# Patient Record
Sex: Male | Born: 1940 | Race: White | Hispanic: No | Marital: Married | Smoking: Former smoker
Health system: Southern US, Community
[De-identification: ages and names within clinical notes are randomized; demographics above are authoritative.]

## PROBLEM LIST (undated history)

## (undated) DIAGNOSIS — I10 Essential (primary) hypertension: Secondary | ICD-10-CM

## (undated) DIAGNOSIS — I509 Heart failure, unspecified: Secondary | ICD-10-CM

## (undated) DIAGNOSIS — M1990 Unspecified osteoarthritis, unspecified site: Secondary | ICD-10-CM

## (undated) DIAGNOSIS — E669 Obesity, unspecified: Secondary | ICD-10-CM

## (undated) DIAGNOSIS — M545 Low back pain, unspecified: Secondary | ICD-10-CM

## (undated) DIAGNOSIS — E119 Type 2 diabetes mellitus without complications: Secondary | ICD-10-CM

## (undated) DIAGNOSIS — G8929 Other chronic pain: Secondary | ICD-10-CM

## (undated) DIAGNOSIS — E785 Hyperlipidemia, unspecified: Secondary | ICD-10-CM

## (undated) HISTORY — PX: BACK SURGERY: SHX140

## (undated) HISTORY — PX: EYE SURGERY: SHX253

## (undated) HISTORY — PX: LUMBAR DISC SURGERY: SHX700

## (undated) HISTORY — PX: APPENDECTOMY: SHX54

## (undated) HISTORY — PX: TONSILLECTOMY: SUR1361

---

## 2008-08-14 ENCOUNTER — Emergency Department (HOSPITAL_COMMUNITY): Admission: EM | Admit: 2008-08-14 | Discharge: 2008-08-14 | Payer: Self-pay | Admitting: Emergency Medicine

## 2017-06-05 ENCOUNTER — Emergency Department (HOSPITAL_COMMUNITY): Payer: Medicare Other

## 2017-06-05 ENCOUNTER — Inpatient Hospital Stay (HOSPITAL_COMMUNITY)
Admission: EM | Admit: 2017-06-05 | Discharge: 2017-07-13 | DRG: 233 | Disposition: E | Payer: Medicare Other | Attending: Cardiothoracic Surgery | Admitting: Cardiothoracic Surgery

## 2017-06-05 ENCOUNTER — Encounter (HOSPITAL_COMMUNITY): Payer: Self-pay | Admitting: Emergency Medicine

## 2017-06-05 DIAGNOSIS — E1142 Type 2 diabetes mellitus with diabetic polyneuropathy: Secondary | ICD-10-CM | POA: Diagnosis not present

## 2017-06-05 DIAGNOSIS — Z992 Dependence on renal dialysis: Secondary | ICD-10-CM

## 2017-06-05 DIAGNOSIS — Z452 Encounter for adjustment and management of vascular access device: Secondary | ICD-10-CM

## 2017-06-05 DIAGNOSIS — I208 Other forms of angina pectoris: Secondary | ICD-10-CM

## 2017-06-05 DIAGNOSIS — Z66 Do not resuscitate: Secondary | ICD-10-CM | POA: Diagnosis not present

## 2017-06-05 DIAGNOSIS — I509 Heart failure, unspecified: Secondary | ICD-10-CM

## 2017-06-05 DIAGNOSIS — Z6841 Body Mass Index (BMI) 40.0 and over, adult: Secondary | ICD-10-CM | POA: Diagnosis not present

## 2017-06-05 DIAGNOSIS — K8020 Calculus of gallbladder without cholecystitis without obstruction: Secondary | ICD-10-CM | POA: Diagnosis present

## 2017-06-05 DIAGNOSIS — J810 Acute pulmonary edema: Secondary | ICD-10-CM | POA: Diagnosis not present

## 2017-06-05 DIAGNOSIS — Z01818 Encounter for other preprocedural examination: Secondary | ICD-10-CM

## 2017-06-05 DIAGNOSIS — G92 Toxic encephalopathy: Secondary | ICD-10-CM | POA: Diagnosis not present

## 2017-06-05 DIAGNOSIS — Z79899 Other long term (current) drug therapy: Secondary | ICD-10-CM

## 2017-06-05 DIAGNOSIS — N170 Acute kidney failure with tubular necrosis: Secondary | ICD-10-CM | POA: Diagnosis not present

## 2017-06-05 DIAGNOSIS — I5021 Acute systolic (congestive) heart failure: Secondary | ICD-10-CM | POA: Diagnosis not present

## 2017-06-05 DIAGNOSIS — R001 Bradycardia, unspecified: Secondary | ICD-10-CM | POA: Diagnosis not present

## 2017-06-05 DIAGNOSIS — J9602 Acute respiratory failure with hypercapnia: Secondary | ICD-10-CM | POA: Diagnosis not present

## 2017-06-05 DIAGNOSIS — E785 Hyperlipidemia, unspecified: Secondary | ICD-10-CM | POA: Diagnosis present

## 2017-06-05 DIAGNOSIS — G8929 Other chronic pain: Secondary | ICD-10-CM | POA: Diagnosis present

## 2017-06-05 DIAGNOSIS — I214 Non-ST elevation (NSTEMI) myocardial infarction: Principal | ICD-10-CM

## 2017-06-05 DIAGNOSIS — J9690 Respiratory failure, unspecified, unspecified whether with hypoxia or hypercapnia: Secondary | ICD-10-CM

## 2017-06-05 DIAGNOSIS — I130 Hypertensive heart and chronic kidney disease with heart failure and stage 1 through stage 4 chronic kidney disease, or unspecified chronic kidney disease: Secondary | ICD-10-CM | POA: Diagnosis not present

## 2017-06-05 DIAGNOSIS — E1122 Type 2 diabetes mellitus with diabetic chronic kidney disease: Secondary | ICD-10-CM | POA: Diagnosis present

## 2017-06-05 DIAGNOSIS — I4892 Unspecified atrial flutter: Secondary | ICD-10-CM | POA: Diagnosis not present

## 2017-06-05 DIAGNOSIS — E669 Obesity, unspecified: Secondary | ICD-10-CM | POA: Diagnosis not present

## 2017-06-05 DIAGNOSIS — Z01810 Encounter for preprocedural cardiovascular examination: Secondary | ICD-10-CM | POA: Diagnosis not present

## 2017-06-05 DIAGNOSIS — Z7984 Long term (current) use of oral hypoglycemic drugs: Secondary | ICD-10-CM

## 2017-06-05 DIAGNOSIS — Z23 Encounter for immunization: Secondary | ICD-10-CM | POA: Diagnosis not present

## 2017-06-05 DIAGNOSIS — Z22321 Carrier or suspected carrier of Methicillin susceptible Staphylococcus aureus: Secondary | ICD-10-CM

## 2017-06-05 DIAGNOSIS — I10 Essential (primary) hypertension: Secondary | ICD-10-CM | POA: Diagnosis present

## 2017-06-05 DIAGNOSIS — N183 Chronic kidney disease, stage 3 unspecified: Secondary | ICD-10-CM

## 2017-06-05 DIAGNOSIS — I255 Ischemic cardiomyopathy: Secondary | ICD-10-CM | POA: Diagnosis present

## 2017-06-05 DIAGNOSIS — G9340 Encephalopathy, unspecified: Secondary | ICD-10-CM | POA: Diagnosis not present

## 2017-06-05 DIAGNOSIS — J189 Pneumonia, unspecified organism: Secondary | ICD-10-CM | POA: Diagnosis not present

## 2017-06-05 DIAGNOSIS — J811 Chronic pulmonary edema: Secondary | ICD-10-CM

## 2017-06-05 DIAGNOSIS — K567 Ileus, unspecified: Secondary | ICD-10-CM

## 2017-06-05 DIAGNOSIS — I472 Ventricular tachycardia: Secondary | ICD-10-CM | POA: Diagnosis not present

## 2017-06-05 DIAGNOSIS — K5900 Constipation, unspecified: Secondary | ICD-10-CM | POA: Diagnosis present

## 2017-06-05 DIAGNOSIS — J9600 Acute respiratory failure, unspecified whether with hypoxia or hypercapnia: Secondary | ICD-10-CM

## 2017-06-05 DIAGNOSIS — Y95 Nosocomial condition: Secondary | ICD-10-CM | POA: Diagnosis not present

## 2017-06-05 DIAGNOSIS — I2510 Atherosclerotic heart disease of native coronary artery without angina pectoris: Secondary | ICD-10-CM | POA: Diagnosis not present

## 2017-06-05 DIAGNOSIS — D62 Acute posthemorrhagic anemia: Secondary | ICD-10-CM | POA: Diagnosis not present

## 2017-06-05 DIAGNOSIS — R680 Hypothermia, not associated with low environmental temperature: Secondary | ICD-10-CM | POA: Diagnosis not present

## 2017-06-05 DIAGNOSIS — R6521 Severe sepsis with septic shock: Secondary | ICD-10-CM | POA: Diagnosis not present

## 2017-06-05 DIAGNOSIS — K7200 Acute and subacute hepatic failure without coma: Secondary | ICD-10-CM | POA: Diagnosis not present

## 2017-06-05 DIAGNOSIS — I6340 Cerebral infarction due to embolism of unspecified cerebral artery: Secondary | ICD-10-CM | POA: Diagnosis not present

## 2017-06-05 DIAGNOSIS — I350 Nonrheumatic aortic (valve) stenosis: Secondary | ICD-10-CM | POA: Diagnosis not present

## 2017-06-05 DIAGNOSIS — Z951 Presence of aortocoronary bypass graft: Secondary | ICD-10-CM | POA: Diagnosis not present

## 2017-06-05 DIAGNOSIS — A419 Sepsis, unspecified organism: Secondary | ICD-10-CM

## 2017-06-05 DIAGNOSIS — R570 Cardiogenic shock: Secondary | ICD-10-CM | POA: Diagnosis not present

## 2017-06-05 DIAGNOSIS — J939 Pneumothorax, unspecified: Secondary | ICD-10-CM

## 2017-06-05 DIAGNOSIS — Z9289 Personal history of other medical treatment: Secondary | ICD-10-CM

## 2017-06-05 DIAGNOSIS — I25110 Atherosclerotic heart disease of native coronary artery with unstable angina pectoris: Secondary | ICD-10-CM | POA: Diagnosis present

## 2017-06-05 DIAGNOSIS — R34 Anuria and oliguria: Secondary | ICD-10-CM | POA: Diagnosis not present

## 2017-06-05 DIAGNOSIS — I9789 Other postprocedural complications and disorders of the circulatory system, not elsewhere classified: Secondary | ICD-10-CM | POA: Diagnosis not present

## 2017-06-05 DIAGNOSIS — R946 Abnormal results of thyroid function studies: Secondary | ICD-10-CM | POA: Diagnosis present

## 2017-06-05 DIAGNOSIS — Y838 Other surgical procedures as the cause of abnormal reaction of the patient, or of later complication, without mention of misadventure at the time of the procedure: Secondary | ICD-10-CM | POA: Diagnosis not present

## 2017-06-05 DIAGNOSIS — Z978 Presence of other specified devices: Secondary | ICD-10-CM

## 2017-06-05 DIAGNOSIS — J9601 Acute respiratory failure with hypoxia: Secondary | ICD-10-CM

## 2017-06-05 DIAGNOSIS — Z4659 Encounter for fitting and adjustment of other gastrointestinal appliance and device: Secondary | ICD-10-CM

## 2017-06-05 DIAGNOSIS — Z91030 Bee allergy status: Secondary | ICD-10-CM

## 2017-06-05 DIAGNOSIS — D631 Anemia in chronic kidney disease: Secondary | ICD-10-CM | POA: Diagnosis present

## 2017-06-05 DIAGNOSIS — Z7982 Long term (current) use of aspirin: Secondary | ICD-10-CM

## 2017-06-05 DIAGNOSIS — E11319 Type 2 diabetes mellitus with unspecified diabetic retinopathy without macular edema: Secondary | ICD-10-CM | POA: Diagnosis present

## 2017-06-05 DIAGNOSIS — Z833 Family history of diabetes mellitus: Secondary | ICD-10-CM

## 2017-06-05 DIAGNOSIS — Z87891 Personal history of nicotine dependence: Secondary | ICD-10-CM

## 2017-06-05 DIAGNOSIS — I4891 Unspecified atrial fibrillation: Secondary | ICD-10-CM | POA: Diagnosis not present

## 2017-06-05 DIAGNOSIS — E874 Mixed disorder of acid-base balance: Secondary | ICD-10-CM | POA: Diagnosis not present

## 2017-06-05 DIAGNOSIS — R0602 Shortness of breath: Secondary | ICD-10-CM

## 2017-06-05 DIAGNOSIS — I272 Pulmonary hypertension, unspecified: Secondary | ICD-10-CM | POA: Diagnosis present

## 2017-06-05 DIAGNOSIS — N189 Chronic kidney disease, unspecified: Secondary | ICD-10-CM | POA: Diagnosis not present

## 2017-06-05 DIAGNOSIS — J90 Pleural effusion, not elsewhere classified: Secondary | ICD-10-CM

## 2017-06-05 DIAGNOSIS — G4733 Obstructive sleep apnea (adult) (pediatric): Secondary | ICD-10-CM | POA: Diagnosis present

## 2017-06-05 DIAGNOSIS — Z419 Encounter for procedure for purposes other than remedying health state, unspecified: Secondary | ICD-10-CM

## 2017-06-05 DIAGNOSIS — Z8249 Family history of ischemic heart disease and other diseases of the circulatory system: Secondary | ICD-10-CM

## 2017-06-05 DIAGNOSIS — I2089 Other forms of angina pectoris: Secondary | ICD-10-CM

## 2017-06-05 DIAGNOSIS — Z5329 Procedure and treatment not carried out because of patient's decision for other reasons: Secondary | ICD-10-CM | POA: Diagnosis not present

## 2017-06-05 HISTORY — DX: Low back pain: M54.5

## 2017-06-05 HISTORY — DX: Heart failure, unspecified: I50.9

## 2017-06-05 HISTORY — DX: Unspecified osteoarthritis, unspecified site: M19.90

## 2017-06-05 HISTORY — DX: Hyperlipidemia, unspecified: E78.5

## 2017-06-05 HISTORY — DX: Obesity, unspecified: E66.9

## 2017-06-05 HISTORY — DX: Type 2 diabetes mellitus without complications: E11.9

## 2017-06-05 HISTORY — DX: Other chronic pain: G89.29

## 2017-06-05 HISTORY — DX: Essential (primary) hypertension: I10

## 2017-06-05 HISTORY — DX: Low back pain, unspecified: M54.50

## 2017-06-05 LAB — BASIC METABOLIC PANEL
ANION GAP: 9 (ref 5–15)
BUN: 24 mg/dL — ABNORMAL HIGH (ref 6–20)
CALCIUM: 8.7 mg/dL — AB (ref 8.9–10.3)
CHLORIDE: 98 mmol/L — AB (ref 101–111)
CO2: 26 mmol/L (ref 22–32)
CREATININE: 1.43 mg/dL — AB (ref 0.61–1.24)
GFR calc non Af Amer: 47 mL/min — ABNORMAL LOW (ref >60)
GFR, EST AFRICAN AMERICAN: 55 mL/min — AB (ref >60)
Glucose, Bld: 193 mg/dL — ABNORMAL HIGH (ref 65–99)
Potassium: 4.8 mmol/L (ref 3.5–5.1)
SODIUM: 133 mmol/L — AB (ref 135–145)

## 2017-06-05 LAB — CBC
HCT: 40.8 % (ref 39.0–52.0)
HEMOGLOBIN: 13.6 g/dL (ref 13.0–17.0)
MCH: 29.4 pg (ref 26.0–34.0)
MCHC: 33.3 g/dL (ref 30.0–36.0)
MCV: 88.3 fL (ref 78.0–100.0)
PLATELETS: 241 10*3/uL (ref 150–400)
RBC: 4.62 MIL/uL (ref 4.22–5.81)
RDW: 14.9 % (ref 11.5–15.5)
WBC: 10.2 10*3/uL (ref 4.0–10.5)

## 2017-06-05 LAB — TROPONIN I: TROPONIN I: 0.72 ng/mL — AB (ref <0.03)

## 2017-06-05 LAB — APTT: APTT: 37 s — AB (ref 24–36)

## 2017-06-05 LAB — BRAIN NATRIURETIC PEPTIDE: B Natriuretic Peptide: 501 pg/mL — ABNORMAL HIGH (ref 0.0–100.0)

## 2017-06-05 LAB — PROTIME-INR
INR: 1.1
PROTHROMBIN TIME: 14.3 s (ref 11.4–15.2)

## 2017-06-05 MED ORDER — ASPIRIN EC 81 MG PO TBEC
81.0000 mg | DELAYED_RELEASE_TABLET | Freq: Every day | ORAL | Status: DC
  Administered 2017-06-06 – 2017-06-08 (×3): 81 mg via ORAL
  Filled 2017-06-05 (×3): qty 1

## 2017-06-05 MED ORDER — LISINOPRIL 10 MG PO TABS
10.0000 mg | ORAL_TABLET | Freq: Every day | ORAL | Status: DC
  Administered 2017-06-06: 10 mg via ORAL
  Filled 2017-06-05: qty 1

## 2017-06-05 MED ORDER — ONDANSETRON HCL 4 MG/2ML IJ SOLN: 4.0000 mg | Freq: Four times a day (QID) | INTRAMUSCULAR | Status: DC | PRN

## 2017-06-05 MED ORDER — GABAPENTIN 300 MG PO CAPS
300.0000 mg | ORAL_CAPSULE | Freq: Two times a day (BID) | ORAL | Status: DC
  Administered 2017-06-06 – 2017-06-08 (×7): 300 mg via ORAL
  Filled 2017-06-05 (×7): qty 1

## 2017-06-05 MED ORDER — ASPIRIN 81 MG PO CHEW
324.0000 mg | CHEWABLE_TABLET | Freq: Once | ORAL | Status: AC
  Administered 2017-06-05: 324 mg via ORAL
  Filled 2017-06-05: qty 4

## 2017-06-05 MED ORDER — SODIUM CHLORIDE 0.9% FLUSH
3.0000 mL | Freq: Two times a day (BID) | INTRAVENOUS | Status: DC
  Administered 2017-06-06 – 2017-06-07 (×2): 3 mL via INTRAVENOUS
  Administered 2017-06-08: 22:00:00 via INTRAVENOUS

## 2017-06-05 MED ORDER — ACETAMINOPHEN 325 MG PO TABS: 650.0000 mg | ORAL_TABLET | ORAL | Status: DC | PRN

## 2017-06-05 MED ORDER — SODIUM CHLORIDE 0.9 % IV SOLN
250.0000 mL | INTRAVENOUS | Status: DC | PRN
  Administered 2017-06-09: 09:00:00 via INTRAVENOUS

## 2017-06-05 MED ORDER — HEPARIN (PORCINE) IN NACL 100-0.45 UNIT/ML-% IJ SOLN
1250.0000 [IU]/h | INTRAMUSCULAR | Status: DC
Start: 2017-06-05 — End: 2017-06-07
  Administered 2017-06-06 – 2017-06-07 (×2): 1250 [IU]/h via INTRAVENOUS
  Filled 2017-06-05 (×2): qty 250

## 2017-06-05 MED ORDER — INSULIN ASPART 100 UNIT/ML ~~LOC~~ SOLN: 0.0000 [IU] | Freq: Three times a day (TID) | SUBCUTANEOUS | Status: DC

## 2017-06-05 MED ORDER — AMLODIPINE BESYLATE 2.5 MG PO TABS
2.5000 mg | ORAL_TABLET | Freq: Every day | ORAL | Status: DC
  Administered 2017-06-06 – 2017-06-08 (×3): 2.5 mg via ORAL
  Filled 2017-06-05 (×3): qty 1

## 2017-06-05 MED ORDER — ADULT MULTIVITAMIN W/MINERALS CH
1.0000 | ORAL_TABLET | Freq: Every day | ORAL | Status: DC
  Administered 2017-06-06 – 2017-06-08 (×3): 1 via ORAL
  Filled 2017-06-05 (×3): qty 1

## 2017-06-05 MED ORDER — INSULIN ASPART 100 UNIT/ML ~~LOC~~ SOLN
0.0000 [IU] | Freq: Three times a day (TID) | SUBCUTANEOUS | Status: DC
  Administered 2017-06-06: 3 [IU] via SUBCUTANEOUS
  Administered 2017-06-06: 4 [IU] via SUBCUTANEOUS
  Administered 2017-06-08: 3 [IU] via SUBCUTANEOUS

## 2017-06-05 MED ORDER — INSULIN GLARGINE 100 UNIT/ML ~~LOC~~ SOLN
70.0000 [IU] | Freq: Every day | SUBCUTANEOUS | Status: DC
  Administered 2017-06-06: 70 [IU] via SUBCUTANEOUS
  Filled 2017-06-05 (×3): qty 0.7

## 2017-06-05 MED ORDER — METOPROLOL TARTRATE 25 MG PO TABS
25.0000 mg | ORAL_TABLET | Freq: Two times a day (BID) | ORAL | Status: DC
  Administered 2017-06-06 – 2017-06-07 (×5): 25 mg via ORAL
  Filled 2017-06-05 (×5): qty 1

## 2017-06-05 MED ORDER — PNEUMOCOCCAL VAC POLYVALENT 25 MCG/0.5ML IJ INJ
0.5000 mL | INJECTION | INTRAMUSCULAR | Status: DC
  Filled 2017-06-05 (×2): qty 0.5

## 2017-06-05 MED ORDER — HEPARIN BOLUS VIA INFUSION
4000.0000 [IU] | Freq: Once | INTRAVENOUS | Status: AC
  Administered 2017-06-05: 4000 [IU] via INTRAVENOUS

## 2017-06-05 MED ORDER — FUROSEMIDE 10 MG/ML IJ SOLN
80.0000 mg | Freq: Once | INTRAMUSCULAR | Status: AC
  Administered 2017-06-05: 80 mg via INTRAVENOUS
  Filled 2017-06-05: qty 8

## 2017-06-05 MED ORDER — FUROSEMIDE 10 MG/ML IJ SOLN
80.0000 mg | Freq: Two times a day (BID) | INTRAMUSCULAR | Status: DC
  Administered 2017-06-06 (×2): 80 mg via INTRAVENOUS
  Filled 2017-06-05 (×3): qty 8

## 2017-06-05 MED ORDER — NITROGLYCERIN IN D5W 200-5 MCG/ML-% IV SOLN
10.0000 ug/min | Freq: Once | INTRAVENOUS | Status: AC
  Administered 2017-06-05: 10 ug/min via INTRAVENOUS
  Filled 2017-06-05: qty 250

## 2017-06-05 MED ORDER — SODIUM CHLORIDE 0.9% FLUSH: 3.0000 mL | INTRAVENOUS | Status: DC | PRN

## 2017-06-05 MED ORDER — NITROGLYCERIN IN D5W 200-5 MCG/ML-% IV SOLN: 10.0000 ug/min | INTRAVENOUS | Status: DC

## 2017-06-05 MED ORDER — FENOFIBRATE 160 MG PO TABS
160.0000 mg | ORAL_TABLET | Freq: Every day | ORAL | Status: DC
  Administered 2017-06-06 – 2017-06-08 (×3): 160 mg via ORAL
  Filled 2017-06-05 (×3): qty 1

## 2017-06-05 MED ORDER — SIMVASTATIN 20 MG PO TABS
20.0000 mg | ORAL_TABLET | Freq: Every day | ORAL | Status: DC
  Administered 2017-06-06: 20 mg via ORAL
  Filled 2017-06-05: qty 1

## 2017-06-05 NOTE — Progress Notes (Signed)
ANTICOAGULATION CONSULT NOTE - Initial Consult  Pharmacy Consult for Heparin Indication: chest pain/ACS  Allergies  Allergen Reactions  . Bee Venom Swelling    Patient Measurements: Height: 5\' 8"  (172.7 cm) Weight: 238 lb 9.6 oz (108.2 kg) IBW/kg (Calculated) : 68.4 Heparin Dosing Weight: HEPARIN DW (KG): 91.1  Vital Signs: Temp Source: Oral (07/25 1725) BP: 145/68 (07/25 1940) Pulse Rate: 69 (07/25 1940)  Labs:  Recent Labs  Dec 27, 2016 1723  HGB 13.6  HCT 40.8  PLT 241  CREATININE 1.43*  TROPONINI 0.72*    Estimated Creatinine Clearance: 54.9 mL/min (A) (by C-G formula based on SCr of 1.43 mg/dL (H)).   Medical History: Past Medical History:  Diagnosis Date  . Essential hypertension   . Hyperlipidemia   . Type 2 diabetes mellitus (HCC)     Prior to Admission medications   Medication Sig Start Date End Date Taking? Authorizing Provider  amLODipine (NORVASC) 2.5 MG tablet Take 2.5 mg by mouth daily.   Yes [provider]  aspirin EC 81 MG tablet Take 81 mg by mouth daily.   Yes [provider]  Carboxymethylcellul-Glycerin (CLEAR EYES FOR DRY EYES OP) Place 1-2 drops into both eyes daily as needed.   Yes [provider]  fenofibrate 160 MG tablet Take 160 mg by mouth daily.   Yes [provider]  gabapentin (NEURONTIN) 300 MG capsule Take 300 mg by mouth 2 (two) times daily.   Yes [provider]  hydrochlorothiazide (HYDRODIURIL) 50 MG tablet Take 50 mg by mouth daily.   Yes [provider]  Boris LownKrill Oil 350 MG CAPS Take 350 mg by mouth daily.   Yes [provider]  lisinopril (PRINIVIL,ZESTRIL) 10 MG tablet Take 10 mg by mouth daily.   Yes [provider]  metoprolol tartrate (LOPRESSOR) 25 MG tablet Take 25 mg by mouth 2 (two) times daily.   Yes [provider]  Multiple Vitamin (MULTIVITAMIN WITH MINERALS) TABS tablet Take 1 tablet by mouth daily.   Yes [provider]  naproxen sodium (ANAPROX) 220 MG tablet Take 440 mg by mouth daily as needed.   Yes [provider]  simvastatin (ZOCOR) 20 MG tablet Take 20 mg by mouth daily.       Assessment: Okay for Protocol, baseline anticoagulation labs pending.  Goal of Therapy:  Heparin level 0.3-0.7 units/ml Monitor platelets by anticoagulation protocol: Yes   Plan:  Give 4000 units bolus x 1 Start heparin infusion at 1250 units/hr Check anti-Xa level in 6-8 hours and daily while on heparin Continue to monitor H&H and platelets  Mady GemmaHayes, Ezekial Arns R 06/14/17,8:13 PM

## 2017-06-05 NOTE — H&P (Signed)
History and Physical   Admit date: 05/25/2017 Name:  Philip Love Medical record number: [id number] DOB/Age:  1940/12/02  76 y.o. male  [facility]   [facility]  Primary Cardiologist:  [facility]  Primary Physician:   Dr. Pearson GrippeJames Kim  Chief complaint/reason for admission: Shortness of breath  HPI:  This 76 year old male has long-standing insulin-dependent diabetes mellitus.  He has peripheral neuropathy and has previous retinopathy.  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  He has long-standing hypertension and hyperlipidemia.  He developed a two-week history of worsening shortness of breath and progressive pedal edema.  He began to have PND and orthopnea and [facility] with increasing shortness of breath and some vague tightness this evening.  He was in heart failure when he presented there in significant respiratory distress and was given intravenous Lasix.  Chest x-ray was consistent with heart failure and a troponin was mildly elevated.  His breathing has improved somewhat but he is still significantly dyspneic.  He has been using some nonsteroidal anti-inflammatory agents at home.  He has been obese for a number of years.  No prior cardiac history   Past Medical History:  Diagnosis Date  . Arthritis    "generalized; elbows; knees" (05/14/2017)  . CHF (congestive heart failure) (HCC) 05/15/2017  . Chronic lower back pain   . Essential hypertension   . Hyperlipidemia   . Obesity (BMI 30-39.9)   . Type 2 diabetes mellitus (HCC)    Past Surgical History:  Procedure Laterality Date  . APPENDECTOMY    . BACK SURGERY    . EYE SURGERY Bilateral ~ 2016   "bleeding veins"  . LUMBAR DISC SURGERY  ~ 2010 or after  . TONSILLECTOMY     Allergies: is allergic to bee venom.   Medications: Prior to Admission medications   Medication Sig Start Date End Date Taking? Authorizing  Provider  amLODipine (NORVASC) 2.5 MG tablet Take 2.5 mg by mouth daily.   Yes [provider]  aspirin EC 81 MG tablet Take 81 mg by mouth daily.   Yes [provider]  Carboxymethylcellul-Glycerin (CLEAR EYES FOR DRY EYES OP) Place 1-2 drops into both eyes daily as needed.   Yes [provider]  fenofibrate 160 MG tablet Take 160 mg by mouth daily.   Yes [provider]  gabapentin (NEURONTIN) 300 MG capsule Take 300 mg by mouth 2 (two) times daily.   Yes [provider]  hydrochlorothiazide (HYDRODIURIL) 50 MG tablet Take 50 mg by mouth daily.   Yes [provider]  Boris LownKrill Oil 350 MG CAPS Take 350 mg by mouth daily.   Yes [provider]  lisinopril (PRINIVIL,ZESTRIL) 10 MG tablet Take 10 mg by mouth daily.   Yes [provider]  metoprolol tartrate (LOPRESSOR) 25 MG tablet Take 25 mg by mouth 2 (two) times daily.   Yes [provider]  Multiple Vitamin (MULTIVITAMIN WITH MINERALS) TABS tablet Take 1 tablet by mouth daily.   Yes [provider]  naproxen sodium (ANAPROX) 220 MG tablet Take 440 mg by mouth daily as needed.   Yes [provider]  simvastatin (ZOCOR) 20 MG tablet Take 20 mg by mouth daily.   Yes [provider]   Family History:  Family Status  Relation Status  . Mother Deceased  . Father Deceased  . Sister Deceased  . Brother Deceased  .  Sister Deceased  . Sister Deceased  . Sister Deceased    Social History:   reports that he quit smoking about 43 years ago. His smoking use included Cigarettes. He has a 3.00 pack-year smoking history. He has never used smokeless tobacco. He reports that he does not drink alcohol or use drugs.   [place].  Currently retired   Review of Systems: Is been obese for many years.  He has had previous eye surgery for diabetic retinopathy as well as bilateral cataract extractions.  He  complains of dyspepsia and also has significant constipation.  He has nocturia frequency and occasional incontinence.  Severe low back pain with sciatica which limits his activities severely.  Numbness in his feet with peripheral neuropathy and also has had a previous ulcer on his feet. Other than as noted above, the remainder of the review of systems is normal  Physical Exam: BP 140/67 (BP Location: Left Arm)   Pulse 71   Temp 98.9 F (37.2 C) (Oral)   Resp 20   Ht 5\' 8"  (1.727 m)   Wt 115.7 kg (255 lb)   SpO2 99%   BMI 38.77 kg/m  General appearance: He is a pleasant severely obese bearded male who is mildly short of breath Head: Normocephalic, without obvious abnormality, atraumatic Eyes: conjunctivae/corneas clear. PERRL, EOM's intact. Fundi not examined Neck: no adenopathy, no carotid bruit, supple, symmetrical, trachea midline and JVD is difficult to assess due to his beard and obesity Lungs: Bilateral rales in the bases Heart: regular rate and rhythm, S1, S2 normal, no murmur, click, rub or gallop Abdomen: soft, non-tender; bowel sounds normal; no masses,  no organomegaly Rectal: deferred Extremities: 2+ peripheral edema in both lower extremities, normal range of motion Pulses: Pedal pulses are present and are one plus no femoral bruits noted Skin: Skin color, texture, turgor normal. No rashes or lesions Neurologic: Grossly normal  Labs: CBC  Recent Labs  05/31/2017 1723  WBC 10.2  RBC 4.62  HGB 13.6  HCT 40.8  PLT 241  MCV 88.3  MCH 29.4  MCHC 33.3  RDW 14.9   CMP   Recent Labs  05/18/2017 1723  NA 133*  K 4.8  CL 98*  CO2 26  GLUCOSE 193*  BUN 24*  CREATININE 1.43*  CALCIUM 8.7*  GFRNONAA 47*  GFRAA 55*   BNP (last 3 results)  Recent Labs  06/06/2017 1807  BNP 501.0*   Cardiac Panel (last 3 results)  Recent Labs  06/03/2017 1723  TROPONINI 0.72*    EKG: Normal sinus rhythm with lateral ischemia, PVCs, one dropped beat noted Independently  reviewed by me  Radiology: Congestive heart failure with basilar opacities   IMPRESSIONS: 1.  Acute congestive heart failure on top of recent chronic shortness of breath 2.  Non-STEMI 3.  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  Hypertension 5.  Obesity 6.  Hyperlipidemia 7.  Chronic kidney disease at least stage III  PLAN: Patient is currently volume overloaded and is still somewhat dyspneic.  We will trend troponins and continue on intravenous heparin and nitroglycerin overnight.  Diurese and watch renal function.  2-D echocardiogram in the morning and when he is stable and renal function is stable consider catheterization.  Signed: Darden PalmerW. Spencer Tilley, Jr. MD [facility]  05/17/2017, 11:34 PM

## 2017-06-05 NOTE — ED Provider Notes (Addendum)
[facility]-EMERGENCY DEPT Provider Note   CSN: [id number] Arrival date & time: 04-01-17  1709     History   Chief Complaint Chief Complaint  Patient presents with  . Shortness of Breath    HPI Philip Love is a 76 y.o. male. Chief complaint is shortness of breath.  HPI:  76 year old male. History of hypertension and non-insulin-dependent diabetes. No history of coronary artery disease, or congestive heart failure.  He describes 2 weeks of progressive shortness of breath now with PND orthopnea. He has had increasing lower extremity edema and a daytime cough. Symptoms have worsened. He had some tightness in his chest for the first several days. This seemed to improve. Now he states he feels "just tight with breathing". No pressure. No crushing anterior pain. No classic anginal symptoms. Symmetric lower extremity edema. No fever. No recent viral infections. Distant history of smoking. No history of COPD.  Past Medical History:  Diagnosis Date  . Essential hypertension   . Hyperlipidemia   . Type 2 diabetes mellitus (HCC)     There are no active problems to display for this patient.   Past Surgical History:  Procedure Laterality Date  . BACK SURGERY         Home Medications    Prior to Admission medications   Medication Sig Start Date End Date Taking? Authorizing Provider  amLODipine (NORVASC) 2.5 MG tablet Take 2.5 mg by mouth daily.   Yes [provider]  aspirin EC 81 MG tablet Take 81 mg by mouth daily.   Yes [provider]  Carboxymethylcellul-Glycerin (CLEAR EYES FOR DRY EYES OP) Place 1-2 drops into both eyes daily as needed.   Yes [provider]  fenofibrate 160 MG tablet Take 160 mg by mouth daily.   Yes [provider]  gabapentin (NEURONTIN) 300 MG capsule Take 300 mg by mouth 2 (two) times daily.   Yes [provider]  hydrochlorothiazide (HYDRODIURIL) 50 MG tablet Take 50 mg by mouth daily.   Yes [provider]  Boris LownKrill Oil 350 MG CAPS Take 350 mg by mouth daily.   Yes [provider]  lisinopril (PRINIVIL,ZESTRIL) 10 MG tablet Take 10 mg by mouth daily.   Yes [provider]  metoprolol tartrate (LOPRESSOR) 25 MG tablet Take 25 mg by mouth 2 (two) times daily.   Yes [provider]  Multiple Vitamin (MULTIVITAMIN WITH MINERALS) TABS tablet Take 1 tablet by mouth daily.   Yes [provider]  naproxen sodium (ANAPROX) 220 MG tablet Take 440 mg by mouth daily as needed.   Yes [provider]  simvastatin (ZOCOR) 20 MG tablet Take 20 mg by mouth daily.   Yes [provider]    Family History History reviewed. No pertinent family history.  Social History Social History  Substance Use Topics  . Smoking status: Never Smoker  . Smokeless tobacco: Not on file  . Alcohol use No     Allergies   Bee venom   Review of Systems Review of Systems  Constitutional: Negative for appetite change, chills, diaphoresis, fatigue and fever.  HENT: Negative for mouth sores, sore throat and trouble swallowing.   Eyes: Negative for visual disturbance.  Respiratory: Positive for cough, chest tightness and shortness of breath. Negative for wheezing.   Cardiovascular: Positive for leg swelling. Negative for chest pain.  Gastrointestinal: Negative for abdominal distention, abdominal pain, diarrhea, nausea and vomiting.  Endocrine: Negative for polydipsia, polyphagia and polyuria.  Genitourinary: Negative for dysuria, frequency  and hematuria.  Musculoskeletal: Negative for gait problem.  Skin: Negative for color change, pallor and rash.  Neurological: Negative for dizziness, syncope, light-headedness and headaches.  Hematological: Does not bruise/bleed easily.  Psychiatric/Behavioral: Negative for behavioral problems and confusion.     Physical Exam Updated Vital Signs BP (!) 149/64   Pulse 61   Resp (!) 25   Ht 5\' 8"  (1.727 m)   Wt 108.2  kg (238 lb 9.6 oz)   SpO2 100%   BMI 36.28 kg/m   Physical Exam  Constitutional: He is oriented to person, place, and time. He appears well-developed and well-nourished. No distress.  76 year old male. Obese. Sitting straight upright. Dyspneic with conversation. 80% on room air. 96% on 2 L.  HENT:  Head: Normocephalic.  Short thick neck, unable to appreciate JVD.  Eyes: Pupils are equal, round, and reactive to light. Conjunctivae are normal. No scleral icterus.  Conjunctiva not pale  Neck: Normal range of motion. Neck supple. No thyromegaly present.  Cardiovascular: Normal rate and regular rhythm.  Exam reveals no gallop and no friction rub.   No murmur heard. Regular. No gallop. Sinus rhythm on the monitor. Symmetric 2+ bilateral lower extremity edema  Pulmonary/Chest: Effort normal and breath sounds normal. No respiratory distress. He has no wheezes. He has no rales.  Diffuse rhonchi and rales to the mid lung. Increased worker breathing with conversation. None at rest.  Abdominal: Soft. Bowel sounds are normal. He exhibits no distension. There is no tenderness. There is no rebound.  Musculoskeletal: Normal range of motion.  Neurological: He is alert and oriented to person, place, and time.  Skin: Skin is warm and dry. No rash noted.  Psychiatric: He has a normal mood and affect. His behavior is normal.     ED Treatments / Results  Labs (all labs ordered are listed, but only abnormal results are displayed) Labs Reviewed  BASIC METABOLIC PANEL - Abnormal; Notable for the following:       Result Value   Sodium 133 (*)    Chloride 98 (*)    Glucose, Bld 193 (*)    BUN 24 (*)    Creatinine, Ser 1.43 (*)    Calcium 8.7 (*)    GFR calc non Af Amer 47 (*)    GFR calc Af Amer 55 (*)    All other components within normal limits  TROPONIN I - Abnormal; Notable for the following:    Troponin I 0.72 (*)    All other components within normal limits  BRAIN NATRIURETIC PEPTIDE -  Abnormal; Notable for the following:    B Natriuretic Peptide 501.0 (*)    All other components within normal limits  CBC    EKG  EKG Interpretation None       Radiology Dg Chest 2 View  Result Date: 05/21/2017 CLINICAL DATA:  Shortness of Breath EXAM: CHEST  2 VIEW COMPARISON:  None. FINDINGS: Cardiac shadow is enlarged. Patchy changes are noted in the lung bases bilaterally consistent with multifocal infiltrate. Some vascular congestion and interstitial edema is noted as well. No sizable effusion is seen. IMPRESSION: Changes of mild CHF with bibasilar opacities. Electronically Signed   By: Alcide CleverMark  Lukens M.D.   On: 05/13/2017 18:19    Procedures Procedures (including critical care time)  Medications Ordered in ED Medications  furosemide (LASIX) injection 80 mg (80 mg Intravenous Given 05/16/2017 1835)  nitroGLYCERIN 50 mg in dextrose 5 % 250 mL (0.2 mg/mL) infusion (10 mcg/min Intravenous New Bag/Given 05/13/2017  1843)     Initial Impression / Assessment and Plan / ED Course  I have reviewed the triage vital signs and the nursing notes.  Pertinent labs & imaging results that were available during my care of the patient were reviewed by me and considered in my medical decision making (see chart for details).   symptoms and findings consistent for MI and acute congestive heart failure. His EKG shows poor R-wave progression in anterior Q-wave. He has T-wave inversions 1, and aVL. He has no acute ST elevations or depressions. He is in a sinus rhythm. Chest x-ray shows pulmonary edema. His given Lasix 80 IV. Started on nitroglycerin drip at 10 mics. His daily aspirin 81 mg. He was given 324 here. Given heparin bolus and infusion. I discussed the case with cardiology on call Dr. Simona Huh. He agrees with admission and transferred to [facility] for treatment of his acute congestive heart failure and eventual angiogram. This was explained in detail the patient.  CRITICAL CARE Performed  by: Rolland Porter JOSEPH   Total critical care time: 30 minutes  Critical care time was exclusive of separately billable procedures and treating other patients.  Critical care was necessary to treat or prevent imminent or life-threatening deterioration.  Critical care was time spent personally by me on the following activities: development of treatment plan with patient and/or surrogate as well as nursing, discussions with consultants, evaluation of patient's response to treatment, examination of patient, obtaining history from patient or surrogate, ordering and performing treatments and interventions, ordering and review of laboratory studies, ordering and review of radiographic studies, pulse oximetry and re-evaluation of patient's condition.   Final Clinical Impressions(s) / ED Diagnoses   Final diagnoses:  NSTEMI (non-ST elevated myocardial infarction) (HCC)  Acute congestive heart failure, unspecified heart failure type [facility])    New Prescriptions New Prescriptions   No medications on file     Rolland Porter, MD 05/22/2017 Serena Croissant    Rolland Porter, MD 05/29/2017 2148

## 2017-06-05 NOTE — ED Triage Notes (Signed)
Pt reports swelling in feet and legs increasing over the past few days with progressive shortness of breath, cough, and tightness in chest.

## 2017-06-05 NOTE — ED Notes (Signed)
CRITICAL VALUE ALERT  Critical Value:  Troponin 0.72  Date & Time Notied:  05/27/2017 at 1825  Provider Notified: Rolland PorterMark James  Orders Received/Actions taken: MD made aware

## 2017-06-06 ENCOUNTER — Inpatient Hospital Stay (HOSPITAL_COMMUNITY): Payer: Medicare Other

## 2017-06-06 DIAGNOSIS — I214 Non-ST elevation (NSTEMI) myocardial infarction: Principal | ICD-10-CM

## 2017-06-06 DIAGNOSIS — I509 Heart failure, unspecified: Secondary | ICD-10-CM

## 2017-06-06 LAB — GLUCOSE, CAPILLARY
GLUCOSE-CAPILLARY: 154 mg/dL — AB (ref 65–99)
GLUCOSE-CAPILLARY: 142 mg/dL — AB (ref 65–99)
GLUCOSE-CAPILLARY: 135 mg/dL — AB (ref 65–99)
Glucose-Capillary: 170 mg/dL — ABNORMAL HIGH (ref 65–99)
Glucose-Capillary: 121 mg/dL — ABNORMAL HIGH (ref 65–99)

## 2017-06-06 LAB — CBC
HEMATOCRIT: 36.3 % — AB (ref 39.0–52.0)
Hemoglobin: 12 g/dL — ABNORMAL LOW (ref 13.0–17.0)
MCH: 29.3 pg (ref 26.0–34.0)
MCHC: 33.1 g/dL (ref 30.0–36.0)
MCV: 88.5 fL (ref 78.0–100.0)
PLATELETS: 206 10*3/uL (ref 150–400)
RBC: 4.1 MIL/uL — ABNORMAL LOW (ref 4.22–5.81)
RDW: 15.2 % (ref 11.5–15.5)
WBC: 8.6 10*3/uL (ref 4.0–10.5)

## 2017-06-06 LAB — ECHOCARDIOGRAM COMPLETE
HEIGHTINCHES: 68 in
Weight: 4080 oz

## 2017-06-06 LAB — BASIC METABOLIC PANEL
Anion gap: 7 (ref 5–15)
BUN: 24 mg/dL — AB (ref 6–20)
CALCIUM: 8.5 mg/dL — AB (ref 8.9–10.3)
CO2: 29 mmol/L (ref 22–32)
CREATININE: 1.77 mg/dL — AB (ref 0.61–1.24)
Chloride: 98 mmol/L — ABNORMAL LOW (ref 101–111)
GFR calc non Af Amer: 36 mL/min — ABNORMAL LOW (ref >60)
GFR, EST AFRICAN AMERICAN: 42 mL/min — AB (ref >60)
Glucose, Bld: 152 mg/dL — ABNORMAL HIGH (ref 65–99)
Potassium: 4.6 mmol/L (ref 3.5–5.1)
Sodium: 134 mmol/L — ABNORMAL LOW (ref 135–145)

## 2017-06-06 LAB — TROPONIN I
Troponin I: 1.49 ng/mL (ref <0.03)
Troponin I: 1.46 ng/mL (ref <0.03)
Troponin I: 1.16 ng/mL (ref <0.03)

## 2017-06-06 LAB — T4, FREE: Free T4: 1.18 ng/dL — ABNORMAL HIGH (ref 0.61–1.12)

## 2017-06-06 LAB — HEPARIN LEVEL (UNFRACTIONATED): HEPARIN UNFRACTIONATED: 0.38 [IU]/mL (ref 0.30–0.70)

## 2017-06-06 LAB — TSH: TSH: 4.708 u[IU]/mL — ABNORMAL HIGH (ref 0.350–4.500)

## 2017-06-06 LAB — MRSA PCR SCREENING: MRSA BY PCR: NEGATIVE

## 2017-06-06 MED ORDER — PERFLUTREN LIPID MICROSPHERE
INTRAVENOUS | Status: AC
  Filled 2017-06-06: qty 10

## 2017-06-06 MED ORDER — PERFLUTREN LIPID MICROSPHERE
1.0000 mL | INTRAVENOUS | Status: AC | PRN
  Administered 2017-06-06: 2 mL via INTRAVENOUS
  Filled 2017-06-06: qty 10

## 2017-06-06 MED ORDER — SODIUM CHLORIDE 0.9 % IV SOLN: INTRAVENOUS | Status: DC

## 2017-06-06 MED ORDER — SODIUM CHLORIDE 0.9 % IV SOLN: 250.0000 mL | INTRAVENOUS | Status: DC | PRN

## 2017-06-06 MED ORDER — SODIUM CHLORIDE 0.9% FLUSH: 3.0000 mL | INTRAVENOUS | Status: DC | PRN

## 2017-06-06 MED ORDER — ASPIRIN 81 MG PO CHEW
81.0000 mg | CHEWABLE_TABLET | ORAL | Status: DC
  Filled 2017-06-06: qty 1

## 2017-06-06 MED ORDER — SODIUM CHLORIDE 0.9% FLUSH
3.0000 mL | Freq: Two times a day (BID) | INTRAVENOUS | Status: DC
  Administered 2017-06-06 – 2017-06-07 (×2): 3 mL via INTRAVENOUS

## 2017-06-06 NOTE — Progress Notes (Signed)
  Echocardiogram 2D Echocardiogram has been performed.  Philip Love, Philip Love 06/06/2017, 12:53 PM

## 2017-06-06 NOTE — Progress Notes (Signed)
Chaplain stopped by while rounding and introduced herself to the patient.  Chaplain shared with patient about Spiritual Care Services.  Patient says he appreciates me stopping by and he states he is feeling some better.  Chaplain provided ministry of presence and support for patient.    06/06/17 0919  Clinical Encounter Type  Visited With Patient  Visit Type Initial;Spiritual support;Pre-op (Patient says he is having a catheter placement)

## 2017-06-06 NOTE — Plan of Care (Signed)
Problem: Safety: Goal: Ability to remain free from injury will improve Outcome: Progressing Pt aware of call bell and fall policy. Demonstrates use of call light. Reports use or cane for assistance at home. Educated pt on risk for bleeding with heparin gtt. Pt reports understanding.  Problem: Pain Managment: Goal: General experience of comfort will improve Outcome: Progressing Pt reports improvement in pain down to 4-5/10. Reports a pressure, but gradually improving. Pt has increased Pressure and SOB when laying more flat. Improved w/ sitting up in bed. Nitro gtt and heparin gtt currently running. Continue to monitor.   Problem: Physical Regulation: Goal: Ability to maintain clinical measurements within normal limits will improve Outcome: Progressing VSS throughout shift. CP gradually improving. Nitro and heparin gtt infusing. Medications administered. Troponin monitored. MD made aware of values. Pt educated on worsening s/s.  Problem: Fluid Volume: Goal: Ability to maintain a balanced intake and output will improve Outcome: Progressing Pt has Bilat LE swelling. Weight performed. Lasix given as ordered. Pt reports some orthopenea while in bed, HOB raised > 30. Pt reports relief. Pt on 2L [place] for comfort. Accurate I/O recorded. Continue to monitor.

## 2017-06-06 NOTE — Progress Notes (Addendum)
Patient Name: Philip Love Date of Encounter: 06/06/2017  Primary Cardiologist: New (lives in [place])  [facility]     Principal Problem:   Acute congestive heart failure [facility](HCC) Active Problems:   NSTEMI (non-ST elevated myocardial infarction) (HCC)   Essential hypertension   Obesity (BMI 30-39.9)   Type 2 diabetes mellitus with peripheral neuropathy (HCC)   Hyperlipidemia   Kidney disease, chronic, stage III (GFR 30-59 ml/min)     Subjective   Still with some mild chest tightness and SOB. Did not sleep well last night. Still has orthopnea and PND. Sitting up in arm chair currently   Inpatient Medications    Scheduled Meds: . amLODipine  2.5 mg Oral Daily  . aspirin EC  81 mg Oral Daily  . fenofibrate  160 mg Oral Daily  . furosemide  80 mg Intravenous Q12H  . gabapentin  300 mg Oral BID  . insulin aspart  0-20 Units Subcutaneous TID WC  . insulin glargine  70 Units Subcutaneous Daily  . lisinopril  10 mg Oral Daily  . metoprolol tartrate  25 mg Oral BID  . multivitamin with minerals  1 tablet Oral Daily  . pneumococcal 23 valent vaccine  0.5 mL Intramuscular Tomorrow-1000  . simvastatin  20 mg Oral q1800  . sodium chloride flush  3 mL Intravenous Q12H   Continuous Infusions: . sodium chloride    . heparin    . nitroGLYCERIN     PRN Meds: sodium chloride, acetaminophen, ondansetron (ZOFRAN) IV, sodium chloride flush   Vital Signs    Vitals:   05/23/2017 2200 06/06/17 0000 06/06/17 0428 06/06/17 0738  BP: 140/67 120/73 (!) 99/51 126/66  Pulse: 71 72 60 66  Resp: 20  (!) 28 (!) 21  Temp: 98.9 F (37.2 C)  97.7 F (36.5 C) 97.9 F (36.6 C)  TempSrc: Oral  Oral Oral  SpO2: 99%  96% 97%  Weight: 255 lb (115.7 kg)     Height:        Intake/Output Summary (Last 24 hours) at 06/06/17 0741 Last data filed at 06/06/17 0400  Gross per 24 hour  Intake            78.65 ml  Output                0 ml  Net            78.65 ml   Filed Weights   06/04/2017 1722 05/17/2017 1817 05/31/2017 2200  Weight: 230 lb (104.3 kg) 238 lb 9.6 oz (108.2 kg) 255 lb (115.7 kg)    Physical Exam   GEN: Well nourished, well developed, in no acute distress. obese HEENT: Grossly normal.  Neck: Supple, no JVD, carotid bruits, or masses. Cardiac: RRR, no murmurs, rubs, or gallops. No clubbing, cyanosis, 2+ bilateral pitting edema.  Radials/DP/PT 2+ and equal bilaterally.  Respiratory:  Crackles up to bases. GI: Soft, nontender, nondistended, BS + x 4. MS: no deformity or atrophy. Skin: warm and dry, no rash. Neuro:  Strength and sensation are intact. Psych: AAOx3.  Normal affect.  Labs    CBC  Recent Labs  05/29/2017 1723  WBC 10.2  HGB 13.6  HCT 40.8  MCV 88.3  PLT 241   Basic Metabolic Panel  Recent Labs  05/16/2017 1723  NA 133*  K 4.8  CL 98*  CO2 26  GLUCOSE 193*  BUN 24*  CREATININE 1.43*  CALCIUM 8.7*   Liver Function Tests No results for  input(s): AST, ALT, ALKPHOS, BILITOT, PROT, ALBUMIN in the last 72 hours. No results for input(s): LIPASE, AMYLASE in the last 72 hours. Cardiac Enzymes  Recent Labs  05/30/2017 1723 05/28/2017 2351 06/06/17 0526  TROPONINI 0.72* 1.49* 1.46*   BNP Invalid input(s): POCBNP D-Dimer No results for input(s): DDIMER in the last 72 hours. Hemoglobin A1C No results for input(s): HGBA1C in the last 72 hours. Fasting Lipid Panel No results for input(s): CHOL, HDL, LDLCALC, TRIG, CHOLHDL, LDLDIRECT in the last 72 hours. Thyroid Function Tests  Recent Labs  05/26/2017 2357  TSH 4.708*    Telemetry    Sinus with frequent PACs and PVCs - Personally Reviewed  ECG    Sinus with PAC/PVCs and non specific ST/TW changes - Personally Reviewed  Radiology    Dg Chest 2 View  Result Date: 05/15/2017 CLINICAL DATA:  Shortness of Breath EXAM: CHEST  2 VIEW COMPARISON:  None. FINDINGS: Cardiac shadow is enlarged. Patchy changes are noted in the lung bases bilaterally consistent with multifocal  infiltrate. Some vascular congestion and interstitial edema is noted as well. No sizable effusion is seen. IMPRESSION: Changes of mild CHF with bibasilar opacities. Electronically Signed   By: Alcide CleverMark  Love M.D.   On: 05/21/2017 18:19    Cardiac Studies   Echo and cath pending.   Patient Profile     Philip Love is a 76 y.o. male with a history of chronic LBP, HTN, HLD, obesity, CKD stage III and prior cardiac history who [facility]/2017[facility] for CHF and NSTEMI.    Assessment & Plan    Acute CHF: 2D ECHO pending for assessment of LVEF. Still volume overloaded with LE edema, crackles on lung exam and orthopnea/PND. I/Os positive. Weights appear inaccurate. On IV lasix 80mg  BID and says that he is urinating quite a bit. Will continue this current dose and continue to monitor.   NSTEMI: troponin 1.49--> 1.46. Plan is for heart cath when volume status more optimized. Will plan for cath tomorrow at 1:30 with Philip Love if renal function permits (orders placed)  HTN: BP currently well controlled   CKD: creat unclear baseline but creat 1.77 today. Continue to monitor with diuresis   Elevated TSH: mildly elevated at 4.708. Will check a free T4.   IDDM: continue SSI  Morbid obesity: Body mass index is 38.77 kg/m.  Signed, Philip CrockKathryn Thompson, PA-C  06/06/2017, 7:41 AM   Pt seen and examined  I agree with findings as noted by Philip Love above Pt admitted last night with SOB and edema  Trop elevated at 1.49 He is diuresing but still with evid of volume increase  Cannot lie flat yet Echo pending   On  Exam:   JVP normal  Lungs raltes at bases  Cardiac exam RRR  No S3  Ext with 1+ edema  I would continue diuresis for now  Need to optimize Plan for R and L heart cath when volume status improved.  FOllow BP   Check T3, T4    Philip Love

## 2017-06-06 NOTE — Progress Notes (Signed)
ANTICOAGULATION CONSULT NOTE - Follow Up Consult  Pharmacy Consult for Heparin Indication: chest pain/ACS  Allergies  Allergen Reactions  . Bee Venom Swelling    Patient Measurements: Height: 5\' 8"  (172.7 cm) Weight: 255 lb (115.7 kg) IBW/kg (Calculated) : 68.4 Heparin Dosing Weight:  91.1 kg  Vital Signs: Temp: 97.9 F (36.6 C) (07/26 0738) Temp Source: Oral (07/26 0738) BP: 126/66 (07/26 0738) Pulse Rate: 66 (07/26 0738)  Labs:  Recent Labs  06/09/2017 1723 06/09/2017 2351 06/06/17 0526 06/06/17 0714  HGB 13.6  --   --  12.0*  HCT 40.8  --   --  36.3*  PLT 241  --   --  206  APTT 37*  --   --   --   LABPROT 14.3  --   --   --   INR 1.10  --   --   --   HEPARINUNFRC  --   --   --  0.38  CREATININE 1.43*  --   --  1.77*  TROPONINI 0.72* 1.49* 1.46*  --     Estimated Creatinine Clearance: 45.9 mL/min (A) (by C-G formula based on SCr of 1.77 mg/dL (H)).   Assessment:  Anticoag: Hep for ACS. Troponins elevated. HL 0.38. Hgb 13.6>12. Plts 241>206.  Goal of Therapy:  Heparin level 0.3-0.7 units/ml Monitor platelets by anticoagulation protocol: Yes   Plan:  Continue IV heparin 1250 units/hr Continue to monitor H&H and platelets F/u Echo  Marissia Blackham S. Merilynn Finlandobertson, PharmD, BCPS Clinical Staff Pharmacist Pager [phone]  Misty Stanleyobertson, Lenna Hagarty Stillinger 06/06/2017,11:08 AM

## 2017-06-06 NOTE — Progress Notes (Addendum)
Troponin 1.49. MD aware of initial elevated Troponin. Pt currently reports improved CP. IV heparin and Nitro currently running. Will continue to monitor s/s. MD made aware of of level. No new orders at this time.

## 2017-06-06 NOTE — Progress Notes (Signed)
Heart Failure Navigator Consult Note  Presentation: Philip DutchRobert Love is a 76 year old male has long-standing insulin-dependent diabetes mellitus.  He has peripheral neuropathy and has previous retinopathy.  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  He has long-standing hypertension and hyperlipidemia.  He developed a two-week history of worsening shortness of breath and progressive pedal edema.  He began to have PND and orthopnea and [facility] with increasing shortness of breath and some vague tightness this evening.  He was in heart failure when he presented there in significant respiratory distress and was given intravenous Lasix.  Chest x-ray was consistent with heart failure and a troponin was mildly elevated.  His breathing has improved somewhat but he is still significantly dyspneic.  He has been using some nonsteroidal anti-inflammatory agents at home.  He has been obese for a number of years.  No prior cardiac history.  Past Medical History:  Diagnosis Date  . Arthritis    "generalized; elbows; knees" (05/30/2017)  . CHF (congestive heart failure) (HCC) 05/26/2017  . Chronic lower back pain   . Essential hypertension   . Hyperlipidemia   . Obesity (BMI 30-39.9)   . Type 2 diabetes mellitus (HCC)     Social History   Social History  . Marital status: Married    Spouse name: N/A  . Number of children: N/A  . Years of education: N/A   Social History Main Topics  . Smoking status: Former Smoker    Packs/day: 0.50    Years: 6.00    Types: Cigarettes    Quit date: [id number]  . Smokeless tobacco: Never Used  . Alcohol use No  . Drug use: No  . Sexual activity: Not Asked   Other Topics Concern  . None   Social History Narrative  . None    ECHO: pending  BNP    Component Value Date/Time   BNP 501.0 (H) 06/02/2017 1807    ProBNP No results found for: PROBNP   Education Assessment and  Provision:  Detailed education and instructions provided on heart failure disease management including the following:  Signs and symptoms of Heart Failure When to call the physician Importance of daily weights Low sodium diet Fluid restriction Medication management Anticipated future follow-up appointments  Patient education given on each of the above topics.  Patient acknowledges understanding and acceptance of all instructions.  I spoke with Mr. Philip Love and wife regarding his current hospitalization and new Heart Failure diagnosis.  He is scheduled for a cardiac catheterization tomorrow.  We spoke about the differences in heart disease and causes of Heart Failure.  I reviewed the importance of daily weights and when to contact the physician.  I briefly reviewed a low sodium diet and high sodium foods to avoid.  He denies any foreseen issue with getting or taking prescribed medications.  He lives in [place] with his wife and son.  He plans to return to home with his family.  Education Materials:  "Living Better With Heart Failure" Booklet, Daily Weight Tracker Tool    High Risk Criteria for Readmission and/or Poor Patient Outcomes:    EF <30%- Pending  2 or more admissions in 6 months- No  Difficult social situation- No -denies  Demonstrates medication noncompliance-No  Barriers of Care:  Knowledge of HF and ongoing compliance  Discharge Planning:   Plans to return to [place] with family.

## 2017-06-07 ENCOUNTER — Encounter (HOSPITAL_COMMUNITY): Admission: EM | Disposition: E | Payer: Self-pay | Source: Home / Self Care | Attending: Cardiothoracic Surgery

## 2017-06-07 ENCOUNTER — Other Ambulatory Visit: Payer: Self-pay | Admitting: *Deleted

## 2017-06-07 DIAGNOSIS — I5021 Acute systolic (congestive) heart failure: Secondary | ICD-10-CM

## 2017-06-07 DIAGNOSIS — I2510 Atherosclerotic heart disease of native coronary artery without angina pectoris: Secondary | ICD-10-CM

## 2017-06-07 DIAGNOSIS — N189 Chronic kidney disease, unspecified: Secondary | ICD-10-CM

## 2017-06-07 DIAGNOSIS — I25118 Atherosclerotic heart disease of native coronary artery with other forms of angina pectoris: Secondary | ICD-10-CM

## 2017-06-07 DIAGNOSIS — I10 Essential (primary) hypertension: Secondary | ICD-10-CM

## 2017-06-07 HISTORY — PX: RIGHT/LEFT HEART CATH AND CORONARY ANGIOGRAPHY: CATH118266

## 2017-06-07 LAB — BASIC METABOLIC PANEL
ANION GAP: 7 (ref 5–15)
ANION GAP: 11 (ref 5–15)
Anion gap: 10 (ref 5–15)
BUN: 33 mg/dL — ABNORMAL HIGH (ref 6–20)
BUN: 35 mg/dL — ABNORMAL HIGH (ref 6–20)
BUN: 34 mg/dL — AB (ref 6–20)
CALCIUM: 8.6 mg/dL — AB (ref 8.9–10.3)
CALCIUM: 8.6 mg/dL — AB (ref 8.9–10.3)
CO2: 29 mmol/L (ref 22–32)
CO2: 28 mmol/L (ref 22–32)
CO2: 28 mmol/L (ref 22–32)
CREATININE: 1.96 mg/dL — AB (ref 0.61–1.24)
Calcium: 8.4 mg/dL — ABNORMAL LOW (ref 8.9–10.3)
Chloride: 96 mmol/L — ABNORMAL LOW (ref 101–111)
Chloride: 94 mmol/L — ABNORMAL LOW (ref 101–111)
Chloride: 95 mmol/L — ABNORMAL LOW (ref 101–111)
Creatinine, Ser: 2.05 mg/dL — ABNORMAL HIGH (ref 0.61–1.24)
Creatinine, Ser: 2.07 mg/dL — ABNORMAL HIGH (ref 0.61–1.24)
GFR, EST AFRICAN AMERICAN: 35 mL/min — AB (ref >60)
GFR, EST AFRICAN AMERICAN: 35 mL/min — AB (ref >60)
GFR, EST AFRICAN AMERICAN: 37 mL/min — AB (ref >60)
GFR, EST NON AFRICAN AMERICAN: 30 mL/min — AB (ref >60)
GFR, EST NON AFRICAN AMERICAN: 30 mL/min — AB (ref >60)
GFR, EST NON AFRICAN AMERICAN: 32 mL/min — AB (ref >60)
Glucose, Bld: 98 mg/dL (ref 65–99)
Glucose, Bld: 108 mg/dL — ABNORMAL HIGH (ref 65–99)
Glucose, Bld: 93 mg/dL (ref 65–99)
POTASSIUM: 4.5 mmol/L (ref 3.5–5.1)
Potassium: 5.1 mmol/L (ref 3.5–5.1)
Potassium: 5.7 mmol/L — ABNORMAL HIGH (ref 3.5–5.1)
SODIUM: 132 mmol/L — AB (ref 135–145)
SODIUM: 133 mmol/L — AB (ref 135–145)
SODIUM: 133 mmol/L — AB (ref 135–145)

## 2017-06-07 LAB — POCT I-STAT 3, ART BLOOD GAS (G3+)
Acid-Base Excess: 1 mmol/L (ref 0.0–2.0)
Acid-Base Excess: 2 mmol/L (ref 0.0–2.0)
BICARBONATE: 28.5 mmol/L — AB (ref 20.0–28.0)
Bicarbonate: 26.6 mmol/L (ref 20.0–28.0)
O2 SAT: 95 %
O2 Saturation: 69 %
PCO2 ART: 46.2 mmHg (ref 32.0–48.0)
PCO2 ART: 52.8 mmHg — AB (ref 32.0–48.0)
PH ART: 7.341 — AB (ref 7.350–7.450)
PO2 ART: 78 mmHg — AB (ref 83.0–108.0)
TCO2: 30 mmol/L (ref 0–100)
TCO2: 28 mmol/L (ref 0–100)
pH, Arterial: 7.369 (ref 7.350–7.450)
pO2, Arterial: 39 mmHg — CL (ref 83.0–108.0)

## 2017-06-07 LAB — HEMOGLOBIN A1C
HEMOGLOBIN A1C: 6.4 % — AB (ref 4.8–5.6)
MEAN PLASMA GLUCOSE: 137 mg/dL

## 2017-06-07 LAB — URINALYSIS, ROUTINE W REFLEX MICROSCOPIC
Bilirubin Urine: NEGATIVE
Glucose, UA: NEGATIVE mg/dL
Hgb urine dipstick: NEGATIVE
Ketones, ur: NEGATIVE mg/dL
Leukocytes, UA: NEGATIVE
Nitrite: NEGATIVE
Protein, ur: NEGATIVE mg/dL
Specific Gravity, Urine: 1.013 (ref 1.005–1.030)
pH: 6 (ref 5.0–8.0)

## 2017-06-07 LAB — GLUCOSE, CAPILLARY
GLUCOSE-CAPILLARY: 103 mg/dL — AB (ref 65–99)
GLUCOSE-CAPILLARY: 89 mg/dL (ref 65–99)
GLUCOSE-CAPILLARY: 70 mg/dL (ref 65–99)
Glucose-Capillary: 154 mg/dL — ABNORMAL HIGH (ref 65–99)

## 2017-06-07 LAB — CBC
HCT: 37.4 % — ABNORMAL LOW (ref 39.0–52.0)
Hemoglobin: 12.4 g/dL — ABNORMAL LOW (ref 13.0–17.0)
MCH: 28.5 pg (ref 26.0–34.0)
MCHC: 33.2 g/dL (ref 30.0–36.0)
MCV: 86 fL (ref 78.0–100.0)
PLATELETS: 220 10*3/uL (ref 150–400)
RBC: 4.35 MIL/uL (ref 4.22–5.81)
RDW: 14.7 % (ref 11.5–15.5)
WBC: 11.5 10*3/uL — AB (ref 4.0–10.5)

## 2017-06-07 LAB — HEPARIN LEVEL (UNFRACTIONATED): Heparin Unfractionated: 0.39 IU/mL (ref 0.30–0.70)

## 2017-06-07 SURGERY — RIGHT/LEFT HEART CATH AND CORONARY ANGIOGRAPHY
Anesthesia: LOCAL

## 2017-06-07 MED ORDER — ORAL CARE MOUTH RINSE
15.0000 mL | Freq: Two times a day (BID) | OROMUCOSAL | Status: DC
  Administered 2017-06-07 – 2017-06-08 (×2): 15 mL via OROMUCOSAL

## 2017-06-07 MED ORDER — HEPARIN (PORCINE) IN NACL 2-0.9 UNIT/ML-% IJ SOLN
INTRAMUSCULAR | Status: AC
  Filled 2017-06-07: qty 1000

## 2017-06-07 MED ORDER — FENTANYL CITRATE (PF) 100 MCG/2ML IJ SOLN
INTRAMUSCULAR | Status: DC | PRN
  Administered 2017-06-07: 25 ug via INTRAVENOUS

## 2017-06-07 MED ORDER — INSULIN GLARGINE 100 UNIT/ML ~~LOC~~ SOLN
70.0000 [IU] | Freq: Every day | SUBCUTANEOUS | Status: DC
  Administered 2017-06-08: 70 [IU] via SUBCUTANEOUS
  Filled 2017-06-07 (×2): qty 0.7

## 2017-06-07 MED ORDER — HEPARIN (PORCINE) IN NACL 100-0.45 UNIT/ML-% IJ SOLN
1400.0000 [IU]/h | INTRAMUSCULAR | Status: DC
  Administered 2017-06-07: 1250 [IU]/h via INTRAVENOUS
  Filled 2017-06-07 (×2): qty 250

## 2017-06-07 MED ORDER — SODIUM CHLORIDE 0.9% FLUSH: 3.0000 mL | INTRAVENOUS | Status: DC | PRN

## 2017-06-07 MED ORDER — LIDOCAINE HCL (PF) 1 % IJ SOLN
INTRAMUSCULAR | Status: DC | PRN
  Administered 2017-06-07 (×2): 2 mL

## 2017-06-07 MED ORDER — ATORVASTATIN CALCIUM 80 MG PO TABS
80.0000 mg | ORAL_TABLET | Freq: Every day | ORAL | Status: DC
  Administered 2017-06-07 – 2017-06-08 (×2): 80 mg via ORAL
  Filled 2017-06-07 (×2): qty 1

## 2017-06-07 MED ORDER — FENTANYL CITRATE (PF) 100 MCG/2ML IJ SOLN
INTRAMUSCULAR | Status: AC
  Filled 2017-06-07: qty 2

## 2017-06-07 MED ORDER — HEPARIN SODIUM (PORCINE) 1000 UNIT/ML IJ SOLN
INTRAMUSCULAR | Status: AC
  Filled 2017-06-07: qty 1

## 2017-06-07 MED ORDER — HEPARIN (PORCINE) IN NACL 2-0.9 UNIT/ML-% IJ SOLN
INTRAMUSCULAR | Status: AC | PRN
  Administered 2017-06-07: 1000 mL

## 2017-06-07 MED ORDER — SODIUM CHLORIDE 0.9% FLUSH
3.0000 mL | Freq: Two times a day (BID) | INTRAVENOUS | Status: DC
  Administered 2017-06-07: 3 mL via INTRAVENOUS
  Administered 2017-06-08: 10 mL via INTRAVENOUS
  Administered 2017-06-08: 3 mL via INTRAVENOUS

## 2017-06-07 MED ORDER — LIDOCAINE HCL (PF) 1 % IJ SOLN
INTRAMUSCULAR | Status: AC
  Filled 2017-06-07: qty 30

## 2017-06-07 MED ORDER — SODIUM CHLORIDE 0.9 % IV SOLN: 250.0000 mL | INTRAVENOUS | Status: DC | PRN

## 2017-06-07 MED ORDER — SODIUM CHLORIDE 0.9 % IV SOLN
INTRAVENOUS | Status: DC
  Administered 2017-06-07: 10:00:00 via INTRAVENOUS
  Administered 2017-06-08: 75 mL/h via INTRAVENOUS

## 2017-06-07 MED ORDER — HEPARIN SODIUM (PORCINE) 1000 UNIT/ML IJ SOLN
INTRAMUSCULAR | Status: DC | PRN
  Administered 2017-06-07: 5000 [IU] via INTRAVENOUS

## 2017-06-07 MED ORDER — HEPARIN (PORCINE) IN NACL 2-0.9 UNIT/ML-% IJ SOLN
INTRAMUSCULAR | Status: DC | PRN
  Administered 2017-06-07: 10 mL via INTRA_ARTERIAL

## 2017-06-07 MED ORDER — VERAPAMIL HCL 2.5 MG/ML IV SOLN
INTRAVENOUS | Status: AC
  Filled 2017-06-07: qty 2

## 2017-06-07 MED ORDER — MIDAZOLAM HCL 2 MG/2ML IJ SOLN
INTRAMUSCULAR | Status: AC
  Filled 2017-06-07: qty 2

## 2017-06-07 MED ORDER — IOPAMIDOL (ISOVUE-370) INJECTION 76%
INTRAVENOUS | Status: DC | PRN
  Administered 2017-06-07: 45 mL via INTRA_ARTERIAL

## 2017-06-07 MED ORDER — MIDAZOLAM HCL 2 MG/2ML IJ SOLN
INTRAMUSCULAR | Status: DC | PRN
  Administered 2017-06-07: 1 mg via INTRAVENOUS

## 2017-06-07 SURGICAL SUPPLY — 13 items
CATH BALLN WEDGE 5F 110CM (CATHETERS) ×2 IMPLANT
CATH EXPO 5FR FR4 (CATHETERS) ×2 IMPLANT
CATH INFINITI 5 FR JL3.5 (CATHETERS) ×2 IMPLANT
ELECT DEFIB PAD ADLT CADENCE (PAD) ×2 IMPLANT
GLIDESHEATH SLEND SS 6F .021 (SHEATH) ×2 IMPLANT
GUIDEWIRE .025 260CM (WIRE) ×2 IMPLANT
GUIDEWIRE INQWIRE 1.5J.035X260 (WIRE) ×1 IMPLANT
INQWIRE 1.5J .035X260CM (WIRE) ×2
KIT HEART LEFT (KITS) ×2 IMPLANT
PACK CARDIAC CATHETERIZATION (CUSTOM PROCEDURE TRAY) ×2 IMPLANT
SHEATH GLIDE SLENDER 4/5FR (SHEATH) ×2 IMPLANT
TRANSDUCER W/STOPCOCK (MISCELLANEOUS) ×2 IMPLANT
TUBING CIL FLEX 10 FLL-RA (TUBING) ×2 IMPLANT

## 2017-06-07 NOTE — Progress Notes (Addendum)
Patient Name: Philip Love of Encounter: 05/15/2017  Primary Cardiologist: New (lives in [place]).   Hospital Problem List     Principal Problem:   Acute congestive heart failure (HCC) Active Problems:   NSTEMI (non-ST elevated myocardial infarction) (HCC)   Essential hypertension   Obesity (BMI 30-39.9)   Type 2 diabetes mellitus with peripheral neuropathy (HCC)   Hyperlipidemia   Kidney disease, chronic, stage III (GFR 30-59 ml/min)     Subjective   Feeling so much better today. Chest pain completely resolved. Breathing improved and able to lay flat.   Inpatient Medications    Scheduled Meds: . amLODipine  2.5 mg Oral Daily  . aspirin  81 mg Oral Pre-Cath  . aspirin EC  81 mg Oral Daily  . fenofibrate  160 mg Oral Daily  . furosemide  80 mg Intravenous Q12H  . gabapentin  300 mg Oral BID  . insulin aspart  0-20 Units Subcutaneous TID WC  . insulin glargine  70 Units Subcutaneous Daily  . lisinopril  10 mg Oral Daily  . metoprolol tartrate  25 mg Oral BID  . multivitamin with minerals  1 tablet Oral Daily  . pneumococcal 23 valent vaccine  0.5 mL Intramuscular Tomorrow-1000  . simvastatin  20 mg Oral q1800  . sodium chloride flush  3 mL Intravenous Q12H  . sodium chloride flush  3 mL Intravenous Q12H   Continuous Infusions: . sodium chloride    . sodium chloride    . sodium chloride Stopped (06/10/2017 0553)  . heparin 1,250 Units/hr (06/06/17 1307)  . nitroGLYCERIN     PRN Meds: sodium chloride, sodium chloride, acetaminophen, ondansetron (ZOFRAN) IV, sodium chloride flush, sodium chloride flush   Vital Signs    Vitals:   06/06/17 1624 06/06/17 2034 05/27/2017 0009 05/31/2017 0440  BP: 131/62 (!) 130/47 109/72 127/60  Pulse: 67 71 71 70  Resp: (!) 26 20 19 18   Temp: 97.9 F (36.6 C) 98.2 F (36.8 C) 98 F (36.7 C) (!) 97.5 F (36.4 C)  TempSrc: Oral Oral Oral Oral  SpO2: 94% 97% 99% 95%  Weight:    252 lb 12.8 oz (114.7 kg)  Height:         Intake/Output Summary (Last 24 hours) at 05/26/2017 0700 Last data filed at 05/22/2017 0000  Gross per 24 hour  Intake           503.54 ml  Output              250 ml  Net           253.54 ml   Filed Weights   August 19, 2017 1817 August 19, 2017 2200 05/29/2017 0440  Weight: 238 lb 9.6 oz (108.2 kg) 255 lb (115.7 kg) 252 lb 12.8 oz (114.7 kg)    Physical Exam   GEN: Well nourished, well developed, in no acute distress. obese HEENT: Grossly normal.  Neck: Supple, no JVD, carotid bruits, or masses. Cardiac: RRR, no murmurs, rubs, or gallops. No clubbing, cyanosis, 1-2+ LE bilateral pitting edema.  Radials/DP/PT 2+ and equal bilaterally.  Respiratory:  Respirations regular and unlabored, clear to auscultation bilaterally. GI: Soft, nontender, nondistended, BS + x 4. MS: no deformity or atrophy. Skin: warm and dry, no rash. Neuro:  Strength and sensation are intact. Psych: AAOx3.  Normal affect.  Labs    CBC  Recent Labs  06/06/17 0714 05/28/2017 0400  WBC 8.6 11.5*  HGB 12.0* 12.4*  HCT 36.3* 37.4*  MCV 88.5 86.0  PLT 206 220   Basic Metabolic Panel  Recent Labs  06/06/17 0714 06/11/2017 0400  NA 134* 132*  K 4.6 4.5  CL 98* 96*  CO2 29 29  GLUCOSE 152* 98  BUN 24* 33*  CREATININE 1.77* 2.05*  CALCIUM 8.5* 8.4*   Liver Function Tests No results for input(s): AST, ALT, ALKPHOS, BILITOT, PROT, ALBUMIN in the last 72 hours. No results for input(s): LIPASE, AMYLASE in the last 72 hours. Cardiac Enzymes  Recent Labs  2017/09/24 2351 06/06/17 0526 06/06/17 1120  TROPONINI 1.49* 1.46* 1.16*   BNP Invalid input(s): POCBNP D-Dimer No results for input(s): DDIMER in the last 72 hours. Hemoglobin A1C  Recent Labs  2017/09/24 2357  HGBA1C 6.4*   Fasting Lipid Panel No results for input(s): CHOL, HDL, LDLCALC, TRIG, CHOLHDL, LDLDIRECT in the last 72 hours. Thyroid Function Tests  Recent Labs  2017/09/24 2357  TSH 4.708*    Telemetry    Sinus with PAC/PVCs -  Personally Reviewed  ECG    Sinus with PVCs and non specific ST/TW changes - Personally Reviewed  Radiology    Dg Chest 2 View  Result Date: 12/04/2016 CLINICAL DATA:  Shortness of Breath EXAM: CHEST  2 VIEW COMPARISON:  None. FINDINGS: Cardiac shadow is enlarged. Patchy changes are noted in the lung bases bilaterally consistent with multifocal infiltrate. Some vascular congestion and interstitial edema is noted as well. No sizable effusion is seen. IMPRESSION: Changes of mild CHF with bibasilar opacities. Electronically Signed   By: Alcide CleverMark  Lukens M.D.   On: 001/23/2018 18:19    Cardiac Studies   2D ECHO: 06/06/2017 LV EF: 40% -   45% Study Conclusions - Left ventricle: The cavity size was normal. Wall thickness was   increased in a pattern of moderate LVH. Systolic function was   mildly to moderately reduced. The estimated ejection fraction was   in the range of 40% to 45%. - Mitral valve: There was mild regurgitation. - Left atrium: The atrium was mildly dilated.  Patient Profile        Philip DutchRobert Montanaro is a 76 y.o. male with a history of chronic LBP, HTN, HLD, obesity, CKD stage III and prior cardiac history who [facility] for CHF and NSTEMI.   Assessment & Plan    Acute systolic CHF: 2D ECHO 06/06/17 with EF 40-45%. I/Os positive (likely inaccurate as patient says he has had good UOP). Weight down 3 lbs (255--> 525). He has been on IV lasix 80mg  BID. Creat 1.77--> 2.05. Will hold lasix and gently hydrate this morning. Will recheck a BMET in a few hours and push cath off until later this afternoon  NSTEMI: troponin 1.49--> 1.46--> 1.16. Plan is for [facility] sometime this afternoon if creat improves with hydration  HTN: BP currently well controlled   CKD: creat unclear baseline but creat 2.05 today, up from 1.77. As above, will hold IV lasix and hydrate. Will repeat BMET around noon.  Elevated TSH: mildly elevated at 4.708 but free T4  also slightly elevated 1.18. ? euythryoid sick syndrome   IDDM: continue SSI  Morbid obesity: Body mass index is 38.77 kg/m.  Signed, Cline CrockKathryn Thompson, PA-C  06/03/2017, 7:00 AM   Patient seen and examined  See accompanying note.  Dietrich PatesPaula Kasiyah Platter

## 2017-06-07 NOTE — Plan of Care (Signed)
Problem: Pain Managment: Goal: General experience of comfort will improve Outcome: Progressing Pt reports minimal chest discomfort 4/10. Nitro infusing. Continue to monitor.   Problem: Physical Regulation: Goal: Ability to maintain clinical measurements within normal limits will improve Outcome: Progressing VSS throughout shift. Pt placed on 2L [place] to keep Saturations >92%. Pt tolerating ambulation to bathroom.Pt reports feeling "Much better". Continue to monitor.

## 2017-06-07 NOTE — Progress Notes (Signed)
 Progress Note  Patient Name: Philip Love Date of Encounter: 05/20/2017  Primary Cardiologist: New     Subjective   Breathing is much better  NO CP    Inpatient Medications    Scheduled Meds: . amLODipine  2.5 mg Oral Daily  . aspirin  81 mg Oral Pre-Cath  . aspirin EC  81 mg Oral Daily  . fenofibrate  160 mg Oral Daily  . gabapentin  300 mg Oral BID  . insulin aspart  0-20 Units Subcutaneous TID WC  . insulin glargine  70 Units Subcutaneous Daily  . lisinopril  10 mg Oral Daily  . metoprolol tartrate  25 mg Oral BID  . multivitamin with minerals  1 tablet Oral Daily  . pneumococcal 23 valent vaccine  0.5 mL Intramuscular Tomorrow-1000  . simvastatin  20 mg Oral q1800  . sodium chloride flush  3 mL Intravenous Q12H  . sodium chloride flush  3 mL Intravenous Q12H   Continuous Infusions: . sodium chloride    . sodium chloride    . sodium chloride Stopped (06/06/2017 0553)  . heparin 1,250 Units/hr (06/06/17 1307)  . nitroGLYCERIN     PRN Meds: sodium chloride, sodium chloride, acetaminophen, ondansetron (ZOFRAN) IV, sodium chloride flush, sodium chloride flush   Vital Signs    Vitals:   06/06/17 2034 06/05/2017 0009 06/03/2017 0440 05/24/2017 0757  BP: (!) 130/47 109/72 127/60 132/63  Pulse: 71 71 70 78  Resp: 20 19 18 16  Temp: 98.2 F (36.8 C) 98 F (36.7 C) (!) 97.5 F (36.4 C) 98 F (36.7 C)  TempSrc: Oral Oral Oral Oral  SpO2: 97% 99% 95% 93%  Weight:   252 lb 12.8 oz (114.7 kg)   Height:        Intake/Output Summary (Last 24 hours) at 05/16/2017 0828 Last data filed at 05/20/2017 0000  Gross per 24 hour  Intake           503.54 ml  Output              250 ml  Net           253.54 ml   Filed Weights   05/30/2017 1817 06/11/2017 2200 06/03/2017 0440  Weight: 238 lb 9.6 oz (108.2 kg) 255 lb (115.7 kg) 252 lb 12.8 oz (114.7 kg)    Telemetry    SR   - Personally Reviewed  ECG      Physical Exam   GEN: No acute distress.   Neck: No JVD Cardiac: RRR,  no murmurs, rubs, or gallops.  Respiratory: Clear to auscultation bilaterally. GI: Soft, nontender, non-distended  MS: 1+   edema; No deformity. Neuro:  Nonfocal  Psych: Normal affect   Labs    Chemistry Recent Labs Lab 05/29/2017 1723 06/06/17 0714 05/19/2017 0400  NA 133* 134* 132*  K 4.8 4.6 4.5  CL 98* 98* 96*  CO2 26 29 29  GLUCOSE 193* 152* 98  BUN 24* 24* 33*  CREATININE 1.43* 1.77* 2.05*  CALCIUM 8.7* 8.5* 8.4*  GFRNONAA 47* 36* 30*  GFRAA 55* 42* 35*  ANIONGAP 9 7 7     Hematology Recent Labs Lab 05/14/2017 1723 06/06/17 0714 06/07/17 0400  WBC 10.2 8.6 11.5*  RBC 4.62 4.10* 4.35  HGB 13.6 12.0* 12.4*  HCT 40.8 36.3* 37.4*  MCV 88.3 88.5 86.0  MCH 29.4 29.3 28.5  MCHC 33.3 33.1 33.2  RDW 14.9 15.2 14.7  PLT 241 206 220    Cardiac Enzymes   Recent Labs Lab 05/26/2017 1723 05/14/2017 2351 06/06/17 0526 06/06/17 1120  TROPONINI 0.72* 1.49* 1.46* 1.16*   No results for input(s): TROPIPOC in the last 168 hours.   BNP Recent Labs Lab 06/04/2017 1807  BNP 501.0*     DDimer No results for input(s): DDIMER in the last 168 hours.   Radiology    Dg Chest 2 View  Result Date: 05/22/2017 CLINICAL DATA:  Shortness of Breath EXAM: CHEST  2 VIEW COMPARISON:  None. FINDINGS: Cardiac shadow is enlarged. Patchy changes are noted in the lung bases bilaterally consistent with multifocal infiltrate. Some vascular congestion and interstitial edema is noted as well. No sizable effusion is seen. IMPRESSION: Changes of mild CHF with bibasilar opacities. Electronically Signed   By: Alcide CleverMark  Lukens M.D.   On: 05/14/2017 18:19    Cardiac Studies   Echo LVEF 40 to 45% MOd LVH  Mild MR    Patient Profile    Assessment & Plan    1  Acute CHF    LVEF is depressed   Plan for R and L heart catheterization to define anatomy and pressures  Hydrating now for possible cath this PM  Check BMET at 1   No LV gram if done   2  NSTEMI   Peak torp    1.49  As above  Plan for cath    3   HTN  BP has been OK    4  CKD  Cr 1.43 on admit  Has bumped  WIll hydrate    5  Thyroid  Free T4 is 1.18  Minimally elevated  WIll need t obe followed     6  DM    A1C 6.4    7  HL  Switch to lipitor 80 from simvistatin 20  Check lipids in AM   Signed, Dietrich PatesPaula Michaiah Holsopple, MD  06/03/2017, 8:28 AM

## 2017-06-07 NOTE — Progress Notes (Signed)
ANTICOAGULATION CONSULT NOTE - Follow Up Consult  Pharmacy Consult for Heparin Indication: chest pain/ACS  Allergies  Allergen Reactions  . Bee Venom Swelling    Patient Measurements: Height: 5\' 8"  (172.7 cm) Weight: 252 lb 12.8 oz (114.7 kg) IBW/kg (Calculated) : 68.4 Heparin Dosing Weight:  91.1 kg  Vital Signs: Temp: 98 F (36.7 C) (07/27 0757) Temp Source: Oral (07/27 0757) BP: 132/63 (07/27 0757) Pulse Rate: 78 (07/27 0757)  Labs:  Recent Labs  05/24/2017 1723 05/12/2017 2351 06/06/17 0526 06/06/17 0714 06/06/17 1120 02-09-17 0400  HGB 13.6  --   --  12.0*  --  12.4*  HCT 40.8  --   --  36.3*  --  37.4*  PLT 241  --   --  206  --  220  APTT 37*  --   --   --   --   --   LABPROT 14.3  --   --   --   --   --   INR 1.10  --   --   --   --   --   HEPARINUNFRC  --   --   --  0.38  --  0.39  CREATININE 1.43*  --   --  1.77*  --  2.05*  TROPONINI 0.72* 1.49* 1.46*  --  1.16*  --     Estimated Creatinine Clearance: 39.4 mL/min (A) (by C-G formula based on SCr of 2.05 mg/dL (H)).   Assessment:   Anticoag: Hep for ACS. Troponins elevated. Hgb 13.6>12. Plts [id number]>220.  Possible cath today if renal function improves.  No bleeding reported.  Heparin level therapeutic: 0.39  Goal of Therapy:  Heparin level 0.3-0.7 units/ml Monitor platelets by anticoagulation protocol: Yes   Plan:  Continue heparin gtt at 1250 units/hr Monitor CBC, daily heparin level, s/s of bleeding  Daylene PoseyJonathan Wynton Hufstetler, PharmD Pharmacy Resident Pager #: [phone] 09-28-17 9:35 AM

## 2017-06-07 NOTE — Interval H&P Note (Signed)
History and Physical Interval Note:  05/27/2017 2:03 PM  Philip DutchRobert Arnott  has presented today for surgery, with the diagnosis of NSTEMI  The various methods of treatment have been discussed with the patient and family. After consideration of risks, benefits and other options for treatment, the patient has consented to  Procedure(s): Right/Left Heart Cath and Coronary Angiography (N/A) as a surgical intervention .  The patient's history has been reviewed, patient examined, no change in status, stable for surgery.  I have reviewed the patient's chart and labs.  Questions were answered to the patient's satisfaction.     Theron Aristaeter [facility] Hospital[facility]MD,FACC 05/30/2017 2:04 PM

## 2017-06-07 NOTE — Progress Notes (Signed)
ANTICOAGULATION CONSULT NOTE - Follow Up Consult  Pharmacy Consult for Heparin > restart after cath Indication: chest pain/ACS  Allergies  Allergen Reactions  . Bee Venom Swelling    Patient Measurements: Height: 5\' 8"  (172.7 cm) Weight: 252 lb 12.8 oz (114.7 kg) IBW/kg (Calculated) : 68.4 Heparin Dosing Weight:  91.1 kg  Vital Signs: Temp: 98.3 F (36.8 C) (07/27 1133) Temp Source: Oral (07/27 1133) BP: 138/65 (07/27 1720) Pulse Rate: 69 (07/27 1720)  Labs:  Recent Labs  2017-06-06 1723 2017-06-06 2351 06/06/17 0526 06/06/17 0714 06/06/17 1120 06/01/2017 0400 06/06/2017 1048 05/23/2017 1325  HGB 13.6  --   --  12.0*  --  12.4*  --   --   HCT 40.8  --   --  36.3*  --  37.4*  --   --   PLT 241  --   --  206  --  220  --   --   APTT 37*  --   --   --   --   --   --   --   LABPROT 14.3  --   --   --   --   --   --   --   INR 1.10  --   --   --   --   --   --   --   HEPARINUNFRC  --   --   --  0.38  --  0.39  --   --   CREATININE 1.43*  --   --  1.77*  --  2.05* 2.07* 1.96*  TROPONINI 0.72* 1.49* 1.46*  --  1.16*  --   --   --     Estimated Creatinine Clearance: 41.3 mL/min (A) (by C-G formula based on SCr of 1.96 mg/dL (H)).   Assessment:  76 yo male admitted with chest pain and SOB.  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  Pharmacy asked to resume IV heparin 8 hrs after sheath removed (pulled at 1500 pm).  No overt bleeding or complications noted.  Goal of Therapy:  Heparin level 0.3-0.7 units/ml Monitor platelets by anticoagulation protocol: Yes   Plan:  Resume heparin at 1250 units/hr at 2300 PM. Check heparin level 8 hrs after gtt resumes. Monitor CBC, daily heparin level, s/s of bleeding F/u plans for CABG.  Tad MooreJessica Shardai Star, Pharm D, BCPS  Clinical Pharmacist Pager [phone]  05/14/2017 5:45 PM

## 2017-06-07 NOTE — H&P (View-Only) (Signed)
Progress Note  Patient Name: Philip DutchRobert Clayton Date of Encounter: 05/16/2017  Primary Cardiologist: New     Subjective   Breathing is much better  NO CP    Inpatient Medications    Scheduled Meds: . amLODipine  2.5 mg Oral Daily  . aspirin  81 mg Oral Pre-Cath  . aspirin EC  81 mg Oral Daily  . fenofibrate  160 mg Oral Daily  . gabapentin  300 mg Oral BID  . insulin aspart  0-20 Units Subcutaneous TID WC  . insulin glargine  70 Units Subcutaneous Daily  . lisinopril  10 mg Oral Daily  . metoprolol tartrate  25 mg Oral BID  . multivitamin with minerals  1 tablet Oral Daily  . pneumococcal 23 valent vaccine  0.5 mL Intramuscular Tomorrow-1000  . simvastatin  20 mg Oral q1800  . sodium chloride flush  3 mL Intravenous Q12H  . sodium chloride flush  3 mL Intravenous Q12H   Continuous Infusions: . sodium chloride    . sodium chloride    . sodium chloride Stopped (05/14/2017 0553)  . heparin 1,250 Units/hr (06/06/17 1307)  . nitroGLYCERIN     PRN Meds: sodium chloride, sodium chloride, acetaminophen, ondansetron (ZOFRAN) IV, sodium chloride flush, sodium chloride flush   Vital Signs    Vitals:   06/06/17 2034 05/28/2017 0009 05/29/2017 0440 05/16/2017 0757  BP: (!) 130/47 109/72 127/60 132/63  Pulse: 71 71 70 78  Resp: 20 19 18 16   Temp: 98.2 F (36.8 C) 98 F (36.7 C) (!) 97.5 F (36.4 C) 98 F (36.7 C)  TempSrc: Oral Oral Oral Oral  SpO2: 97% 99% 95% 93%  Weight:   252 lb 12.8 oz (114.7 kg)   Height:        Intake/Output Summary (Last 24 hours) at 05/19/2017 0828 Last data filed at 05/13/2017 0000  Gross per 24 hour  Intake           503.54 ml  Output              250 ml  Net           253.54 ml   Filed Weights   05/20/2017 1817 05/25/2017 2200 05/14/2017 0440  Weight: 238 lb 9.6 oz (108.2 kg) 255 lb (115.7 kg) 252 lb 12.8 oz (114.7 kg)    Telemetry    SR   - Personally Reviewed  ECG      Physical Exam   GEN: No acute distress.   Neck: No JVD Cardiac: RRR,  no murmurs, rubs, or gallops.  Respiratory: Clear to auscultation bilaterally. GI: Soft, nontender, non-distended  MS: 1+   edema; No deformity. Neuro:  Nonfocal  Psych: Normal affect   Labs    Chemistry Recent Labs Lab 05/12/2017 1723 06/06/17 0714 06/01/2017 0400  NA 133* 134* 132*  K 4.8 4.6 4.5  CL 98* 98* 96*  CO2 26 29 29   GLUCOSE 193* 152* 98  BUN 24* 24* 33*  CREATININE 1.43* 1.77* 2.05*  CALCIUM 8.7* 8.5* 8.4*  GFRNONAA 47* 36* 30*  GFRAA 55* 42* 35*  ANIONGAP 9 7 7      Hematology Recent Labs Lab 06/03/2017 1723 06/06/17 0714 05/21/2017 0400  WBC 10.2 8.6 11.5*  RBC 4.62 4.10* 4.35  HGB 13.6 12.0* 12.4*  HCT 40.8 36.3* 37.4*  MCV 88.3 88.5 86.0  MCH 29.4 29.3 28.5  MCHC 33.3 33.1 33.2  RDW 14.9 15.2 14.7  PLT 241 206 220    Cardiac Enzymes  Recent Labs Lab 05/26/2017 1723 05/14/2017 2351 06/06/17 0526 06/06/17 1120  TROPONINI 0.72* 1.49* 1.46* 1.16*   No results for input(s): TROPIPOC in the last 168 hours.   BNP Recent Labs Lab 06/04/2017 1807  BNP 501.0*     DDimer No results for input(s): DDIMER in the last 168 hours.   Radiology    Dg Chest 2 View  Result Date: 05/22/2017 CLINICAL DATA:  Shortness of Breath EXAM: CHEST  2 VIEW COMPARISON:  None. FINDINGS: Cardiac shadow is enlarged. Patchy changes are noted in the lung bases bilaterally consistent with multifocal infiltrate. Some vascular congestion and interstitial edema is noted as well. No sizable effusion is seen. IMPRESSION: Changes of mild CHF with bibasilar opacities. Electronically Signed   By: Alcide CleverMark  Lukens M.D.   On: 05/14/2017 18:19    Cardiac Studies   Echo LVEF 40 to 45% MOd LVH  Mild MR    Patient Profile    Assessment & Plan    1  Acute CHF    LVEF is depressed   Plan for R and L heart catheterization to define anatomy and pressures  Hydrating now for possible cath this PM  Check BMET at 1   No LV gram if done   2  NSTEMI   Peak torp    1.49  As above  Plan for cath    3   HTN  BP has been OK    4  CKD  Cr 1.43 on admit  Has bumped  WIll hydrate    5  Thyroid  Free T4 is 1.18  Minimally elevated  WIll need t obe followed     6  DM    A1C 6.4    7  HL  Switch to lipitor 80 from simvistatin 20  Check lipids in AM   Signed, Dietrich PatesPaula Melady Chow, MD  06/03/2017, 8:28 AM

## 2017-06-07 NOTE — Progress Notes (Signed)
TR BAND REMOVAL  LOCATION:    Radial Right  DEFLATED PER PROTOCOL:   yes  TIME BAND OFF / DRESSING APPLIED:    1725  SITE UPON ARRIVAL:    Level 0  SITE AFTER BAND REMOVAL:    Level 0  CIRCULATION SENSATION AND MOVEMENT:    Within Normal Limits :yes  COMMENTS:

## 2017-06-08 ENCOUNTER — Inpatient Hospital Stay (HOSPITAL_COMMUNITY): Payer: Medicare Other

## 2017-06-08 DIAGNOSIS — N183 Chronic kidney disease, stage 3 (moderate): Secondary | ICD-10-CM

## 2017-06-08 DIAGNOSIS — I25110 Atherosclerotic heart disease of native coronary artery with unstable angina pectoris: Secondary | ICD-10-CM

## 2017-06-08 DIAGNOSIS — Z01810 Encounter for preprocedural cardiovascular examination: Secondary | ICD-10-CM

## 2017-06-08 LAB — PREPARE RBC (CROSSMATCH)

## 2017-06-08 LAB — COMPREHENSIVE METABOLIC PANEL
ALT: 14 U/L — ABNORMAL LOW (ref 17–63)
AST: 27 U/L (ref 15–41)
Albumin: 3.2 g/dL — ABNORMAL LOW (ref 3.5–5.0)
Alkaline Phosphatase: 46 U/L (ref 38–126)
Anion gap: 9 (ref 5–15)
BUN: 30 mg/dL — ABNORMAL HIGH (ref 6–20)
CO2: 24 mmol/L (ref 22–32)
Calcium: 8.1 mg/dL — ABNORMAL LOW (ref 8.9–10.3)
Chloride: 97 mmol/L — ABNORMAL LOW (ref 101–111)
Creatinine, Ser: 1.72 mg/dL — ABNORMAL HIGH (ref 0.61–1.24)
GFR calc Af Amer: 44 mL/min — ABNORMAL LOW (ref >60)
GFR calc non Af Amer: 38 mL/min — ABNORMAL LOW (ref >60)
Glucose, Bld: 116 mg/dL — ABNORMAL HIGH (ref 65–99)
Potassium: 4.5 mmol/L (ref 3.5–5.1)
Sodium: 130 mmol/L — ABNORMAL LOW (ref 135–145)
Total Bilirubin: 0.5 mg/dL (ref 0.3–1.2)
Total Protein: 6.3 g/dL — ABNORMAL LOW (ref 6.5–8.1)

## 2017-06-08 LAB — SURGICAL PCR SCREEN
MRSA, PCR: NEGATIVE
Staphylococcus aureus: POSITIVE — AB

## 2017-06-08 LAB — TSH: TSH: 3.808 u[IU]/mL (ref 0.350–4.500)

## 2017-06-08 LAB — CBC
HEMATOCRIT: 37.3 % — AB (ref 39.0–52.0)
Hemoglobin: 12.2 g/dL — ABNORMAL LOW (ref 13.0–17.0)
MCH: 28.6 pg (ref 26.0–34.0)
MCHC: 32.7 g/dL (ref 30.0–36.0)
MCV: 87.4 fL (ref 78.0–100.0)
Platelets: 238 10*3/uL (ref 150–400)
RBC: 4.27 MIL/uL (ref 4.22–5.81)
RDW: 14.8 % (ref 11.5–15.5)
WBC: 10.1 10*3/uL (ref 4.0–10.5)

## 2017-06-08 LAB — PROTIME-INR
INR: 1.18
INR: 1.2
Prothrombin Time: 15.1 seconds (ref 11.4–15.2)
Prothrombin Time: 15.3 seconds — ABNORMAL HIGH (ref 11.4–15.2)

## 2017-06-08 LAB — GLUCOSE, CAPILLARY
GLUCOSE-CAPILLARY: 140 mg/dL — AB (ref 65–99)
Glucose-Capillary: 120 mg/dL — ABNORMAL HIGH (ref 65–99)
Glucose-Capillary: 156 mg/dL — ABNORMAL HIGH (ref 65–99)
Glucose-Capillary: 132 mg/dL — ABNORMAL HIGH (ref 65–99)

## 2017-06-08 LAB — HEPARIN LEVEL (UNFRACTIONATED)
HEPARIN UNFRACTIONATED: 0.48 [IU]/mL (ref 0.30–0.70)
Heparin Unfractionated: 0.25 IU/mL — ABNORMAL LOW (ref 0.30–0.70)

## 2017-06-08 LAB — LIPID PANEL
CHOLESTEROL: 125 mg/dL (ref 0–200)
HDL: 34 mg/dL — ABNORMAL LOW (ref >40)
LDL CALC: 56 mg/dL (ref 0–99)
TRIGLYCERIDES: 173 mg/dL — AB (ref <150)
Total CHOL/HDL Ratio: 3.7 RATIO
VLDL: 35 mg/dL (ref 0–40)

## 2017-06-08 LAB — POCT I-STAT 3, VENOUS BLOOD GAS (G3P V)
Acid-Base Excess: 3 mmol/L — ABNORMAL HIGH (ref 0.0–2.0)
Bicarbonate: 28.6 mmol/L — ABNORMAL HIGH (ref 20.0–28.0)
O2 Saturation: 62 %
PCO2 VEN: 46 mmHg (ref 44.0–60.0)
PH VEN: 7.402 (ref 7.250–7.430)
PO2 VEN: 32 mmHg (ref 32.0–45.0)
Patient temperature: 98.6
TCO2: 30 mmol/L (ref 0–100)

## 2017-06-08 LAB — ABO/RH: ABO/RH(D): O POS

## 2017-06-08 LAB — APTT: aPTT: 185 seconds (ref 24–36)

## 2017-06-08 MED ORDER — CHLORHEXIDINE GLUCONATE 0.12 % MT SOLN
15.0000 mL | Freq: Once | OROMUCOSAL | Status: AC
  Administered 2017-06-09: 15 mL via OROMUCOSAL
  Filled 2017-06-08: qty 15

## 2017-06-08 MED ORDER — NITROGLYCERIN IN D5W 200-5 MCG/ML-% IV SOLN
2.0000 ug/min | INTRAVENOUS | Status: DC
  Filled 2017-06-08: qty 250

## 2017-06-08 MED ORDER — PHENYLEPHRINE HCL 10 MG/ML IJ SOLN
30.0000 ug/min | INTRAMUSCULAR | Status: AC
  Administered 2017-06-09: 25 ug/min via INTRAVENOUS
  Filled 2017-06-08: qty 2

## 2017-06-08 MED ORDER — SODIUM CHLORIDE 0.9 % IV SOLN
INTRAVENOUS | Status: AC
  Administered 2017-06-09: .9 [IU]/h via INTRAVENOUS
  Filled 2017-06-08: qty 1

## 2017-06-08 MED ORDER — DOPAMINE-DEXTROSE 3.2-5 MG/ML-% IV SOLN
0.0000 ug/kg/min | INTRAVENOUS | Status: AC
  Administered 2017-06-09: 3 ug/kg/min via INTRAVENOUS
  Filled 2017-06-08: qty 250

## 2017-06-08 MED ORDER — SODIUM CHLORIDE 0.9 % IV SOLN
INTRAVENOUS | Status: DC
  Filled 2017-06-08: qty 30

## 2017-06-08 MED ORDER — TEMAZEPAM 15 MG PO CAPS: 15.0000 mg | ORAL_CAPSULE | Freq: Once | ORAL | Status: DC | PRN

## 2017-06-08 MED ORDER — METOPROLOL TARTRATE 12.5 MG HALF TABLET
12.5000 mg | ORAL_TABLET | Freq: Once | ORAL | Status: AC
  Administered 2017-06-09: 12.5 mg via ORAL
  Filled 2017-06-08: qty 1

## 2017-06-08 MED ORDER — DEXMEDETOMIDINE HCL IN NACL 400 MCG/100ML IV SOLN
0.1000 ug/kg/h | INTRAVENOUS | Status: AC
  Administered 2017-06-09: 0.7 ug/kg/h via INTRAVENOUS
  Filled 2017-06-08 (×2): qty 100

## 2017-06-08 MED ORDER — EPINEPHRINE PF 1 MG/ML IJ SOLN
0.0000 ug/min | INTRAVENOUS | Status: DC
  Filled 2017-06-08: qty 4

## 2017-06-08 MED ORDER — VANCOMYCIN HCL 10 G IV SOLR
1500.0000 mg | INTRAVENOUS | Status: AC
  Administered 2017-06-09: 1500 mg via INTRAVENOUS
  Filled 2017-06-08: qty 1500

## 2017-06-08 MED ORDER — SODIUM CHLORIDE 0.9 % IV SOLN
1.5000 mg/kg/h | INTRAVENOUS | Status: AC
  Administered 2017-06-09: 1.5 mg/kg/h via INTRAVENOUS
  Filled 2017-06-08: qty 25

## 2017-06-08 MED ORDER — BISACODYL 5 MG PO TBEC: 5.0000 mg | DELAYED_RELEASE_TABLET | Freq: Once | ORAL | Status: DC

## 2017-06-08 MED ORDER — CARVEDILOL 6.25 MG PO TABS
6.2500 mg | ORAL_TABLET | Freq: Two times a day (BID) | ORAL | Status: DC
  Administered 2017-06-08 (×2): 6.25 mg via ORAL
  Filled 2017-06-08 (×2): qty 1

## 2017-06-08 MED ORDER — TRANEXAMIC ACID (OHS) BOLUS VIA INFUSION
15.0000 mg/kg | INTRAVENOUS | Status: AC
  Administered 2017-06-09: 1731 mg via INTRAVENOUS
  Filled 2017-06-08: qty 1731

## 2017-06-08 MED ORDER — POTASSIUM CHLORIDE 2 MEQ/ML IV SOLN
80.0000 meq | INTRAVENOUS | Status: DC
Start: 2017-06-09 — End: 2017-06-09
  Filled 2017-06-08 (×2): qty 40

## 2017-06-08 MED ORDER — MUPIROCIN 2 % EX OINT
1.0000 "application " | TOPICAL_OINTMENT | Freq: Two times a day (BID) | CUTANEOUS | Status: DC
  Administered 2017-06-08 (×2): 1 via NASAL
  Filled 2017-06-08 (×2): qty 22

## 2017-06-08 MED ORDER — CHLORHEXIDINE GLUCONATE 4 % EX LIQD
60.0000 mL | Freq: Once | CUTANEOUS | Status: AC
  Administered 2017-06-09: 4 via TOPICAL
  Filled 2017-06-08: qty 15

## 2017-06-08 MED ORDER — TRANEXAMIC ACID (OHS) PUMP PRIME SOLUTION
2.0000 mg/kg | INTRAVENOUS | Status: DC
  Filled 2017-06-08: qty 2.31

## 2017-06-08 MED ORDER — DIAZEPAM 5 MG PO TABS
5.0000 mg | ORAL_TABLET | Freq: Once | ORAL | Status: AC
  Administered 2017-06-09: 5 mg via ORAL
  Filled 2017-06-08: qty 1

## 2017-06-08 MED ORDER — CHLORHEXIDINE GLUCONATE 4 % EX LIQD
60.0000 mL | Freq: Once | CUTANEOUS | Status: AC
  Administered 2017-06-08: 4 via TOPICAL
  Filled 2017-06-08: qty 15

## 2017-06-08 MED ORDER — MAGNESIUM SULFATE 50 % IJ SOLN
40.0000 meq | INTRAMUSCULAR | Status: DC
  Filled 2017-06-08 (×2): qty 10

## 2017-06-08 MED ORDER — DEXTROSE 5 % IV SOLN
1.5000 g | INTRAVENOUS | Status: AC
  Administered 2017-06-09: 1.5 g via INTRAVENOUS
  Administered 2017-06-09: .75 g via INTRAVENOUS
  Filled 2017-06-08: qty 1.5

## 2017-06-08 MED ORDER — DEXTROSE 5 % IV SOLN
750.0000 mg | INTRAVENOUS | Status: DC
  Filled 2017-06-08: qty 750

## 2017-06-08 MED ORDER — PLASMA-LYTE 148 IV SOLN
INTRAVENOUS | Status: AC
  Administered 2017-06-09: 500 mL
  Filled 2017-06-08: qty 2.5

## 2017-06-08 NOTE — Progress Notes (Signed)
ANTICOAGULATION CONSULT NOTE - Follow Up Consult  Pharmacy Consult for Heparin > restart after cath Indication: chest pain/ACS  Allergies  Allergen Reactions  . Bee Venom Swelling    Patient Measurements: Height: 5\' 8"  (172.7 cm) Weight: 254 lb 6.4 oz (115.4 kg) IBW/kg (Calculated) : 68.4 Heparin Dosing Weight:  91.1 kg  Vital Signs: Temp: 98.8 F (37.1 C) (07/27 2300) Temp Source: Oral (07/27 2300) BP: 138/53 (07/28 0600) Pulse Rate: 62 (07/28 0600)  Labs:  Recent Labs  05/31/2017 1723 05/27/2017 2351 06/06/17 0526 06/06/17 0714 06/06/17 1120 2017/08/15 0400 2017/08/15 1048 2017/08/15 1325 06/08/17 0725  HGB 13.6  --   --  12.0*  --  12.4*  --   --  12.2*  HCT 40.8  --   --  36.3*  --  37.4*  --   --  37.3*  PLT 241  --   --  206  --  220  --   --  238  APTT 37*  --   --   --   --   --   --   --   --   LABPROT 14.3  --   --   --   --   --   --   --  15.1  INR 1.10  --   --   --   --   --   --   --  1.18  HEPARINUNFRC  --   --   --  0.38  --  0.39  --   --  0.25*  CREATININE 1.43*  --   --  1.77*  --  2.05* 2.07* 1.96*  --   TROPONINI 0.72* 1.49* 1.46*  --  1.16*  --   --   --   --     Estimated Creatinine Clearance: 41.4 mL/min (A) (by C-G formula based on SCr of 1.96 mg/dL (H)).   Assessment:  [age] yoM admitted with chest pain and SOB now s/p LHC showing multivessel CAD. Pt resumed on heparin 8-hr after sheath removal for TCTS consult for possible CABG. Heparin level slightly subtherapeutic this morning at 0.25, CBC stable, no S/Sx bleeding noted.  Goal of Therapy:  Heparin level 0.3-0.7 units/ml Monitor platelets by anticoagulation protocol: Yes   Plan:  -Increase heparin to 1400 units/hr -Check 8-hr confirmatory heparin level -Monitor CBC, heparin level, S/Sx bleeding daily -Follow-up plans for CABG  Fredonia HighlandMichael Moya Duan, PharmD PGY-2 Cardiology Pharmacy Resident Pager: [phone] 06/08/2017

## 2017-06-08 NOTE — Progress Notes (Signed)
ANTICOAGULATION CONSULT NOTE - Follow Up Consult  Pharmacy Consult for Heparin > restart after cath Indication: chest pain/ACS  Allergies  Allergen Reactions  . Bee Venom Swelling    Patient Measurements: Height: 5\' 8"  (172.7 cm) Weight: 254 lb 6.4 oz (115.4 kg) IBW/kg (Calculated) : 68.4 Heparin Dosing Weight:  91.1 kg  Vital Signs: Temp: 97.7 F (36.5 C) (07/28 1258) Temp Source: Oral (07/28 1258) BP: 142/50 (07/28 1300) Pulse Rate: 65 (07/28 1300)  Labs:  Recent Labs  05/17/2017 1723 06/04/2017 2351 06/06/17 0526  06/06/17 0714 06/06/17 1120 Aug 25, 2017 0400 Aug 25, 2017 1048 Aug 25, 2017 1325 06/08/17 0725 06/08/17 1304 06/08/17 1545  HGB 13.6  --   --   --  12.0*  --  12.4*  --   --  12.2*  --   --   HCT 40.8  --   --   --  36.3*  --  37.4*  --   --  37.3*  --   --   PLT 241  --   --   --  206  --  220  --   --  238  --   --   APTT 37*  --   --   --   --   --   --   --   --   --  185*  --   LABPROT 14.3  --   --   --   --   --   --   --   --  15.1 15.3*  --   INR 1.10  --   --   --   --   --   --   --   --  1.18 1.20  --   HEPARINUNFRC  --   --   --   < > 0.38  --  0.39  --   --  0.25*  --  0.48  CREATININE 1.43*  --   --   --  1.77*  --  2.05* 2.07* 1.96* 1.72*  --   --   TROPONINI 0.72* 1.49* 1.46*  --   --  1.16*  --   --   --   --   --   --   < > = values in this interval not displayed.  Estimated Creatinine Clearance: 47.2 mL/min (A) (by C-G formula based on SCr of 1.72 mg/dL (H)).   Assessment:  [age] yoM admitted with chest pain and SOB now s/p LHC showing multivessel CAD. Pt resumed on heparin 8-hr after sheath removal for TCTS consult and for CABG on 7/29. -heparin level at goal after increase to 1400 units/hr  Goal of Therapy:  Heparin level 0.3-0.7 units/ml Monitor platelets by anticoagulation protocol: Yes   Plan:  -No heparin changes needed -Monitor CBC, heparin level -CABG in am  Harland GermanAndrew Tray Klayman, Pharm D 06/08/2017 4:37 PM

## 2017-06-08 NOTE — Progress Notes (Signed)
[id number] Cardiac Rehab Completed pre-op education with pt. I gave him pre-op education booklet and pt care guide. He has IS and has been using it. We discussed ambulation,use of IS and need for 24/7 care for the first week at discharge. He states that his wife will be able to provide care for him. I encouraged pt to watch pre-op surgery video. He plans to watch it when his family arrives.

## 2017-06-08 NOTE — Consult Note (Signed)
[address]       [place] [address]             [phone]        Estle Huguley [facility] Medical Record #[id number] Date of Birth: 30-Nov-1940  Referring: Dr. Viann Fish  Primary Care: Pearson Grippe, MD  Chief Complaint:    Chief Complaint  Patient presents with  . Shortness of Breath  Patient examined, coronary arteriogram and echocardiogram images personally reviewed and counseled with patient  History of Present Illness:     76 year old obese insulin-dependent diabetic recently moved from [place] to [facility]. He presented to the emergency department there with 2 weeks of progressive shortness of breath ankle swelling and chest discomfort. He had nonspecific EKG changes but was in sinus rhythm. Cardiac enzymes were mildly positive and chest x-ray showed mild CHF. He was transferred to this hospital. His creatinine was 2. Echocardiogram showed EF of 45%. No significant MR. Creatinine improved to 1.7 and he underwent coronary angiography and right heart cath. Coronary showed a 90% left main stenosis, proximal 80-90% LAD stenosis, 90% stenosis of the mid RCA and 80% stenosis of the proximal circumflex. LVEDP is 12. Right heart cath showed cardiac output of 5.5 L/m with normal right-sided pressures. The patient was admitted to the ICU placed on IV heparin and nitroglycerin. His creatinine post cath remains at 1.7. He is having no further chest pain. Blood sugars are fairly well controlled.  No family history of CABG. Patient is a diabetic with neuropathy and retinopathy. Patient has hypertension and remote history of smoking. No history of peripheral vascular disease. His primary care physician in [place] told him he should have carotid ultrasound performed 2-3 years ago which he never followed up on.   Current Activity/ Functional Status: Patient has chronic back pain status post lumbar laminectomy walks with a cane and has sciatica. His  activity level is limited. He is retired.   Zubrod Score: At the time of surgery this patient's most appropriate activity status/level should be described as: []     0    Normal activity, no symptoms []     1    Restricted in physical strenuous activity but ambulatory, able to do out light work []     2    Ambulatory and capable of self care, unable to do work activities, up and about                 more than 50%  Of the time                            [x]     3    Only limited self care, in bed greater than 50% of waking hours []     4    Completely disabled, no self care, confined to bed or chair []     5    Moribund  Past Medical History:  Diagnosis Date  . Arthritis    "generalized; elbows; knees" (2017/06/26)  . CHF (congestive heart failure) (HCC) June 26, 2017  . Chronic lower back pain   . Essential hypertension   . Hyperlipidemia   . Obesity (BMI 30-39.9)   . Type 2 diabetes mellitus (HCC)     Past Surgical History:  Procedure Laterality Date  . APPENDECTOMY    . BACK SURGERY    . EYE SURGERY Bilateral ~ 2016   "bleeding veins"  .  LUMBAR DISC SURGERY  ~ 2010 or after  . TONSILLECTOMY      History  Smoking Status  . Former Smoker  . Packs/day: 0.50  . Years: 6.00  . Types: Cigarettes  . Quit date: 1975  Smokeless Tobacco  . Never Used    History  Alcohol Use No    Social History   Social History  . Marital status: Married    Spouse name: N/A  . Number of children: N/A  . Years of education: N/A   Occupational History  . Not on file.   Social History Main Topics  . Smoking status: Former Smoker    Packs/day: 0.50    Years: 6.00    Types: Cigarettes    Quit date: [id number]  . Smokeless tobacco: Never Used  . Alcohol use No  . Drug use: No  . Sexual activity: Not on file   Other Topics Concern  . Not on file   Social History Narrative  . No narrative on file    Allergies  Allergen Reactions  . Bee Venom Swelling    Current Facility-Administered  Medications  Medication Dose Route Frequency Provider Last Rate Last Dose  . 0.9 %  sodium chloride infusion  250 mL Intravenous PRN Othella Boyerilley, William S, MD      . 0.9 %  sodium chloride infusion   Intravenous Continuous Janetta Horahompson, Kathryn R, PA-C 75 mL/hr at 06/08/17 [id number]    . 0.9 %  sodium chloride infusion  250 mL Intravenous PRN Sw[place]Jordan, Blessings Inglett M, MD      . acetaminophen (TYLENOL) tablet 650 mg  650 mg Oral Q4H PRN Othella Boyerilley, William S, MD      . amLODipine (NORVASC) tablet 2.5 mg  2.5 mg Oral Daily Othella Boyerilley, William S, MD   2.5 mg at 06/08/17 0912  . aspirin EC tablet 81 mg  81 mg Oral Daily Othella Boyerilley, William S, MD   81 mg at 06/08/17 [id number]  . atorvastatin (LIPITOR) tablet 80 mg  80 mg Oral q1800 Pricilla Riffleoss, Paula V, MD   80 mg at 06/06/2017 2245  . bisacodyl (DULCOLAX) EC tablet 5 mg  5 mg Oral Once Donata ClayVan Trigt, Theron AristaPeter, MD      . carvedilol (COREG) tablet 6.25 mg  6.25 mg Oral BID WC Jonelle SidleMcDowell, Samuel G, MD   6.25 mg at 06/08/17 0930  . [START ON 05/14/2017] cefUROXime (ZINACEF) 750 mg in dextrose 5 % 50 mL IVPB  750 mg Intravenous To OR Mosetta AnisBitonti, Michael T, RPH      . chlorhexidine (HIBICLENS) 4 % liquid 4 application  60 mL Topical Once Donata ClayVan Trigt, Theron AristaPeter, MD       And  . Melene Muller[START ON 05/25/2017] chlorhexidine (HIBICLENS) 4 % liquid 4 application  60 mL Topical Once Donata ClayVan Trigt, Theron AristaPeter, MD      . Melene Muller[START ON 06/02/2017] chlorhexidine (PERIDEX) 0.12 % solution 15 mL  15 mL Mouth/Throat Once Donata ClayVan Trigt, Theron AristaPeter, MD      . Melene Muller[START ON 05/27/2017] dexmedetomidine (PRECEDEX) 400 MCG/100ML (4 mcg/mL) infusion  0.1-0.7 mcg/kg/hr Intravenous To OR Mosetta AnisBitonti, Michael T, RPH      . [START ON 06/11/2017] diazepam (VALIUM) tablet 5 mg  5 mg Oral Once Donata ClayVan Trigt, Theron AristaPeter, MD      . Melene Muller[START ON 06/11/2017] DOPamine (INTROPIN) 800 mg in dextrose 5 % 250 mL (3.2 mg/mL) infusion  0-10 mcg/kg/min Intravenous To OR Mosetta AnisBitonti, Michael T, RPH      . [START ON 06/06/2017] EPINEPHrine (ADRENALIN) 4 mg in dextrose  5 % 250 mL (0.016 mg/mL) infusion  0-10 mcg/min  Intravenous To OR Mosetta AnisBitonti, Michael T, RPH      . fenofibrate tablet 160 mg  160 mg Oral Daily Othella Boyerilley, William S, MD   160 mg at 06/08/17 0912  . gabapentin (NEURONTIN) capsule 300 mg  300 mg Oral BID Othella Boyerilley, William S, MD   300 mg at 06/08/17 0913  . [START ON 05/13/2017] heparin 2,500 Units, papaverine 30 mg in electrolyte-148 (PLASMALYTE-148) 500 mL irrigation   Irrigation To OR Mosetta AnisBitonti, Michael T, RPH      . [START ON 05/16/2017] heparin 30,000 units/NS 1000 mL solution for CELLSAVER   Other To OR Mosetta AnisBitonti, Michael T, RPH      . heparin ADULT infusion 100 units/mL (25000 units/28950mL sodium chloride 0.45%)  1,400 Units/hr Intravenous Continuous Mosetta AnisBitonti, Michael T, RPH 14 mL/hr at 06/08/17 0917 1,400 Units/hr at 06/08/17 0917  . insulin aspart (novoLOG) injection 0-20 Units  0-20 Units Subcutaneous TID WC Othella Boyerilley, William S, MD   4 Units at 06/06/17 1307  . insulin glargine (LANTUS) injection 70 Units  70 Units Subcutaneous QHS Pricilla Riffleoss, Paula V, MD      . Melene Muller[START ON 05/28/2017] insulin regular (NOVOLIN R,HUMULIN R) 100 Units in sodium chloride 0.9 % 100 mL (1 Units/mL) infusion   Intravenous To OR Mosetta AnisBitonti, Michael T, RPH      . [START ON 05/29/2017] magnesium sulfate (IV Push/IM) injection 40 mEq  40 mEq Other To OR Mosetta AnisBitonti, Michael T, RPH      . MEDLINE mouth rinse  15 mL Mouth Rinse BID Pricilla Riffleoss, Paula V, MD   15 mL at 05/22/2017 2300  . [START ON 05/17/2017] metoprolol tartrate (LOPRESSOR) tablet 12.5 mg  12.5 mg Oral Once Donata ClayVan Trigt, Theron AristaPeter, MD      . multivitamin with minerals tablet 1 tablet  1 tablet Oral Daily Othella Boyerilley, William S, MD   1 tablet at 06/08/17 0913  . mupirocin ointment (BACTROBAN) 2 % 1 application  1 application Nasal BID Donata ClayVan Trigt, Theron AristaPeter, MD   1 application at 06/08/17 0911  . nitroGLYCERIN 50 mg in dextrose 5 % 250 mL (0.2 mg/mL) infusion  10 mcg/min Intravenous Titrated Othella Boyerilley, William S, MD   Stopped at 06/06/2017 2300  . [START ON 05/15/2017] nitroGLYCERIN 50 mg in dextrose 5 % 250 mL (0.2 mg/mL)  infusion  2-200 mcg/min Intravenous To OR Mosetta AnisBitonti, Michael T, RPH      . ondansetron [facility](ZOFRAN) injection 4 mg  4 mg Intravenous Q6H PRN Othella Boyerilley, William S, MD      . Melene Muller[START ON 05/25/2017] phenylephrine (NEO-SYNEPHRINE) 20 mg in sodium chloride 0.9 % 250 mL (0.08 mg/mL) infusion  30-200 mcg/min Intravenous To OR Mosetta AnisBitonti, Michael T, RPH      . pneumococcal 23 valent vaccine (PNU-IMMUNE) injection 0.5 mL  0.5 mL Intramuscular Tomorrow-1000 Jonelle SidleMcDowell, Samuel G, MD      . Melene Muller[START ON 05/25/2017] potassium chloride injection 80 mEq  80 mEq Other To OR Mosetta AnisBitonti, Michael T, RPH      . sodium chloride flush (NS) 0.9 % injection 3 mL  3 mL Intravenous Q12H Othella Boyerilley, William S, MD   3 mL at 05/15/2017 2300  . sodium chloride flush (NS) 0.9 % injection 3 mL  3 mL Intravenous PRN Othella Boyerilley, William S, MD      . sodium chloride flush (NS) 0.9 % injection 3 mL  3 mL Intravenous Q12H [place]az[place]Jordan, Zyon Grout M, MD   10 mL at 06/08/17 0916  . sodium chloride  flush (NS) 0.9 % injection 3 mL  3 mL Intravenous PRN [place], Tattianna Schnarr M, MD      . temazepam (RESTORIL) capsule 15 mg  15 mg Oral Once PRN Donata Clay, Theron Arista, MD      . Melene Muller ON 06-12-17] tranexamic acid (CYKLOKAPRON) 2,500 mg in sodium chloride 0.9 % 250 mL (10 mg/mL) infusion  1.5 mg/kg/hr Intravenous To OR Mosetta Anis, RPH      . [START ON 2017/06/12] tranexamic acid (CYKLOKAPRON) bolus via infusion - over 30 minutes 1,731 mg  15 mg/kg Intravenous To OR Mosetta Anis, RPH      . [START ON 12-Jun-2017] tranexamic acid (CYKLOKAPRON) pump prime solution 231 mg  2 mg/kg Intracatheter To OR Mosetta Anis, [facility]        Prescriptions Prior to Admission  Medication Sig Dispense Refill Last Dose  . amLODipine (NORVASC) 2.5 MG tablet Take 2.5 mg by mouth daily.   06/03/2017 at 1300  . aspirin EC 81 MG tablet Take 81 mg by mouth daily.   06/01/2017 at 1300  . Carboxymethylcellul-Glycerin (CLEAR EYES FOR DRY EYES OP) Place 1-2 drops into both eyes daily as needed.   Past Month at  Unknown time  . fenofibrate 160 MG tablet Take 160 mg by mouth daily.   05/23/2017 at 1300  . gabapentin (NEURONTIN) 300 MG capsule Take 300 mg by mouth 2 (two) times daily.   06/10/2017 at 1300  . hydrochlorothiazide (HYDRODIURIL) 50 MG tablet Take 50 mg by mouth daily.   06/01/2017 at 1300  . Krill Oil 350 MG CAPS Take 350 mg by mouth daily.   06/03/2017 at 1300  . lisinopril (PRINIVIL,ZESTRIL) 10 MG tablet Take 10 mg by mouth daily.   05/26/2017 at 1300  . metoprolol tartrate (LOPRESSOR) 25 MG tablet Take 25 mg by mouth 2 (two) times daily.   05/23/2017 at 1300  . Multiple Vitamin (MULTIVITAMIN WITH MINERALS) TABS tablet Take 1 tablet by mouth daily.   05/14/2017 at 1300  . naproxen sodium (ANAPROX) 220 MG tablet Take 440 mg by mouth daily as needed.   Past Week at Unknown time  . simvastatin (ZOCOR) 20 MG tablet Take 20 mg by mouth daily.   05/27/2017 at 1300    Family History  Problem Relation Age of Onset  . Heart disease Mother 52  . Heart attack Father 34  . Diabetes Father   . Heart attack Sister 78  . Congestive Heart Failure Sister      Review of Systems:       Cardiac Review of Systems: Y or N  Chest Pain [   Yes ]  Resting SOB [ yes  ] Exertional SOB  [  yes]  Orthopnea [ yes ]   Pedal Edema [ yes  ]    Palpitations [no  ] Syncope  [ no ]   Presyncope [  no ]  General Review of Systems: [Y] = yes [  ]=no Constitional: recent weight change [  ]; anorexia [  ]; fatigue [ yes ]; nausea [  ]; night sweats [  ]; fever [  ]; or chills [  ]  Dental: poor dentition[  ]; Last Dentist visit: One year  Eye : blurred vision [  ]; diplopia [   ]; vision changes [yes from retinopathy  ];  Amaurosis fugax[  ]; Resp: cough [  ];  wheezing[  ];  hemoptysis[  ]; shortness of breath[ yes ]; paroxysmal nocturnal dyspnea[  ]; dyspnea on exertion[ yes ]; or orthopnea[  ];  GI:  gallstones[  ], vomiting[  ];  dysphagia[  ]; melena[  ];   hematochezia [  ]; heartburn[ yes ];   Hx of  Colonoscopy[  ]; GU: kidney stones [  ]; hematuria[  ];   dysuria [  ];  nocturia[  ];  history of     obstruction [  ]; urinary frequency [  ]             Skin: rash, swelling[  ];, hair loss[  ];  peripheral edema[  ];  or itching[  ]; Musculosketetal: myalgias[  ];  joint swelling[  ];  joint erythema[  ];  joint pain[  ];  back pain[ yes  ];  Heme/Lymph: bruising[  ];  bleeding[  ];  anemia[  ];  Neuro: TIA[  ];  headaches[  ];  stroke[  ];  vertigo[  ];  seizures[  ];   paresthesias[  ];  difficulty walking[ yes ];  Psych:depression[  ]; anxiety[  ];  Endocrine: diabetes[ yes  ];  thyroid dysfunction[  ];  Immunizations: Flu [  ]; Pneumococcal[  ];  Other:  Physical Exam: BP (!) 133/100 (BP Location: Left Wrist)   Pulse 76   Temp 98.6 F (37 C) (Oral)   Resp (!) 21   Ht 5\' 8"  (1.727 m)   Wt 254 lb 6.4 oz (115.4 kg)   SpO2 95%   BMI 38.68 kg/m        Physical Exam  General:  HEENT: Normocephalic pupils equal , dentition adequate Neck: Supple without JVD, adenopathy, or bruit Chest: Clear to auscultation, symmetrical breath sounds, no rhonchi, no tenderness             or deformity Cardiovascular: Regular rate and rhythm, no murmur, no gallop, peripheral pulses             palpable in all extremities Abdomen:  Soft, nontender, no palpable mass or organomegaly Extremities: Warm, well-perfused, no clubbing cyanosis. Moderate 2+ bilateral ankle edema without tenderness,, right pretibial skin has erythema but without ulceration              no venous varicosity changes of the legs Rectal/GU: Deferred Neuro: Grossly non--focal and symmetrical throughout Skin: Clean and dry without rash or ulceration Middle-aged obese Caucasian male no acute distress accompanied by family  Diagnostic Studies & Laboratory data:     Recent Radiology Findings:   Dg Chest Port 1 View  Result Date: 06/08/2017 CLINICAL DATA:  Angina at rest EXAM:  PORTABLE CHEST 1 VIEW COMPARISON:  06/01/2017 FINDINGS: Cardiomegaly with vascular congestion and bilateral interstitial/ alveolar opacities compatible with edema/ CHF. No visible effusions. No acute bony abnormality. IMPRESSION: Stable edema/CHF Electronically Signed   By: Charlett Nose M.D.   On: 06/08/2017 07:50     I have independently reviewed the above radiologic studies.  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 Creatinine today is improved. We'll schedule for multivessel bypass surgery in a.m. Procedure indications benefits and risks of discussed with patient and family and he understands and agrees to proceed.       @ME1 @ 06/08/2017 12:30 PM

## 2017-06-08 NOTE — Progress Notes (Signed)
Pre-op Cardiac Surgery  Carotid Findings:  1-39% right ICA stenosis.  40-59% left ICA stenosis, highest end of scale.  Upper Extremity Right Left  Brachial Pressures T 142T  Radial Waveforms T T  Ulnar Waveforms T T  Palmar Arch (Allen's Test) WNL Doppler signal remains normal with radial compression and diminishes 50% with ulnar compression   Findings:      Lower  Extremity Right Left  Dorsalis Pedis    Anterior Tibial 92 DM 150 DM  Posterior Tibial >300 DM 142 DM  Ankle/Brachial Indices 2.11 1.06    Findings:  Right ABI not ascertained secondary to calcified vessels.  Left ABI most likely elevated secondary to calcified vessels as waveforms are dampened.

## 2017-06-08 NOTE — Progress Notes (Signed)
Progress Note  Patient Name: Philip Love Date of Encounter: 06/08/2017  Admitting Cardiologist: Dr. Dietrich PatesPaula Ross  Subjective   Sitting in bed side chair. States that he is breathing more easily. No chest pain.  Inpatient Medications    Scheduled Meds: . amLODipine  2.5 mg Oral Daily  . aspirin EC  81 mg Oral Daily  . atorvastatin  80 mg Oral q1800  . fenofibrate  160 mg Oral Daily  . gabapentin  300 mg Oral BID  . insulin aspart  0-20 Units Subcutaneous TID WC  . insulin glargine  70 Units Subcutaneous QHS  . mouth rinse  15 mL Mouth Rinse BID  . metoprolol tartrate  25 mg Oral BID  . multivitamin with minerals  1 tablet Oral Daily  . mupirocin ointment  1 application Nasal BID  . pneumococcal 23 valent vaccine  0.5 mL Intramuscular Tomorrow-1000  . sodium chloride flush  3 mL Intravenous Q12H  . sodium chloride flush  3 mL Intravenous Q12H   Continuous Infusions: . sodium chloride    . sodium chloride 75 mL/hr (06/08/17 0102)  . sodium chloride    . heparin 1,250 Units/hr (06/08/17 0600)  . nitroGLYCERIN Stopped (06/10/2017 2300)   PRN Meds: sodium chloride, sodium chloride, acetaminophen, ondansetron (ZOFRAN) IV, sodium chloride flush, sodium chloride flush   Vital Signs    Vitals:   06/08/17 0300 06/08/17 0400 06/08/17 0500 06/08/17 0600  BP: (!) 119/45 (!) 137/44 (!) 113/45 (!) 138/53  Pulse: 61 72 61 62  Resp: 16 18 16  (!) 21  Temp:      TempSrc:      SpO2: 99% 100% 98% 91%  Weight:   254 lb 6.4 oz (115.4 kg)   Height:        Intake/Output Summary (Last 24 hours) at 06/08/17 0755 Last data filed at 06/08/17 0600  Gross per 24 hour  Intake           993.33 ml  Output              300 ml  Net           693.33 ml   Filed Weights   05/24/2017 0440 05/23/2017 1845 06/08/17 0500  Weight: 252 lb 12.8 oz (114.7 kg) 252 lb 13.9 oz (114.7 kg) 254 lb 6.4 oz (115.4 kg)    Telemetry    Sinus rhythm, PACs and burst of PAT. Personally reviewed.  ECG      Tracing from 06/06/2017 shows sinus rhythm with PVCs, inferolateral ST-T wave abnormalities overall nonspecific but consistent with ischemia. Personally reviewed.  Physical Exam   GEN: Obese male, no acute distress. Neck: No JVD. Cardiac: RRR, no murmur or gallop.  Respiratory: Nonlabored. Decreased breath sounds. GI:  Obese, nontender, bowel sounds present. MS:  2-3+ leg edema. Neuro:  Nonfocal. Psych: Alert and oriented x 3. Normal affect.  Labs    Chemistry Recent Labs Lab 05/12/2017 0400 06/08/2017 1048 05/31/2017 1325  NA 132* 133* 133*  K 4.5 5.1 5.7*  CL 96* 94* 95*  CO2 29 28 28   GLUCOSE 98 108* 93  BUN 33* 35* 34*  CREATININE 2.05* 2.07* 1.96*  CALCIUM 8.4* 8.6* 8.6*  GFRNONAA 30* 30* 32*  GFRAA 35* 35* 37*  ANIONGAP 7 11 10      Hematology Recent Labs Lab 06/06/17 0714 06/03/2017 0400 06/08/17 0725  WBC 8.6 11.5* 10.1  RBC 4.10* 4.35 4.27  HGB 12.0* 12.4* 12.2*  HCT 36.3* 37.4* 37.3*  MCV  88.5 86.0 87.4  MCH 29.3 28.5 28.6  MCHC 33.1 33.2 32.7  RDW 15.2 14.7 14.8  PLT 206 220 238    Cardiac Enzymes Recent Labs Lab 2017-03-06 1723 2017-03-06 2351 06/06/17 0526 06/06/17 1120  TROPONINI 0.72* 1.49* 1.46* 1.16*   No results for input(s): TROPIPOC in the last 168 hours.   BNP Recent Labs Lab 2017-03-06 1807  BNP 501.0*     Radiology    Dg Chest Port 1 View  Result Date: 06/08/2017 CLINICAL DATA:  Angina at rest EXAM: PORTABLE CHEST 1 VIEW COMPARISON:  04-Feb-2017 FINDINGS: Cardiomegaly with vascular congestion and bilateral interstitial/ alveolar opacities compatible with edema/ CHF. No visible effusions. No acute bony abnormality. IMPRESSION: Stable edema/CHF Electronically Signed   By: Charlett NoseKevin  Dover M.D.   On: 06/08/2017 07:50    Cardiac Studies   Echocardiogram 06/06/2017: Study Conclusions  - Left ventricle: The cavity size was normal. Wall thickness was   increased in a pattern of moderate LVH. Systolic function was   mildly to moderately  reduced. The estimated ejection fraction was   in the range of 40% to 45%. - Mitral valve: There was mild regurgitation. - Left atrium: The atrium was mildly dilated.  Cardiac catheterization 05/27/2017:  Ost LM lesion, 90 %stenosed.  Ost LAD to Prox LAD lesion, 75 %stenosed.  Prox LAD to Mid LAD lesion, 50 %stenosed.  Ost Cx to Prox Cx lesion, 80 %stenosed.  Prox RCA to Mid RCA lesion, 85 %stenosed.  Mid RCA to Dist RCA lesion, 50 %stenosed.  LV end diastolic pressure is normal.  Hemodynamic findings consistent with mild pulmonary hypertension.  LV end diastolic pressure is normal.   1. Severe left main and 3 vessel obstructive CAD    - 90% ostial left main. Significant dampening of pressures with catheter engagement    - 75% ostial LAD    - 80% proximal LCx    - 85% RCA 2. Normal LV filling pressures 3. Mild pulmonary HTN   Patient Profile     76 y.o. male with history of hypertension, hyperlipidemia, type 2 diabetes mellitus, and probable CKD stage III, presenting with NSTEMI and acute systolic heart failure. Cardiac catheterization reveals severe left main/multivessel disease and TCTS consultation is pending regarding CABG. His LVEF is 40-45% by echocardiography.  Assessment & Plan    1. Left main/multivessel CAD by cardiac catheterization on 7/27. TCTS consultation pending regarding CABG.  2. NSTEMI, peak troponin I 1.49. No active chest pain at this time. He continues on heparin.  3. CKD stage 3, creatinine 1.9. Currently not on ACE inhibitor or ARB.  4. Essential hypertension. Currently on Norvasc and Lopressor.  5. Ischemic cardiomyopathy, LVEF 40-45%. Still has evidence of acute systolic heart failure with fluid overload, although symptomatically he does feel better.  6. Type 2 diabetes mellitus, on insulin. Hemoglobin A1c 6.4.  Continue aspirin, Norvasc, Lipitor, and heparin. Change Lopressor to Coreg. Was on lisinopril HCTZ as an outpatient, would not  add back at this point. Follow-up BMET. Likely need to institute Lasix at least temporarily to further manage volume status if renal function tolerates. Follow-up TCTS recommendations regarding CABG.  Signed, Nona DellSamuel Jesstin Studstill, MD  06/08/2017, 7:55 AM

## 2017-06-08 NOTE — Progress Notes (Signed)
ABG was ordered for patient, however venous sample was obtained.  Per MD, no further sticks required.   Ref. Range 06/08/2017 15:05  Sample type Unknown VENOUS  pH, Ven Latest Ref Range: 7.250 - 7.430  7.402  pCO2, Ven Latest Ref Range: 44.0 - 60.0 mmHg 46.0  pO2, Ven Latest Ref Range: 32.0 - 45.0 mmHg 32.0  TCO2 Latest Ref Range: 0 - 100 mmol/L 30  Acid-Base Excess Latest Ref Range: 0.0 - 2.0 mmol/L 3.0 (H)  Bicarbonate Latest Ref Range: 20.0 - 28.0 mmol/L 28.6 (H)  O2 Saturation Latest Units: % 62.0  Patient temperature Unknown 98.6 F  Collection site Unknown RADIAL, ALLEN'S T.Marland Kitchen..Marland Kitchen

## 2017-06-09 ENCOUNTER — Encounter (HOSPITAL_COMMUNITY): Payer: Self-pay | Admitting: Certified Registered Nurse Anesthetist

## 2017-06-09 ENCOUNTER — Inpatient Hospital Stay (HOSPITAL_COMMUNITY): Admission: EM | Disposition: E | Payer: Self-pay | Source: Home / Self Care | Attending: Cardiothoracic Surgery

## 2017-06-09 ENCOUNTER — Inpatient Hospital Stay (HOSPITAL_COMMUNITY): Payer: Medicare Other

## 2017-06-09 ENCOUNTER — Inpatient Hospital Stay (HOSPITAL_COMMUNITY): Payer: Medicare Other | Admitting: Anesthesiology

## 2017-06-09 DIAGNOSIS — Z951 Presence of aortocoronary bypass graft: Secondary | ICD-10-CM

## 2017-06-09 HISTORY — PX: CORONARY ARTERY BYPASS GRAFT: SHX141

## 2017-06-09 LAB — POCT I-STAT 3, ART BLOOD GAS (G3+)
ACID-BASE EXCESS: 5 mmol/L — AB (ref 0.0–2.0)
ACID-BASE EXCESS: 6 mmol/L — AB (ref 0.0–2.0)
ACID-BASE EXCESS: 3 mmol/L — AB (ref 0.0–2.0)
ACID-BASE EXCESS: 1 mmol/L (ref 0.0–2.0)
Acid-Base Excess: 5 mmol/L — ABNORMAL HIGH (ref 0.0–2.0)
Acid-Base Excess: 4 mmol/L — ABNORMAL HIGH (ref 0.0–2.0)
Acid-base deficit: 9 mmol/L — ABNORMAL HIGH (ref 0.0–2.0)
Acid-base deficit: 3 mmol/L — ABNORMAL HIGH (ref 0.0–2.0)
BICARBONATE: 28.1 mmol/L — AB (ref 20.0–28.0)
BICARBONATE: 27.4 mmol/L (ref 20.0–28.0)
BICARBONATE: 28.1 mmol/L — AB (ref 20.0–28.0)
BICARBONATE: 25.7 mmol/L (ref 20.0–28.0)
BICARBONATE: 16.8 mmol/L — AB (ref 20.0–28.0)
Bicarbonate: 28.4 mmol/L — ABNORMAL HIGH (ref 20.0–28.0)
Bicarbonate: 30 mmol/L — ABNORMAL HIGH (ref 20.0–28.0)
Bicarbonate: 22 mmol/L (ref 20.0–28.0)
O2 SAT: 100 %
O2 SAT: 100 %
O2 SAT: 100 %
O2 SAT: 97 %
O2 SAT: 97 %
O2 Saturation: 100 %
O2 Saturation: 100 %
O2 Saturation: 100 %
PCO2 ART: 36.4 mmHg (ref 32.0–48.0)
PCO2 ART: 38.7 mmHg (ref 32.0–48.0)
PCO2 ART: 33 mmHg (ref 32.0–48.0)
PCO2 ART: 39.7 mmHg (ref 32.0–48.0)
PH ART: 7.499 — AB (ref 7.350–7.450)
PH ART: 7.469 — AB (ref 7.350–7.450)
PO2 ART: 367 mmHg — AB (ref 83.0–108.0)
PO2 ART: 328 mmHg — AB (ref 83.0–108.0)
PO2 ART: 373 mmHg — AB (ref 83.0–108.0)
PO2 ART: 275 mmHg — AB (ref 83.0–108.0)
PO2 ART: 89 mmHg (ref 83.0–108.0)
PO2 ART: 95 mmHg (ref 83.0–108.0)
Patient temperature: 35.9
Patient temperature: 36.7
TCO2: 29 mmol/L (ref 0–100)
TCO2: 29 mmol/L (ref 0–100)
TCO2: 31 mmol/L (ref 0–100)
TCO2: 29 mmol/L (ref 0–100)
TCO2: 29 mmol/L (ref 0–100)
TCO2: 27 mmol/L (ref 0–100)
TCO2: 18 mmol/L (ref 0–100)
TCO2: 23 mmol/L (ref 0–100)
pCO2 arterial: 36.6 mmHg (ref 32.0–48.0)
pCO2 arterial: 39.4 mmHg (ref 32.0–48.0)
pCO2 arterial: 40.3 mmHg (ref 32.0–48.0)
pCO2 arterial: 40.5 mmHg (ref 32.0–48.0)
pH, Arterial: 7.494 — ABNORMAL HIGH (ref 7.350–7.450)
pH, Arterial: 7.49 — ABNORMAL HIGH (ref 7.350–7.450)
pH, Arterial: 7.441 (ref 7.350–7.450)
pH, Arterial: 7.411 (ref 7.350–7.450)
pH, Arterial: 7.309 — ABNORMAL LOW (ref 7.350–7.450)
pH, Arterial: 7.351 (ref 7.350–7.450)
pO2, Arterial: 423 mmHg — ABNORMAL HIGH (ref 83.0–108.0)
pO2, Arterial: 413 mmHg — ABNORMAL HIGH (ref 83.0–108.0)

## 2017-06-09 LAB — POCT I-STAT, CHEM 8
BUN: 27 mg/dL — AB (ref 6–20)
BUN: 24 mg/dL — AB (ref 6–20)
BUN: 22 mg/dL — AB (ref 6–20)
BUN: 22 mg/dL — AB (ref 6–20)
BUN: 23 mg/dL — ABNORMAL HIGH (ref 6–20)
BUN: 23 mg/dL — ABNORMAL HIGH (ref 6–20)
BUN: 24 mg/dL — AB (ref 6–20)
BUN: 23 mg/dL — ABNORMAL HIGH (ref 6–20)
CHLORIDE: 99 mmol/L — AB (ref 101–111)
CHLORIDE: 98 mmol/L — AB (ref 101–111)
CHLORIDE: 100 mmol/L — AB (ref 101–111)
CHLORIDE: 100 mmol/L — AB (ref 101–111)
CREATININE: 1.3 mg/dL — AB (ref 0.61–1.24)
CREATININE: 1 mg/dL (ref 0.61–1.24)
CREATININE: 1.1 mg/dL (ref 0.61–1.24)
CREATININE: 1.1 mg/dL (ref 0.61–1.24)
CREATININE: 1.2 mg/dL (ref 0.61–1.24)
Calcium, Ion: 1.17 mmol/L (ref 1.15–1.40)
Calcium, Ion: 1.09 mmol/L — ABNORMAL LOW (ref 1.15–1.40)
Calcium, Ion: 0.97 mmol/L — ABNORMAL LOW (ref 1.15–1.40)
Calcium, Ion: 1.05 mmol/L — ABNORMAL LOW (ref 1.15–1.40)
Calcium, Ion: 1.04 mmol/L — ABNORMAL LOW (ref 1.15–1.40)
Calcium, Ion: 1.02 mmol/L — ABNORMAL LOW (ref 1.15–1.40)
Calcium, Ion: 1 mmol/L — ABNORMAL LOW (ref 1.15–1.40)
Calcium, Ion: 1.05 mmol/L — ABNORMAL LOW (ref 1.15–1.40)
Chloride: 101 mmol/L (ref 101–111)
Chloride: 103 mmol/L (ref 101–111)
Chloride: 101 mmol/L (ref 101–111)
Chloride: 101 mmol/L (ref 101–111)
Creatinine, Ser: 1.3 mg/dL — ABNORMAL HIGH (ref 0.61–1.24)
Creatinine, Ser: 1.1 mg/dL (ref 0.61–1.24)
Creatinine, Ser: 1.4 mg/dL — ABNORMAL HIGH (ref 0.61–1.24)
GLUCOSE: 165 mg/dL — AB (ref 65–99)
Glucose, Bld: 106 mg/dL — ABNORMAL HIGH (ref 65–99)
Glucose, Bld: 99 mg/dL (ref 65–99)
Glucose, Bld: 88 mg/dL (ref 65–99)
Glucose, Bld: 88 mg/dL (ref 65–99)
Glucose, Bld: 107 mg/dL — ABNORMAL HIGH (ref 65–99)
Glucose, Bld: 140 mg/dL — ABNORMAL HIGH (ref 65–99)
Glucose, Bld: 137 mg/dL — ABNORMAL HIGH (ref 65–99)
HCT: 25 % — ABNORMAL LOW (ref 39.0–52.0)
HEMATOCRIT: 33 % — AB (ref 39.0–52.0)
HEMATOCRIT: 29 % — AB (ref 39.0–52.0)
HEMATOCRIT: 26 % — AB (ref 39.0–52.0)
HEMATOCRIT: 25 % — AB (ref 39.0–52.0)
HEMATOCRIT: 26 % — AB (ref 39.0–52.0)
HEMATOCRIT: 29 % — AB (ref 39.0–52.0)
HEMATOCRIT: 29 % — AB (ref 39.0–52.0)
HEMOGLOBIN: 9.9 g/dL — AB (ref 13.0–17.0)
HEMOGLOBIN: 8.8 g/dL — AB (ref 13.0–17.0)
HEMOGLOBIN: 9.9 g/dL — AB (ref 13.0–17.0)
Hemoglobin: 11.2 g/dL — ABNORMAL LOW (ref 13.0–17.0)
Hemoglobin: 8.5 g/dL — ABNORMAL LOW (ref 13.0–17.0)
Hemoglobin: 8.8 g/dL — ABNORMAL LOW (ref 13.0–17.0)
Hemoglobin: 8.5 g/dL — ABNORMAL LOW (ref 13.0–17.0)
Hemoglobin: 9.9 g/dL — ABNORMAL LOW (ref 13.0–17.0)
POTASSIUM: 4.6 mmol/L (ref 3.5–5.1)
POTASSIUM: 3.8 mmol/L (ref 3.5–5.1)
POTASSIUM: 3.3 mmol/L — AB (ref 3.5–5.1)
POTASSIUM: 3.5 mmol/L (ref 3.5–5.1)
POTASSIUM: 3.9 mmol/L (ref 3.5–5.1)
POTASSIUM: 4.4 mmol/L (ref 3.5–5.1)
POTASSIUM: 4.1 mmol/L (ref 3.5–5.1)
POTASSIUM: 3.7 mmol/L (ref 3.5–5.1)
SODIUM: 136 mmol/L (ref 135–145)
SODIUM: 136 mmol/L (ref 135–145)
SODIUM: 136 mmol/L (ref 135–145)
Sodium: 138 mmol/L (ref 135–145)
Sodium: 138 mmol/L (ref 135–145)
Sodium: 137 mmol/L (ref 135–145)
Sodium: 137 mmol/L (ref 135–145)
Sodium: 138 mmol/L (ref 135–145)
TCO2: 27 mmol/L (ref 0–100)
TCO2: 25 mmol/L (ref 0–100)
TCO2: 22 mmol/L (ref 0–100)
TCO2: 28 mmol/L (ref 0–100)
TCO2: 28 mmol/L (ref 0–100)
TCO2: 26 mmol/L (ref 0–100)
TCO2: 25 mmol/L (ref 0–100)
TCO2: 23 mmol/L (ref 0–100)

## 2017-06-09 LAB — GLUCOSE, CAPILLARY
GLUCOSE-CAPILLARY: 138 mg/dL — AB (ref 65–99)
GLUCOSE-CAPILLARY: 140 mg/dL — AB (ref 65–99)
GLUCOSE-CAPILLARY: 146 mg/dL — AB (ref 65–99)
GLUCOSE-CAPILLARY: 146 mg/dL — AB (ref 65–99)
Glucose-Capillary: 141 mg/dL — ABNORMAL HIGH (ref 65–99)
Glucose-Capillary: 153 mg/dL — ABNORMAL HIGH (ref 65–99)
Glucose-Capillary: 153 mg/dL — ABNORMAL HIGH (ref 65–99)

## 2017-06-09 LAB — ECHO TEE
E decel time: 218 msec
FS: 31 % (ref 28–44)
IVS/LV PW RATIO, ED: 1.13
LV PW d: 15 mm — AB (ref 0.6–1.1)
LVOT area: 3.46 cm2
LVOT diameter: 21 mm
MV Dec: 218
MV Peak grad: 5 mmHg
MV pk A vel: 58.1 m/s
MV pk E vel: 109 m/s

## 2017-06-09 LAB — CBC
HCT: 31.3 % — ABNORMAL LOW (ref 39.0–52.0)
HCT: 27.8 % — ABNORMAL LOW (ref 39.0–52.0)
HEMATOCRIT: 35.8 % — AB (ref 39.0–52.0)
Hemoglobin: 12 g/dL — ABNORMAL LOW (ref 13.0–17.0)
Hemoglobin: 10.4 g/dL — ABNORMAL LOW (ref 13.0–17.0)
Hemoglobin: 9.3 g/dL — ABNORMAL LOW (ref 13.0–17.0)
MCH: 29.1 pg (ref 26.0–34.0)
MCH: 28.6 pg (ref 26.0–34.0)
MCH: 28.9 pg (ref 26.0–34.0)
MCHC: 33.5 g/dL (ref 30.0–36.0)
MCHC: 33.2 g/dL (ref 30.0–36.0)
MCHC: 33.5 g/dL (ref 30.0–36.0)
MCV: 86.9 fL (ref 78.0–100.0)
MCV: 86 fL (ref 78.0–100.0)
MCV: 86.3 fL (ref 78.0–100.0)
PLATELETS: 211 10*3/uL (ref 150–400)
Platelets: 204 10*3/uL (ref 150–400)
Platelets: 204 10*3/uL (ref 150–400)
RBC: 4.12 MIL/uL — AB (ref 4.22–5.81)
RBC: 3.64 MIL/uL — ABNORMAL LOW (ref 4.22–5.81)
RBC: 3.22 MIL/uL — ABNORMAL LOW (ref 4.22–5.81)
RDW: 14.7 % (ref 11.5–15.5)
RDW: 14.6 % (ref 11.5–15.5)
RDW: 14.8 % (ref 11.5–15.5)
WBC: 8.9 10*3/uL (ref 4.0–10.5)
WBC: 25.6 10*3/uL — ABNORMAL HIGH (ref 4.0–10.5)
WBC: 19.2 10*3/uL — ABNORMAL HIGH (ref 4.0–10.5)

## 2017-06-09 LAB — BASIC METABOLIC PANEL
Anion gap: 6 (ref 5–15)
Anion gap: 7 (ref 5–15)
BUN: 26 mg/dL — ABNORMAL HIGH (ref 6–20)
BUN: 27 mg/dL — AB (ref 6–20)
CALCIUM: 8 mg/dL — AB (ref 8.9–10.3)
CHLORIDE: 100 mmol/L — AB (ref 101–111)
CO2: 26 mmol/L (ref 22–32)
CO2: 26 mmol/L (ref 22–32)
CREATININE: 1.51 mg/dL — AB (ref 0.61–1.24)
Calcium: 8.2 mg/dL — ABNORMAL LOW (ref 8.9–10.3)
Chloride: 100 mmol/L — ABNORMAL LOW (ref 101–111)
Creatinine, Ser: 1.49 mg/dL — ABNORMAL HIGH (ref 0.61–1.24)
GFR calc Af Amer: 52 mL/min — ABNORMAL LOW (ref >60)
GFR calc non Af Amer: 44 mL/min — ABNORMAL LOW (ref >60)
GFR calc non Af Amer: 45 mL/min — ABNORMAL LOW (ref >60)
GFR, EST AFRICAN AMERICAN: 51 mL/min — AB (ref >60)
Glucose, Bld: 119 mg/dL — ABNORMAL HIGH (ref 65–99)
Glucose, Bld: 98 mg/dL (ref 65–99)
Potassium: 3.9 mmol/L (ref 3.5–5.1)
Potassium: 4.2 mmol/L (ref 3.5–5.1)
SODIUM: 132 mmol/L — AB (ref 135–145)
Sodium: 133 mmol/L — ABNORMAL LOW (ref 135–145)

## 2017-06-09 LAB — CREATININE, SERUM
Creatinine, Ser: 1.55 mg/dL — ABNORMAL HIGH (ref 0.61–1.24)
GFR calc Af Amer: 50 mL/min — ABNORMAL LOW (ref >60)
GFR calc non Af Amer: 43 mL/min — ABNORMAL LOW (ref >60)

## 2017-06-09 LAB — VAS US DOPPLER PRE CABG
LEFT ECA DIAS: -4 cm/s
LEFT VERTEBRAL DIAS: -16 cm/s
Left CCA dist dias: -10 cm/s
Left CCA dist sys: -95 cm/s
Left CCA prox dias: 13 cm/s
Left CCA prox sys: 101 cm/s
Left ICA dist dias: -33 cm/s
Left ICA dist sys: -267 cm/s
Left ICA prox dias: -52 cm/s
Left ICA prox sys: -291 cm/s
RIGHT ECA DIAS: -1 cm/s
RIGHT VERTEBRAL DIAS: -10 cm/s
Right CCA prox dias: 16 cm/s
Right CCA prox sys: 119 cm/s
Right cca dist sys: -114 cm/s

## 2017-06-09 LAB — PREPARE RBC (CROSSMATCH)

## 2017-06-09 LAB — POCT I-STAT 4, (NA,K, GLUC, HGB,HCT)
GLUCOSE: 175 mg/dL — AB (ref 65–99)
HEMATOCRIT: 33 % — AB (ref 39.0–52.0)
HEMOGLOBIN: 11.2 g/dL — AB (ref 13.0–17.0)
POTASSIUM: 4.1 mmol/L (ref 3.5–5.1)
SODIUM: 137 mmol/L (ref 135–145)

## 2017-06-09 LAB — PROTIME-INR
INR: 1.47
Prothrombin Time: 17.9 seconds — ABNORMAL HIGH (ref 11.4–15.2)

## 2017-06-09 LAB — HEPARIN LEVEL (UNFRACTIONATED): Heparin Unfractionated: 0.44 IU/mL (ref 0.30–0.70)

## 2017-06-09 LAB — HEMOGLOBIN A1C
Hgb A1c MFr Bld: 6.3 % — ABNORMAL HIGH (ref 4.8–5.6)
Mean Plasma Glucose: 134 mg/dL

## 2017-06-09 LAB — HEMOGLOBIN AND HEMATOCRIT, BLOOD
HCT: 23.8 % — ABNORMAL LOW (ref 39.0–52.0)
Hemoglobin: 7.9 g/dL — ABNORMAL LOW (ref 13.0–17.0)

## 2017-06-09 LAB — APTT: APTT: 42 s — AB (ref 24–36)

## 2017-06-09 LAB — PLATELET COUNT: Platelets: 156 10*3/uL (ref 150–400)

## 2017-06-09 LAB — MAGNESIUM: Magnesium: 3.2 mg/dL — ABNORMAL HIGH (ref 1.7–2.4)

## 2017-06-09 SURGERY — CORONARY ARTERY BYPASS GRAFTING (CABG)
Anesthesia: General | Site: Chest

## 2017-06-09 MED ORDER — MORPHINE SULFATE (PF) 4 MG/ML IV SOLN
1.0000 mg | INTRAVENOUS | Status: AC | PRN
  Administered 2017-06-09: 2 mg via INTRAVENOUS

## 2017-06-09 MED ORDER — BISACODYL 5 MG PO TBEC
10.0000 mg | DELAYED_RELEASE_TABLET | Freq: Every day | ORAL | Status: DC
  Administered 2017-06-14 – 2017-06-16 (×3): 10 mg via ORAL
  Filled 2017-06-09 (×3): qty 2

## 2017-06-09 MED ORDER — SODIUM CHLORIDE 0.9 % IV SOLN
0.0000 ug/kg/h | INTRAVENOUS | Status: DC
  Administered 2017-06-09: 0.1 ug/kg/h via INTRAVENOUS
  Administered 2017-06-09: 0.5 ug/kg/h via INTRAVENOUS
  Administered 2017-06-10 (×3): 0.1 ug/kg/h via INTRAVENOUS
  Filled 2017-06-09 (×5): qty 2

## 2017-06-09 MED ORDER — MORPHINE SULFATE (PF) 4 MG/ML IV SOLN
2.0000 mg | INTRAVENOUS | Status: DC | PRN
  Filled 2017-06-09: qty 1

## 2017-06-09 MED ORDER — ORAL CARE MOUTH RINSE
15.0000 mL | Freq: Four times a day (QID) | OROMUCOSAL | Status: DC
  Administered 2017-06-09 – 2017-06-14 (×18): 15 mL via OROMUCOSAL

## 2017-06-09 MED ORDER — ACETAMINOPHEN 160 MG/5ML PO SOLN
1000.0000 mg | Freq: Four times a day (QID) | ORAL | Status: AC
  Administered 2017-06-09 – 2017-06-14 (×6): 1000 mg
  Filled 2017-06-09 (×6): qty 40.6

## 2017-06-09 MED ORDER — HEMOSTATIC AGENTS (NO CHARGE) OPTIME
TOPICAL | Status: DC | PRN
  Administered 2017-06-09: 1 via TOPICAL

## 2017-06-09 MED ORDER — CHLORHEXIDINE GLUCONATE 0.12% ORAL RINSE (MEDLINE KIT)
15.0000 mL | Freq: Two times a day (BID) | OROMUCOSAL | Status: DC
  Administered 2017-06-09 – 2017-06-14 (×10): 15 mL via OROMUCOSAL

## 2017-06-09 MED ORDER — MILRINONE LACTATE IN DEXTROSE 20-5 MG/100ML-% IV SOLN
0.3750 ug/kg/min | INTRAVENOUS | Status: DC
  Administered 2017-06-09: 0.25 ug/kg/min via INTRAVENOUS
  Filled 2017-06-09: qty 100

## 2017-06-09 MED ORDER — ALBUMIN HUMAN 5 % IV SOLN
250.0000 mL | INTRAVENOUS | Status: AC | PRN
  Administered 2017-06-09 (×2): 250 mL via INTRAVENOUS

## 2017-06-09 MED ORDER — MIDAZOLAM HCL 10 MG/2ML IJ SOLN
INTRAMUSCULAR | Status: AC
  Filled 2017-06-09: qty 2

## 2017-06-09 MED ORDER — VECURONIUM BROMIDE 10 MG IV SOLR
INTRAVENOUS | Status: DC | PRN
  Administered 2017-06-09: 5 mg via INTRAVENOUS

## 2017-06-09 MED ORDER — GABAPENTIN 300 MG PO CAPS: 300.0000 mg | ORAL_CAPSULE | Freq: Two times a day (BID) | ORAL | Status: DC

## 2017-06-09 MED ORDER — ASPIRIN EC 325 MG PO TBEC: 325.0000 mg | DELAYED_RELEASE_TABLET | Freq: Every day | ORAL | Status: DC

## 2017-06-09 MED ORDER — POTASSIUM CHLORIDE 10 MEQ/50ML IV SOLN
10.0000 meq | INTRAVENOUS | Status: AC | PRN
  Administered 2017-06-09 (×3): 10 meq via INTRAVENOUS

## 2017-06-09 MED ORDER — SODIUM CHLORIDE 0.9 % IV SOLN: Freq: Once | INTRAVENOUS | Status: DC

## 2017-06-09 MED ORDER — ONDANSETRON HCL 4 MG/2ML IJ SOLN: 4.0000 mg | Freq: Four times a day (QID) | INTRAMUSCULAR | Status: DC | PRN

## 2017-06-09 MED ORDER — TRAMADOL HCL 50 MG PO TABS: 50.0000 mg | ORAL_TABLET | ORAL | Status: DC | PRN

## 2017-06-09 MED ORDER — LACTATED RINGERS IV SOLN
500.0000 mL | Freq: Once | INTRAVENOUS | Status: AC | PRN
  Administered 2017-06-09: 500 mL via INTRAVENOUS

## 2017-06-09 MED ORDER — SODIUM CHLORIDE 0.9 % IJ SOLN
OROMUCOSAL | Status: DC | PRN
  Administered 2017-06-09 (×3): via TOPICAL

## 2017-06-09 MED ORDER — SODIUM CHLORIDE 0.9 % IV SOLN
0.0000 ug/min | INTRAVENOUS | Status: DC
  Administered 2017-06-10: 25 ug/min via INTRAVENOUS
  Filled 2017-06-09 (×2): qty 2

## 2017-06-09 MED ORDER — FAMOTIDINE IN NACL 20-0.9 MG/50ML-% IV SOLN
20.0000 mg | Freq: Two times a day (BID) | INTRAVENOUS | Status: AC
  Administered 2017-06-09 (×2): 20 mg via INTRAVENOUS
  Filled 2017-06-09: qty 50

## 2017-06-09 MED ORDER — PROPOFOL 10 MG/ML IV BOLUS
INTRAVENOUS | Status: AC
  Filled 2017-06-09: qty 20

## 2017-06-09 MED ORDER — ACETAMINOPHEN 650 MG RE SUPP
650.0000 mg | Freq: Once | RECTAL | Status: AC
  Administered 2017-06-09: 650 mg via RECTAL

## 2017-06-09 MED ORDER — POTASSIUM CHLORIDE 10 MEQ/50ML IV SOLN: 10.0000 meq | INTRAVENOUS | Status: AC

## 2017-06-09 MED ORDER — SODIUM CHLORIDE 0.9% FLUSH: 3.0000 mL | INTRAVENOUS | Status: DC | PRN

## 2017-06-09 MED ORDER — DOCUSATE SODIUM 100 MG PO CAPS: 200.0000 mg | ORAL_CAPSULE | Freq: Every day | ORAL | Status: DC

## 2017-06-09 MED ORDER — TRANEXAMIC ACID 1000 MG/10ML IV SOLN
1.5000 mg/kg/h | INTRAVENOUS | Status: DC
  Filled 2017-06-09: qty 25

## 2017-06-09 MED ORDER — ACETAMINOPHEN 500 MG PO TABS: 1000.0000 mg | ORAL_TABLET | Freq: Four times a day (QID) | ORAL | Status: AC

## 2017-06-09 MED ORDER — LACTATED RINGERS IV SOLN
INTRAVENOUS | Status: DC | PRN
  Administered 2017-06-09: 08:00:00 via INTRAVENOUS

## 2017-06-09 MED ORDER — HEPARIN SODIUM (PORCINE) 1000 UNIT/ML IJ SOLN
INTRAMUSCULAR | Status: DC | PRN
  Administered 2017-06-09: 42000 [IU] via INTRAVENOUS
  Administered 2017-06-09: 3000 [IU] via INTRAVENOUS

## 2017-06-09 MED ORDER — HEMOSTATIC AGENTS (NO CHARGE) OPTIME
TOPICAL | Status: DC | PRN
  Administered 2017-06-09: 2 via TOPICAL

## 2017-06-09 MED ORDER — LACTATED RINGERS IV SOLN: INTRAVENOUS | Status: DC

## 2017-06-09 MED ORDER — LACTATED RINGERS IV SOLN
INTRAVENOUS | Status: DC | PRN
  Administered 2017-06-09: 07:00:00 via INTRAVENOUS

## 2017-06-09 MED ORDER — LACTATED RINGERS IV SOLN
INTRAVENOUS | Status: DC | PRN
  Administered 2017-06-09 (×3): via INTRAVENOUS

## 2017-06-09 MED ORDER — ASPIRIN 81 MG PO CHEW
324.0000 mg | CHEWABLE_TABLET | Freq: Every day | ORAL | Status: DC
  Administered 2017-06-11 – 2017-06-24 (×12): 324 mg
  Filled 2017-06-09 (×12): qty 4

## 2017-06-09 MED ORDER — SODIUM CHLORIDE 0.9 % IV SOLN
250.0000 mL | INTRAVENOUS | Status: DC
  Administered 2017-06-10: 250 mL via INTRAVENOUS

## 2017-06-09 MED ORDER — SODIUM CHLORIDE 0.9% FLUSH
3.0000 mL | Freq: Two times a day (BID) | INTRAVENOUS | Status: DC
  Administered 2017-06-10 – 2017-06-16 (×9): 3 mL via INTRAVENOUS

## 2017-06-09 MED ORDER — INSULIN REGULAR BOLUS VIA INFUSION
0.0000 [IU] | Freq: Three times a day (TID) | INTRAVENOUS | Status: DC
  Filled 2017-06-09: qty 10

## 2017-06-09 MED ORDER — ALBUMIN HUMAN 5 % IV SOLN
INTRAVENOUS | Status: DC | PRN
  Administered 2017-06-09: 14:00:00 via INTRAVENOUS

## 2017-06-09 MED ORDER — ACETAMINOPHEN 160 MG/5ML PO SOLN: 650.0000 mg | Freq: Once | ORAL | Status: AC

## 2017-06-09 MED ORDER — SODIUM CHLORIDE 0.9 % IV SOLN
0.0000 ug/min | INTRAVENOUS | Status: DC
  Administered 2017-06-09: 90 ug/min via INTRAVENOUS
  Filled 2017-06-09 (×2): qty 2

## 2017-06-09 MED ORDER — SODIUM CHLORIDE 0.9 % IV SOLN
INTRAVENOUS | Status: DC
  Administered 2017-06-09: 16:00:00 via INTRAVENOUS

## 2017-06-09 MED ORDER — VANCOMYCIN HCL IN DEXTROSE 1-5 GM/200ML-% IV SOLN
1000.0000 mg | Freq: Once | INTRAVENOUS | Status: AC
  Administered 2017-06-09: 1000 mg via INTRAVENOUS
  Filled 2017-06-09: qty 200

## 2017-06-09 MED ORDER — MIDAZOLAM HCL 5 MG/5ML IJ SOLN
INTRAMUSCULAR | Status: DC | PRN
  Administered 2017-06-09 (×3): 2 mg via INTRAVENOUS
  Administered 2017-06-09 (×2): 3 mg via INTRAVENOUS

## 2017-06-09 MED ORDER — MILRINONE LACTATE IN DEXTROSE 20-5 MG/100ML-% IV SOLN
0.3750 ug/kg/min | INTRAVENOUS | Status: DC
  Administered 2017-06-09 – 2017-06-24 (×32): 0.25 ug/kg/min via INTRAVENOUS
  Administered 2017-06-25: 0.375 ug/kg/min via INTRAVENOUS
  Filled 2017-06-09 (×34): qty 100

## 2017-06-09 MED ORDER — PROPOFOL 10 MG/ML IV BOLUS
INTRAVENOUS | Status: DC | PRN
  Administered 2017-06-09: 50 mg via INTRAVENOUS

## 2017-06-09 MED ORDER — ROCURONIUM BROMIDE 10 MG/ML (PF) SYRINGE
PREFILLED_SYRINGE | INTRAVENOUS | Status: DC | PRN
  Administered 2017-06-09: 70 mg via INTRAVENOUS

## 2017-06-09 MED ORDER — SUCCINYLCHOLINE CHLORIDE 20 MG/ML IJ SOLN
INTRAMUSCULAR | Status: DC | PRN
  Administered 2017-06-09: 100 mg via INTRAVENOUS

## 2017-06-09 MED ORDER — PROTAMINE SULFATE 10 MG/ML IV SOLN
INTRAVENOUS | Status: DC | PRN
  Administered 2017-06-09: 50 mg via INTRAVENOUS
  Administered 2017-06-09: 350 mg via INTRAVENOUS

## 2017-06-09 MED ORDER — METOPROLOL TARTRATE 5 MG/5ML IV SOLN: 2.5000 mg | INTRAVENOUS | Status: DC | PRN

## 2017-06-09 MED ORDER — FENTANYL CITRATE (PF) 250 MCG/5ML IJ SOLN
INTRAMUSCULAR | Status: DC | PRN
  Administered 2017-06-09: 50 ug via INTRAVENOUS
  Administered 2017-06-09 (×3): 100 ug via INTRAVENOUS
  Administered 2017-06-09: 50 ug via INTRAVENOUS
  Administered 2017-06-09: 150 ug via INTRAVENOUS
  Administered 2017-06-09: 450 ug via INTRAVENOUS

## 2017-06-09 MED ORDER — METOPROLOL TARTRATE 12.5 MG HALF TABLET: 12.5000 mg | ORAL_TABLET | Freq: Two times a day (BID) | ORAL | Status: DC

## 2017-06-09 MED ORDER — MIDAZOLAM HCL 2 MG/2ML IJ SOLN: 2.0000 mg | INTRAMUSCULAR | Status: DC | PRN

## 2017-06-09 MED ORDER — INSULIN REGULAR HUMAN 100 UNIT/ML IJ SOLN
INTRAMUSCULAR | Status: DC
  Filled 2017-06-09: qty 1

## 2017-06-09 MED ORDER — NITROGLYCERIN IN D5W 200-5 MCG/ML-% IV SOLN: 0.0000 ug/min | INTRAVENOUS | Status: DC

## 2017-06-09 MED ORDER — OXYCODONE HCL 5 MG PO TABS: 5.0000 mg | ORAL_TABLET | ORAL | Status: DC | PRN

## 2017-06-09 MED ORDER — ARTIFICIAL TEARS OPHTHALMIC OINT
TOPICAL_OINTMENT | OPHTHALMIC | Status: DC | PRN
  Administered 2017-06-09: 1 via OPHTHALMIC

## 2017-06-09 MED ORDER — SODIUM CHLORIDE 0.45 % IV SOLN
INTRAVENOUS | Status: DC | PRN
  Administered 2017-06-09: 15:00:00 via INTRAVENOUS

## 2017-06-09 MED ORDER — METOCLOPRAMIDE HCL 5 MG/ML IJ SOLN
10.0000 mg | Freq: Four times a day (QID) | INTRAMUSCULAR | Status: AC
  Administered 2017-06-09 – 2017-06-14 (×14): 10 mg via INTRAVENOUS
  Filled 2017-06-09 (×16): qty 2

## 2017-06-09 MED ORDER — FENTANYL CITRATE (PF) 250 MCG/5ML IJ SOLN
INTRAMUSCULAR | Status: AC
  Filled 2017-06-09: qty 20

## 2017-06-09 MED ORDER — DEXTROSE 5 % IV SOLN
0.0000 ug/min | INTRAVENOUS | Status: DC
  Administered 2017-06-09: 12 ug/min via INTRAVENOUS
  Administered 2017-06-09: 10 ug/min via INTRAVENOUS
  Administered 2017-06-10: 8 ug/min via INTRAVENOUS
  Filled 2017-06-09 (×3): qty 4

## 2017-06-09 MED ORDER — LIDOCAINE HCL (CARDIAC) 20 MG/ML IV SOLN
100.0000 mg | Freq: Once | INTRAVENOUS | Status: AC
  Administered 2017-06-09: 100 mg via INTRAVENOUS

## 2017-06-09 MED ORDER — LIDOCAINE IN D5W 4-5 MG/ML-% IV SOLN
2.0000 mg/min | INTRAVENOUS | Status: DC
  Administered 2017-06-09 – 2017-06-11 (×3): 2 mg/min via INTRAVENOUS
  Filled 2017-06-09 (×4): qty 500

## 2017-06-09 MED ORDER — 0.9 % SODIUM CHLORIDE (POUR BTL) OPTIME
TOPICAL | Status: DC | PRN
  Administered 2017-06-09: 6000 mL

## 2017-06-09 MED ORDER — BISACODYL 10 MG RE SUPP
10.0000 mg | Freq: Every day | RECTAL | Status: DC
  Administered 2017-06-11 – 2017-06-18 (×3): 10 mg via RECTAL
  Filled 2017-06-09 (×3): qty 1

## 2017-06-09 MED ORDER — DEXTROSE 5 % IV SOLN
1.5000 g | Freq: Two times a day (BID) | INTRAVENOUS | Status: AC
  Administered 2017-06-09 – 2017-06-11 (×4): 1.5 g via INTRAVENOUS
  Filled 2017-06-09 (×4): qty 1.5

## 2017-06-09 MED ORDER — ATORVASTATIN CALCIUM 80 MG PO TABS
80.0000 mg | ORAL_TABLET | Freq: Every day | ORAL | Status: DC
  Administered 2017-06-11 – 2017-06-18 (×6): 80 mg via ORAL
  Filled 2017-06-09 (×6): qty 1

## 2017-06-09 MED ORDER — METOPROLOL TARTRATE 25 MG/10 ML ORAL SUSPENSION
12.5000 mg | Freq: Two times a day (BID) | ORAL | Status: DC
  Administered 2017-06-09 – 2017-06-18 (×7): 12.5 mg
  Filled 2017-06-09 (×7): qty 5

## 2017-06-09 MED ORDER — SODIUM BICARBONATE 8.4 % IV SOLN
75.0000 meq | Freq: Once | INTRAVENOUS | Status: AC
Start: 2017-06-09 — End: 2017-06-09
  Administered 2017-06-09: 75 meq via INTRAVENOUS

## 2017-06-09 MED ORDER — LACTATED RINGERS IV SOLN
INTRAVENOUS | Status: DC
  Administered 2017-06-13: 17:00:00 via INTRAVENOUS

## 2017-06-09 MED ORDER — CHLORHEXIDINE GLUCONATE 0.12 % MT SOLN
15.0000 mL | OROMUCOSAL | Status: AC
  Administered 2017-06-09: 15 mL via OROMUCOSAL

## 2017-06-09 MED ORDER — DOPAMINE-DEXTROSE 3.2-5 MG/ML-% IV SOLN
3.0000 ug/kg/min | INTRAVENOUS | Status: DC
  Administered 2017-06-10 – 2017-06-12 (×2): 3 ug/kg/min via INTRAVENOUS
  Administered 2017-06-13: 4 ug/kg/min via INTRAVENOUS
  Administered 2017-06-14 (×2): 3 ug/kg/min via INTRAVENOUS
  Filled 2017-06-09 (×4): qty 250

## 2017-06-09 MED ORDER — MAGNESIUM SULFATE 4 GM/100ML IV SOLN
4.0000 g | Freq: Once | INTRAVENOUS | Status: AC
  Administered 2017-06-09: 4 g via INTRAVENOUS
  Filled 2017-06-09: qty 100

## 2017-06-09 MED ORDER — PANTOPRAZOLE SODIUM 40 MG PO TBEC: 40.0000 mg | DELAYED_RELEASE_TABLET | Freq: Every day | ORAL | Status: DC

## 2017-06-09 SURGICAL SUPPLY — 113 items
ADAPTER CARDIO PERF ANTE/RETRO (ADAPTER) ×3 IMPLANT
BAG DECANTER FOR FLEXI CONT (MISCELLANEOUS) ×3 IMPLANT
BANDAGE ACE 4X5 VEL STRL LF (GAUZE/BANDAGES/DRESSINGS) ×3 IMPLANT
BANDAGE ACE 6X5 VEL STRL LF (GAUZE/BANDAGES/DRESSINGS) ×3 IMPLANT
BANDAGE ELASTIC 6 VELCRO ST LF (GAUZE/BANDAGES/DRESSINGS) ×3 IMPLANT
BASKET HEART  (ORDER IN 25'S) (MISCELLANEOUS) ×1
BASKET HEART (ORDER IN 25'S) (MISCELLANEOUS) ×1
BASKET HEART (ORDER IN 25S) (MISCELLANEOUS) ×1 IMPLANT
BLADE CLIPPER SURG (BLADE) ×3 IMPLANT
BLADE STERNUM SYSTEM 6 (BLADE) ×3 IMPLANT
BLADE SURG 12 STRL SS (BLADE) ×3 IMPLANT
BNDG GAUZE ELAST 4 BULKY (GAUZE/BANDAGES/DRESSINGS) ×3 IMPLANT
CANISTER SUCT 3000ML PPV (MISCELLANEOUS) ×3 IMPLANT
CANNULA GUNDRY RCSP 15FR (MISCELLANEOUS) ×3 IMPLANT
CATH CPB KIT VANTRIGT (MISCELLANEOUS) ×3 IMPLANT
CATH ROBINSON RED A/P 18FR (CATHETERS) ×9 IMPLANT
CATH THORACIC 36FR RT ANG (CATHETERS) ×3 IMPLANT
CLIP RETRACTION 3.0MM CORONARY (MISCELLANEOUS) ×3 IMPLANT
CLIP TI WIDE RED SMALL 24 (CLIP) ×3 IMPLANT
COVER PROBE W GEL 5X96 (DRAPES) ×6 IMPLANT
CRADLE DONUT ADULT HEAD (MISCELLANEOUS) ×3 IMPLANT
DRAIN CHANNEL 32F RND 10.7 FF (WOUND CARE) ×3 IMPLANT
DRAPE CARDIOVASCULAR INCISE (DRAPES) ×2
DRAPE SLUSH/WARMER DISC (DRAPES) ×3 IMPLANT
DRAPE SRG 135X102X78XABS (DRAPES) ×1 IMPLANT
DRSG AQUACEL AG ADV 3.5X14 (GAUZE/BANDAGES/DRESSINGS) ×3 IMPLANT
ELECT BLADE 4.0 EZ CLEAN MEGAD (MISCELLANEOUS) ×3
ELECT BLADE 6.5 EXT (BLADE) ×3 IMPLANT
ELECT CAUTERY BLADE 6.4 (BLADE) ×3 IMPLANT
ELECT REM PT RETURN 9FT ADLT (ELECTROSURGICAL) ×6
ELECTRODE BLDE 4.0 EZ CLN MEGD (MISCELLANEOUS) ×1 IMPLANT
ELECTRODE REM PT RTRN 9FT ADLT (ELECTROSURGICAL) ×2 IMPLANT
FELT TEFLON 1X6 (MISCELLANEOUS) ×3 IMPLANT
GAUZE SPONGE 4X4 12PLY STRL (GAUZE/BANDAGES/DRESSINGS) ×6 IMPLANT
GAUZE SPONGE 4X4 12PLY STRL LF (GAUZE/BANDAGES/DRESSINGS) ×9 IMPLANT
GLOVE BIO SURGEON STRL SZ 6 (GLOVE) ×12 IMPLANT
GLOVE BIO SURGEON STRL SZ 6.5 (GLOVE) ×2 IMPLANT
GLOVE BIO SURGEON STRL SZ7.5 (GLOVE) ×9 IMPLANT
GLOVE BIO SURGEONS STRL SZ 6.5 (GLOVE) ×1
GLOVE BIOGEL PI IND STRL 6 (GLOVE) ×2 IMPLANT
GLOVE BIOGEL PI IND STRL 6.5 (GLOVE) ×1 IMPLANT
GLOVE BIOGEL PI INDICATOR 6 (GLOVE) ×4
GLOVE BIOGEL PI INDICATOR 6.5 (GLOVE) ×2
GLOVE SKINSENSE STRL SZ6.0 (GLOVE) ×9 IMPLANT
GOWN STRL REUS W/ TWL LRG LVL3 (GOWN DISPOSABLE) ×9 IMPLANT
GOWN STRL REUS W/TWL LRG LVL3 (GOWN DISPOSABLE) ×18
HEMOSTAT POWDER SURGIFOAM 1G (HEMOSTASIS) ×9 IMPLANT
HEMOSTAT SURGICEL 2X14 (HEMOSTASIS) ×3 IMPLANT
INSERT FOGARTY XLG (MISCELLANEOUS) IMPLANT
KIT BASIN OR (CUSTOM PROCEDURE TRAY) ×3 IMPLANT
KIT ROOM TURNOVER OR (KITS) ×3 IMPLANT
KIT SUCTION CATH 14FR (SUCTIONS) ×3 IMPLANT
KIT VASOVIEW HEMOPRO VH 3000 (KITS) ×3 IMPLANT
LEAD PACING MYOCARDI (MISCELLANEOUS) ×3 IMPLANT
MARKER GRAFT CORONARY BYPASS (MISCELLANEOUS) ×9 IMPLANT
NS IRRIG 1000ML POUR BTL (IV SOLUTION) ×15 IMPLANT
PACK OPEN HEART (CUSTOM PROCEDURE TRAY) ×3 IMPLANT
PAD ARMBOARD 7.5X6 YLW CONV (MISCELLANEOUS) ×6 IMPLANT
PAD ELECT DEFIB RADIOL ZOLL (MISCELLANEOUS) ×3 IMPLANT
PENCIL BUTTON HOLSTER BLD 10FT (ELECTRODE) ×3 IMPLANT
PUNCH AORTIC ROTATE  4.5MM 8IN (MISCELLANEOUS) ×3 IMPLANT
PUNCH AORTIC ROTATE 4.0MM (MISCELLANEOUS) IMPLANT
PUNCH AORTIC ROTATE 4.5MM 8IN (MISCELLANEOUS) IMPLANT
PUNCH AORTIC ROTATE 5MM 8IN (MISCELLANEOUS) IMPLANT
SET CARDIOPLEGIA MPS 5001102 (MISCELLANEOUS) ×3 IMPLANT
SPOGE SURGIFLO 8M (HEMOSTASIS) ×4
SPONGE LAP 18X18 X RAY DECT (DISPOSABLE) ×6 IMPLANT
SPONGE LAP 4X18 X RAY DECT (DISPOSABLE) ×6 IMPLANT
SPONGE SURGIFLO 8M (HEMOSTASIS) ×2 IMPLANT
SURGIFLO W/THROMBIN 8M KIT (HEMOSTASIS) ×3 IMPLANT
SUT BONE WAX W31G (SUTURE) ×3 IMPLANT
SUT MNCRL AB 4-0 PS2 18 (SUTURE) ×6 IMPLANT
SUT PROLENE 3 0 SH DA (SUTURE) ×21 IMPLANT
SUT PROLENE 3 0 SH1 36 (SUTURE) IMPLANT
SUT PROLENE 4 0 RB 1 (SUTURE) ×6
SUT PROLENE 4 0 SH DA (SUTURE) ×3 IMPLANT
SUT PROLENE 4-0 RB1 .5 CRCL 36 (SUTURE) ×3 IMPLANT
SUT PROLENE 5 0 C 1 36 (SUTURE) ×3 IMPLANT
SUT PROLENE 6 0 C 1 30 (SUTURE) ×12 IMPLANT
SUT PROLENE 6 0 CC (SUTURE) ×15 IMPLANT
SUT PROLENE 8 0 BV175 6 (SUTURE) ×39 IMPLANT
SUT PROLENE BLUE 7 0 (SUTURE) ×6 IMPLANT
SUT SILK  1 MH (SUTURE) ×6
SUT SILK 1 MH (SUTURE) ×3 IMPLANT
SUT SILK 1 TIES 10X30 (SUTURE) ×3 IMPLANT
SUT SILK 2 0 SH CR/8 (SUTURE) ×9 IMPLANT
SUT SILK 2 0 TIES 10X30 (SUTURE) ×3 IMPLANT
SUT SILK 2 0 TIES 17X18 (SUTURE) ×2
SUT SILK 2-0 18XBRD TIE BLK (SUTURE) ×1 IMPLANT
SUT SILK 3 0 SH CR/8 (SUTURE) ×9 IMPLANT
SUT SILK 4 0 TIE 10X30 (SUTURE) ×6 IMPLANT
SUT STEEL 6MS V (SUTURE) ×3 IMPLANT
SUT STEEL SZ 6 DBL 3X14 BALL (SUTURE) ×12 IMPLANT
SUT TEM PAC WIRE 2 0 SH (SUTURE) ×6 IMPLANT
SUT VIC AB 1 CTX 36 (SUTURE) ×8
SUT VIC AB 1 CTX36XBRD ANBCTR (SUTURE) ×4 IMPLANT
SUT VIC AB 2-0 CT1 27 (SUTURE) ×4
SUT VIC AB 2-0 CT1 TAPERPNT 27 (SUTURE) ×2 IMPLANT
SUT VIC AB 2-0 CTX 27 (SUTURE) IMPLANT
SUT VIC AB 2-0 CTX 36 (SUTURE) ×6 IMPLANT
SUT VIC AB 3-0 X1 27 (SUTURE) ×6 IMPLANT
SUTURE E-PAK OPEN HEART (SUTURE) IMPLANT
SYSTEM SAHARA CHEST DRAIN ATS (WOUND CARE) ×3 IMPLANT
TAPE CLOTH SURG 4X10 WHT LF (GAUZE/BANDAGES/DRESSINGS) ×9 IMPLANT
TAPE PAPER 2X10 WHT MICROPORE (GAUZE/BANDAGES/DRESSINGS) ×3 IMPLANT
TOWEL GREEN STERILE (TOWEL DISPOSABLE) ×12 IMPLANT
TOWEL GREEN STERILE FF (TOWEL DISPOSABLE) ×6 IMPLANT
TOWEL OR 17X24 6PK STRL BLUE (TOWEL DISPOSABLE) IMPLANT
TOWEL OR 17X26 10 PK STRL BLUE (TOWEL DISPOSABLE) ×3 IMPLANT
TRAY FOLEY SILVER 16FR TEMP (SET/KITS/TRAYS/PACK) ×3 IMPLANT
TUBING INSUFFLATION (TUBING) ×3 IMPLANT
UNDERPAD 30X30 (UNDERPADS AND DIAPERS) ×3 IMPLANT
WATER STERILE IRR 1000ML POUR (IV SOLUTION) ×6 IMPLANT

## 2017-06-09 NOTE — Transfer of Care (Signed)
Immediate Anesthesia Transfer of Care Note  Patient: Elam DutchRobert Bascom  Procedure(s) Performed: Procedure(s): CORONARY ARTERY BYPASS GRAFTING (CABG) x3 WITH TEE USING LEFT INTERNAL MAMMARY ARTERY AND RIGHT  SAPHENOUS LEG VEIN HARVESTED ENDOSCOPICALLY (N/A)  Patient Location: SICU  Anesthesia Type:General  Level of Consciousness: sedated and Patient remains intubated per anesthesia plan  Airway & Oxygen Therapy: Patient remains intubated per anesthesia plan and Patient placed on Ventilator (see vital sign flow sheet for setting)  Post-op Assessment: Report given to RN and Post -op Vital signs reviewed and stable  Post vital signs: Reviewed and stable  Last Vitals:  Vitals:   05/25/2017 0500 06/02/2017 0600  BP: (!) 135/55 (!) 145/59  Pulse: 67 74  Resp: (!) 23 (!) 25  Temp:      Last Pain:  Vitals:   05/26/2017 0300  TempSrc: Oral  PainSc:       Patients Stated Pain Goal: 0 (06/08/17 2000)  Complications: No apparent anesthesia complications

## 2017-06-09 NOTE — Anesthesia Postprocedure Evaluation (Signed)
Anesthesia Post Note  Patient: Philip DutchRobert Love  Procedure(s) Performed: Procedure(s) (LRB): CORONARY ARTERY BYPASS GRAFTING (CABG) x3 WITH TEE USING LEFT INTERNAL MAMMARY ARTERY AND RIGHT  SAPHENOUS LEG VEIN HARVESTED ENDOSCOPICALLY (N/A)     Patient location during evaluation: SICU Anesthesia Type: General Level of consciousness: sedated Pain management: pain level controlled Vital Signs Assessment: post-procedure vital signs reviewed and stable Respiratory status: patient remains intubated per anesthesia plan Cardiovascular status: stable Anesthetic complications: no    Last Vitals:  Vitals:   05/23/2017 1915 06/08/2017 1930  BP:    Pulse: 91 89  Resp: 12 12  Temp: 36.9 C 36.9 C    Last Pain:  Vitals:   05/18/2017 1907  TempSrc:   PainSc: Asleep                 Landrie Beale,W. EDMOND

## 2017-06-09 NOTE — Anesthesia Procedure Notes (Signed)
Central Venous Catheter Insertion Performed by: Roderic Palau, anesthesiologist Start/End07/10/2017 7:15 AM, 05/31/2017 7:25 AM Patient location: Pre-op. Preanesthetic checklist: patient identified, IV checked, site marked, risks and benefits discussed, surgical consent, monitors and equipment checked, pre-op evaluation, timeout performed and anesthesia consent Position: Trendelenburg Lidocaine 1% used for infiltration and patient sedated Hand hygiene performed , maximum sterile barriers used  and Seldinger technique used Catheter size: 9 Fr Total catheter length 10. Central line and PA cath was placed.MAC introducer Swan type:thermodilution PA Cath depth:50 Procedure performed using ultrasound guided technique. Ultrasound Notes:anatomy identified, needle tip was noted to be adjacent to the nerve/plexus identified, no ultrasound evidence of intravascular and/or intraneural injection and image(s) printed for medical record Attempts: 1 Following insertion, line sutured and dressing applied. Post procedure assessment: blood return through all ports, free fluid flow and no air  Patient tolerated the procedure well with no immediate complications.

## 2017-06-09 NOTE — Anesthesia Preprocedure Evaluation (Addendum)
Anesthesia Evaluation  Patient identified by MRN, date of birth, ID band Patient awake    Reviewed: Allergy & Precautions, H&P , NPO status , Patient's Chart, lab work & pertinent test results, reviewed documented beta blocker date and time   Airway Mallampati: III  TM Distance: >3 FB Neck ROM: Full    Dental no notable dental hx. (+) Teeth Intact, Dental Advisory Given   Pulmonary neg pulmonary ROS, former smoker,    Pulmonary exam normal breath sounds clear to auscultation       Cardiovascular hypertension, Pt. on medications and Pt. on home beta blockers + CAD and + Past MI   Rhythm:Regular Rate:Normal     Neuro/Psych negative neurological ROS  negative psych ROS   GI/Hepatic negative GI ROS, Neg liver ROS,   Endo/Other  negative endocrine ROSdiabetesMorbid obesity  Renal/GU Renal InsufficiencyRenal disease  negative genitourinary   Musculoskeletal  (+) Arthritis , Osteoarthritis,    Abdominal   Peds  Hematology negative hematology ROS (+)   Anesthesia Other Findings   Reproductive/Obstetrics negative OB ROS                            Anesthesia Physical Anesthesia Plan  ASA: IV  Anesthesia Plan: General   Post-op Pain Management:    Induction: Intravenous  PONV Risk Score and Plan: 2 and Treatment may vary due to age or medical condition and Midazolam  Airway Management Planned: Oral ETT  Additional Equipment: Arterial line, CVP, PA Cath, TEE and Ultrasound Guidance Line Placement  Intra-op Plan:   Post-operative Plan: Post-operative intubation/ventilation  Informed Consent: I have reviewed the patients History and Physical, chart, labs and discussed the procedure including the risks, benefits and alternatives for the proposed anesthesia with the patient or authorized representative who has indicated his/her understanding and acceptance.   Dental advisory given  Plan  Discussed with: CRNA  Anesthesia Plan Comments:        Anesthesia Quick Evaluation

## 2017-06-09 NOTE — Progress Notes (Signed)
   Progress Note  Patient Name: Philip Love Date of Encounter: 2017/03/18  Admitting Cardiologist: Dr. Dietrich PatesPaula Ross  Patient evaluated by Dr. Donata ClayVan Trigt yesterday, consult note reviewed and appreciated. He is currently in the OR for multivessel CABG this morning.  Signed, Nona DellSamuel McDowell, MD  2017/03/18, 7:22 AM

## 2017-06-09 NOTE — Anesthesia Procedure Notes (Signed)
Procedure Name: Intubation Date/Time: 05/26/2017 8:00 AM Performed by: Oletta Lamas Pre-anesthesia Checklist: Patient identified, Emergency Drugs available, Suction available and Patient being monitored Patient Re-evaluated:Patient Re-evaluated prior to induction Oxygen Delivery Method: Circle System Utilized Preoxygenation: Pre-oxygenation with 100% oxygen Induction Type: IV induction Ventilation: Two handed mask ventilation required and Oral airway inserted - appropriate to patient size Laryngoscope Size: Mac and 4 Grade View: Grade I Tube type: Oral Tube size: 8.0 mm Number of attempts: 1 Airway Equipment and Method: Stylet and Oral airway Placement Confirmation: ETT inserted through vocal cords under direct vision,  positive ETCO2 and breath sounds checked- equal and bilateral Secured at: 23 cm Tube secured with: Tape Dental Injury: Teeth and Oropharynx as per pre-operative assessment

## 2017-06-09 NOTE — Anesthesia Procedure Notes (Signed)
Arterial Line Insertion Start/End2018/09/20 7:17 AM, 2017/08/01 7:17 AM Performed by: CRNA  Preanesthetic checklist: patient identified, IV checked, site marked, risks and benefits discussed, surgical consent, monitors and equipment checked, pre-op evaluation and timeout performed Lidocaine 1% used for infiltration and patient sedated radial was placed Catheter size: 20 G Hand hygiene performed , maximum sterile barriers used  and Seldinger technique used Allen's test indicative of satisfactory collateral circulation Attempts: 1 Procedure performed without using ultrasound guided technique. Following insertion, Biopatch and dressing applied. Post procedure assessment: normal  Patient tolerated the procedure well with no immediate complications.

## 2017-06-09 NOTE — Progress Notes (Signed)
Day of Surgery Procedure(s) (LRB): CORONARY ARTERY BYPASS GRAFTING (CABG) x3 WITH TEE USING LEFT INTERNAL MAMMARY ARTERY AND RIGHT  SAPHENOUS LEG VEIN HARVESTED ENDOSCOPICALLY (N/A) Subjective: recovering after CABG x3 Follow urine output for hx CKD Objective: Vital signs in last 24 hours: Temp:  [96.1 F (35.6 C)-98.5 F (36.9 C)] 98.4 F (36.9 C) (07/29 1815) Pulse Rate:  [55-99] 90 (07/29 1815) Cardiac Rhythm: Normal sinus rhythm (07/29 1500) Resp:  [7-25] 12 (07/29 1815) BP: (115-157)/(40-88) 145/59 (07/29 0600) SpO2:  [89 %-100 %] 100 % (07/29 1815) Arterial Line BP: (98-148)/(44-55) 144/50 (07/29 1815) FiO2 (%):  [50 %] 50 % (07/29 1500) Weight:  [254 lb 10.1 oz (115.5 kg)] 254 lb 10.1 oz (115.5 kg) (07/29 0600)  Hemodynamic parameters for last 24 hours: PAP: (28-37)/(13-19) 30/13 CO:  [5.6 L/min-7.7 L/min] 6.6 L/min CI:  [2.5 L/min/m2-3.4 L/min/m2] 2.9 L/min/m2  Intake/Output from previous day: 07/28 0701 - 07/29 0700 In: 2872.1 [I.V.:2872.1] Out: 501 [Urine:500; Stool:1] Intake/Output this shift: Total I/O In: 5653.7 [I.V.:3913.7; Blood:940; IV Piggyback:800] Out: 2555 [Urine:1175; Blood:1200; Chest Tube:180]  Intubated sedated nsr  Lab Results:  Recent Labs  06/02/2017 0600  06/02/2017 1225  06/02/2017 1505 06/02/2017 1516  WBC 8.9  --   --   --   --  25.6*  HGB 12.0*  < > 7.9*  < > 11.2* 10.4*  HCT 35.8*  < > 23.8*  < > 33.0* 31.3*  PLT 204  --  156  --   --  211  < > = values in this interval not displayed. BMET:  Recent Labs  06/02/2017 0032 06/02/2017 0600  06/02/2017 1317 06/02/2017 1339 06/02/2017 1505  NA 132* 133*  < > 137 137 137  K 3.9 4.2  < > 4.4 4.1 4.1  CL 100* 100*  < > 100* 101  --   CO2 26 26  --   --   --   --   GLUCOSE 119* 98  < > 140* 165* 175*  BUN 27* 26*  < > 23* 24*  --   CREATININE 1.51* 1.49*  < > 1.10 1.20  --   CALCIUM 8.0* 8.2*  --   --   --   --   < > = values in this interval not displayed.  PT/INR:  Recent Labs   06/02/2017 1516  LABPROT 17.9*  INR 1.47   ABG    Component Value Date/Time   PHART 7.351 05-Aug-2017 1712   HCO3 22.0 05-Aug-2017 1712   TCO2 23 05-Aug-2017 1712   ACIDBASEDEF 3.0 (H) 05-Aug-2017 1712   O2SAT 97.0 05-Aug-2017 1712   CBG (last 3)   Recent Labs  06/02/2017 1609 06/02/2017 1709 06/02/2017 1807  GLUCAP 141* 153* 153*    Assessment/Plan: S/P Procedure(s) (LRB): CORONARY ARTERY BYPASS GRAFTING (CABG) x3 WITH TEE USING LEFT INTERNAL MAMMARY ARTERY AND RIGHT  SAPHENOUS LEG VEIN HARVESTED ENDOSCOPICALLY (N/A) milrinone for pulmonary HTN, OSA   LOS: 4 days    Philip Love 2017/03/01

## 2017-06-09 NOTE — Progress Notes (Signed)
The patient was examined and preop studies reviewed. There has been no change from the prior exam and the patient is ready for surgery.   Plan CABG on Philip Love

## 2017-06-09 NOTE — Progress Notes (Signed)
  Echocardiogram 2D Echocardiogram has been performed.  Arvil ChacoFoster, Manasvini Whatley 2017/04/26, 8:43 AM

## 2017-06-09 NOTE — Brief Op Note (Signed)
2017-01-19 - 05/13/2017  1:17 PM  PATIENT:  Philip Love  76 y.o. male  PRE-OPERATIVE DIAGNOSIS:  left main non stemi, CAD  POST-OPERATIVE DIAGNOSIS:  left main non stemi, CAD  PROCEDURE:  Procedure(s): CORONARY ARTERY BYPASS GRAFTING (CABG) x3 WITH TEE USING LEFT INTERNAL MAMMARY ARTERY AND RIGHT  SAPHENOUS LEG VEIN HARVESTED ENDOSCOPICALLY (N/A)  LIMA to LAD SVG to PDA SVG to OM1  SURGEON:  Surgeon(s) and Role:    Kerin Perna* Van Trigt, Peter, MD - Primary  PHYSICIAN ASSISTANT:  Jari Favreessa Quavon Keisling, PA-C    ANESTHESIA:   general  EBL:  Total I/O In: 1800 [I.V.:1800] Out: 975 [Urine:975]  BLOOD ADMINISTERED:none  DRAINS: ROUTINE   LOCAL MEDICATIONS USED:  NONE  SPECIMEN:  No Specimen  DISPOSITION OF SPECIMEN:  N/A  COUNTS:  YES  TOURNIQUET:  * No tourniquets in log *  DICTATION: .Dragon Dictation  PLAN OF CARE: Admit to inpatient   PATIENT DISPOSITION:  ICU - intubated and hemodynamically stable.   Delay start of Pharmacological VTE agent (>24hrs) due to surgical blood loss or risk of bleeding: yes

## 2017-06-10 ENCOUNTER — Encounter (HOSPITAL_COMMUNITY): Payer: Self-pay | Admitting: Cardiology

## 2017-06-10 ENCOUNTER — Inpatient Hospital Stay (HOSPITAL_COMMUNITY): Payer: Medicare Other

## 2017-06-10 LAB — BPAM FFP
BLOOD PRODUCT EXPIRATION DATE: 201808012359
BLOOD PRODUCT EXPIRATION DATE: 201808022359
ISSUE DATE / TIME: 201807291305
ISSUE DATE / TIME: 201807291305
UNIT TYPE AND RH: 6200
Unit Type and Rh: 6200

## 2017-06-10 LAB — GLUCOSE, CAPILLARY
GLUCOSE-CAPILLARY: 108 mg/dL — AB (ref 65–99)
GLUCOSE-CAPILLARY: 109 mg/dL — AB (ref 65–99)
GLUCOSE-CAPILLARY: 128 mg/dL — AB (ref 65–99)
GLUCOSE-CAPILLARY: 128 mg/dL — AB (ref 65–99)
GLUCOSE-CAPILLARY: 132 mg/dL — AB (ref 65–99)
GLUCOSE-CAPILLARY: 136 mg/dL — AB (ref 65–99)
GLUCOSE-CAPILLARY: 137 mg/dL — AB (ref 65–99)
GLUCOSE-CAPILLARY: 251 mg/dL — AB (ref 65–99)
GLUCOSE-CAPILLARY: 298 mg/dL — AB (ref 65–99)
Glucose-Capillary: 128 mg/dL — ABNORMAL HIGH (ref 65–99)
Glucose-Capillary: 114 mg/dL — ABNORMAL HIGH (ref 65–99)
Glucose-Capillary: 134 mg/dL — ABNORMAL HIGH (ref 65–99)
Glucose-Capillary: 130 mg/dL — ABNORMAL HIGH (ref 65–99)
Glucose-Capillary: 184 mg/dL — ABNORMAL HIGH (ref 65–99)

## 2017-06-10 LAB — CBC
HCT: 26.6 % — ABNORMAL LOW (ref 39.0–52.0)
HCT: 27.7 % — ABNORMAL LOW (ref 39.0–52.0)
HEMOGLOBIN: 9 g/dL — AB (ref 13.0–17.0)
HEMOGLOBIN: 9 g/dL — AB (ref 13.0–17.0)
MCH: 28.8 pg (ref 26.0–34.0)
MCH: 28.5 pg (ref 26.0–34.0)
MCHC: 33.8 g/dL (ref 30.0–36.0)
MCHC: 32.5 g/dL (ref 30.0–36.0)
MCV: 85 fL (ref 78.0–100.0)
MCV: 87.7 fL (ref 78.0–100.0)
PLATELETS: 193 10*3/uL (ref 150–400)
Platelets: 180 10*3/uL (ref 150–400)
RBC: 3.13 MIL/uL — ABNORMAL LOW (ref 4.22–5.81)
RBC: 3.16 MIL/uL — AB (ref 4.22–5.81)
RDW: 14.8 % (ref 11.5–15.5)
RDW: 15.8 % — ABNORMAL HIGH (ref 11.5–15.5)
WBC: 18.2 10*3/uL — ABNORMAL HIGH (ref 4.0–10.5)
WBC: 27.2 10*3/uL — AB (ref 4.0–10.5)

## 2017-06-10 LAB — BASIC METABOLIC PANEL
Anion gap: 7 (ref 5–15)
BUN: 22 mg/dL — AB (ref 6–20)
CHLORIDE: 104 mmol/L (ref 101–111)
CO2: 22 mmol/L (ref 22–32)
CREATININE: 1.78 mg/dL — AB (ref 0.61–1.24)
Calcium: 7.3 mg/dL — ABNORMAL LOW (ref 8.9–10.3)
GFR calc Af Amer: 42 mL/min — ABNORMAL LOW (ref >60)
GFR calc non Af Amer: 36 mL/min — ABNORMAL LOW (ref >60)
GLUCOSE: 108 mg/dL — AB (ref 65–99)
Potassium: 4.1 mmol/L (ref 3.5–5.1)
SODIUM: 133 mmol/L — AB (ref 135–145)

## 2017-06-10 LAB — POCT I-STAT 3, ART BLOOD GAS (G3+)
ACID-BASE DEFICIT: 1 mmol/L (ref 0.0–2.0)
ACID-BASE EXCESS: 2 mmol/L (ref 0.0–2.0)
BICARBONATE: 26 mmol/L (ref 20.0–28.0)
BICARBONATE: 25.3 mmol/L (ref 20.0–28.0)
Bicarbonate: 22.9 mmol/L (ref 20.0–28.0)
O2 SAT: 96 %
O2 Saturation: 98 %
O2 Saturation: 92 %
PH ART: 7.45 (ref 7.350–7.450)
PH ART: 7.401 (ref 7.350–7.450)
PH ART: 7.369 (ref 7.350–7.450)
PO2 ART: 83 mmHg (ref 83.0–108.0)
Patient temperature: 37.9
TCO2: 27 mmol/L (ref 0–100)
TCO2: 24 mmol/L (ref 0–100)
TCO2: 27 mmol/L (ref 0–100)
pCO2 arterial: 37.4 mmHg (ref 32.0–48.0)
pCO2 arterial: 37.2 mmHg (ref 32.0–48.0)
pCO2 arterial: 44.2 mmHg (ref 32.0–48.0)
pO2, Arterial: 99 mmHg (ref 83.0–108.0)
pO2, Arterial: 69 mmHg — ABNORMAL LOW (ref 83.0–108.0)

## 2017-06-10 LAB — POCT I-STAT, CHEM 8
BUN: 24 mg/dL — ABNORMAL HIGH (ref 6–20)
CALCIUM ION: 1.06 mmol/L — AB (ref 1.15–1.40)
CHLORIDE: 100 mmol/L — AB (ref 101–111)
Creatinine, Ser: 2.3 mg/dL — ABNORMAL HIGH (ref 0.61–1.24)
Glucose, Bld: 203 mg/dL — ABNORMAL HIGH (ref 65–99)
HEMATOCRIT: 28 % — AB (ref 39.0–52.0)
Hemoglobin: 9.5 g/dL — ABNORMAL LOW (ref 13.0–17.0)
Potassium: 4.8 mmol/L (ref 3.5–5.1)
Sodium: 133 mmol/L — ABNORMAL LOW (ref 135–145)
TCO2: 19 mmol/L (ref 0–100)

## 2017-06-10 LAB — MAGNESIUM
MAGNESIUM: 2.5 mg/dL — AB (ref 1.7–2.4)
Magnesium: 2.7 mg/dL — ABNORMAL HIGH (ref 1.7–2.4)

## 2017-06-10 LAB — CREATININE, SERUM
Creatinine, Ser: 2.3 mg/dL — ABNORMAL HIGH (ref 0.61–1.24)
GFR calc non Af Amer: 27 mL/min — ABNORMAL LOW (ref >60)
GFR, EST AFRICAN AMERICAN: 31 mL/min — AB (ref >60)

## 2017-06-10 LAB — PREPARE FRESH FROZEN PLASMA
UNIT DIVISION: 0
Unit division: 0

## 2017-06-10 MED ORDER — INSULIN DETEMIR 100 UNIT/ML ~~LOC~~ SOLN: 18.0000 [IU] | Freq: Two times a day (BID) | SUBCUTANEOUS | Status: DC

## 2017-06-10 MED ORDER — ALBUMIN HUMAN 5 % IV SOLN
INTRAVENOUS | Status: AC
  Administered 2017-06-10: 12.5 g
  Filled 2017-06-10: qty 250

## 2017-06-10 MED ORDER — AMIODARONE HCL IN DEXTROSE 360-4.14 MG/200ML-% IV SOLN
60.0000 mg/h | INTRAVENOUS | Status: AC
  Administered 2017-06-10: 60 mg/h via INTRAVENOUS
  Filled 2017-06-10: qty 400

## 2017-06-10 MED ORDER — MORPHINE SULFATE (PF) 4 MG/ML IV SOLN: 1.0000 mg | INTRAVENOUS | Status: AC | PRN

## 2017-06-10 MED ORDER — AMIODARONE HCL IN DEXTROSE 360-4.14 MG/200ML-% IV SOLN
30.0000 mg/h | INTRAVENOUS | Status: DC
Start: 2017-06-11 — End: 2017-06-11
  Administered 2017-06-11: 30 mg/h via INTRAVENOUS
  Filled 2017-06-10: qty 200

## 2017-06-10 MED ORDER — INSULIN DETEMIR 100 UNIT/ML ~~LOC~~ SOLN
18.0000 [IU] | Freq: Two times a day (BID) | SUBCUTANEOUS | Status: DC
  Administered 2017-06-10 (×2): 18 [IU] via SUBCUTANEOUS
  Filled 2017-06-10 (×3): qty 0.18

## 2017-06-10 MED ORDER — AMIODARONE LOAD VIA INFUSION
150.0000 mg | Freq: Once | INTRAVENOUS | Status: AC
  Administered 2017-06-10: 150 mg via INTRAVENOUS
  Filled 2017-06-10: qty 83.34

## 2017-06-10 MED ORDER — INSULIN ASPART 100 UNIT/ML ~~LOC~~ SOLN
0.0000 [IU] | SUBCUTANEOUS | Status: DC
  Administered 2017-06-10: 4 [IU] via SUBCUTANEOUS
  Administered 2017-06-10 (×2): 12 [IU] via SUBCUTANEOUS
  Administered 2017-06-10: 2 [IU] via SUBCUTANEOUS
  Administered 2017-06-11: 16 [IU] via SUBCUTANEOUS

## 2017-06-10 MED ORDER — DEXTROSE 5 % IV SOLN
0.0000 ug/min | INTRAVENOUS | Status: DC
  Administered 2017-06-10: 10 ug/min via INTRAVENOUS
  Administered 2017-06-10 – 2017-06-11 (×3): 25 ug/min via INTRAVENOUS
  Administered 2017-06-12: 28 ug/min via INTRAVENOUS
  Administered 2017-06-12: 25 ug/min via INTRAVENOUS
  Administered 2017-06-13: 30 ug/min via INTRAVENOUS
  Administered 2017-06-13: 28 ug/min via INTRAVENOUS
  Administered 2017-06-14: 30 ug/min via INTRAVENOUS
  Administered 2017-06-14: 28 ug/min via INTRAVENOUS
  Administered 2017-06-15 (×2): 30 ug/min via INTRAVENOUS
  Administered 2017-06-16: 26 ug/min via INTRAVENOUS
  Administered 2017-06-16: 27 ug/min via INTRAVENOUS
  Administered 2017-06-17: 18 ug/min via INTRAVENOUS
  Administered 2017-06-17: 22 ug/min via INTRAVENOUS
  Administered 2017-06-18 – 2017-06-19 (×2): 16 ug/min via INTRAVENOUS
  Administered 2017-06-20: 15 ug/min via INTRAVENOUS
  Administered 2017-06-20: 17 ug/min via INTRAVENOUS
  Administered 2017-06-21 – 2017-06-22 (×3): 20 ug/min via INTRAVENOUS
  Administered 2017-06-22: 18 ug/min via INTRAVENOUS
  Administered 2017-06-23: 26 ug/min via INTRAVENOUS
  Administered 2017-06-23: 24 ug/min via INTRAVENOUS
  Administered 2017-06-24 (×2): 30 ug/min via INTRAVENOUS
  Administered 2017-06-25 (×2): 50 ug/min via INTRAVENOUS
  Filled 2017-06-10 (×30): qty 16

## 2017-06-10 MED ORDER — FUROSEMIDE 10 MG/ML IJ SOLN
40.0000 mg | Freq: Two times a day (BID) | INTRAMUSCULAR | Status: DC
  Administered 2017-06-10 (×2): 40 mg via INTRAVENOUS
  Filled 2017-06-10 (×2): qty 4

## 2017-06-10 MED FILL — Sodium Bicarbonate IV Soln 8.4%: INTRAVENOUS | Qty: 50 | Status: AC

## 2017-06-10 MED FILL — Mannitol IV Soln 20%: INTRAVENOUS | Qty: 500 | Status: AC

## 2017-06-10 MED FILL — Electrolyte-R (PH 7.4) Solution: INTRAVENOUS | Qty: 4000 | Status: AC

## 2017-06-10 MED FILL — Sodium Chloride IV Soln 0.9%: INTRAVENOUS | Qty: 2000 | Status: AC

## 2017-06-10 MED FILL — Potassium Chloride Inj 2 mEq/ML: INTRAVENOUS | Qty: 20 | Status: AC

## 2017-06-10 MED FILL — Heparin Sodium (Porcine) Inj 1000 Unit/ML: INTRAMUSCULAR | Qty: 10 | Status: AC

## 2017-06-10 MED FILL — Heparin Sodium (Porcine) Inj 1000 Unit/ML: INTRAMUSCULAR | Qty: 2500 | Status: AC

## 2017-06-10 MED FILL — Heparin Sodium (Porcine) Inj 1000 Unit/ML: INTRAMUSCULAR | Qty: 30 | Status: AC

## 2017-06-10 MED FILL — Magnesium Sulfate Inj 50%: INTRAMUSCULAR | Qty: 20 | Status: AC

## 2017-06-10 MED FILL — Lidocaine HCl IV Inj 20 MG/ML: INTRAVENOUS | Qty: 10 | Status: AC

## 2017-06-10 NOTE — Progress Notes (Signed)
1 Day Post-Op Procedure(s) (LRB): CORONARY ARTERY BYPASS GRAFTING (CABG) x3 WITH TEE USING LEFT INTERNAL MAMMARY ARTERY AND RIGHT  SAPHENOUS LEG VEIN HARVESTED ENDOSCOPICALLY (N/A) Subjective: Status post urgent CABG 3 for left main stenosis, RCA stenosis Preoperative sleep apnea, pulmonary hypertension, and chronic renal disease Postop delayed extubation but successful, postoperative rising creatinine to 2.3, Postop atrial fibrillation now on IV amiodarone Objective: Vital signs in last 24 hours: Temp:  [98.4 F (36.9 C)-100.2 F (37.9 C)] 98.8 F (37.1 C) (07/30 1815) Pulse Rate:  [73-126] 117 (07/30 1815) Cardiac Rhythm: Atrial paced (07/30 1200) Resp:  [0-30] 23 (07/30 1815) BP: (112-153)/(37-112) 112/50 (07/30 1800) SpO2:  [86 %-100 %] 96 % (07/30 1815) Arterial Line BP: (76-147)/(38-59) 111/41 (07/30 1815) FiO2 (%):  [35 %-50 %] 35 % (07/30 1200) Weight:  [265 lb 6.9 oz (120.4 kg)] 265 lb 6.9 oz (120.4 kg) (07/30 0500)  Hemodynamic parameters for last 24 hours: PAP: (32-50)/(12-27) 40/23 CO:  [5.3 L/min-7.7 L/min] 7.7 L/min CI:  [2.4 L/min/m2-3.4 L/min/m2] 3.4 L/min/m2  Intake/Output from previous day: 07/29 0701 - 07/30 0700 In: 7879.5 [I.V.:6139.5; Blood:940; IV Piggyback:800] Out: 3175 [Urine:1525; Blood:1200; Chest Tube:450] Intake/Output this shift: Total I/O In: 1182.6 [I.V.:932.6; IV Piggyback:250] Out: 608 [Urine:318; Chest Tube:290]       Exam    General- alert and comfortable   Lungs- clear without rales, wheezes   Cor- regular rate and rhythm, no murmur , gallop   Abdomen- soft, non-tender   Extremities - warm, non-tender, minimal edema   Neuro- oriented, appropriate, no focal weakness   Lab Results:  Recent Labs  06/10/17 0602 06/10/17 1615 06/10/17 1626  WBC 18.2* 27.2*  --   HGB 9.0* 9.0* 9.5*  HCT 26.6* 27.7* 28.0*  PLT 180 193  --    BMET:  Recent Labs  01/31/2017 0600  06/10/17 0602 06/10/17 1615 06/10/17 1626  NA 133*  < > 133*   --  133*  K 4.2  < > 4.1  --  4.8  CL 100*  < > 104  --  100*  CO2 26  --  22  --   --   GLUCOSE 98  < > 108*  --  203*  BUN 26*  < > 22*  --  24*  CREATININE 1.49*  < > 1.78* 2.30* 2.30*  CALCIUM 8.2*  --  7.3*  --   --   < > = values in this interval not displayed.  PT/INR:  Recent Labs  01/31/2017 1516  LABPROT 17.9*  INR 1.47   ABG    Component Value Date/Time   PHART 7.369 06/10/2017 1020   HCO3 25.3 06/10/2017 1020   TCO2 19 06/10/2017 1626   ACIDBASEDEF 1.0 06/10/2017 0854   O2SAT 92.0 06/10/2017 1020   CBG (last 3)   Recent Labs  06/10/17 1102 06/10/17 1211 06/10/17 1555  GLUCAP 134* 130* 184*    Assessment/Plan: S/P Procedure(s) (LRB): CORONARY ARTERY BYPASS GRAFTING (CABG) x3 WITH TEE USING LEFT INTERNAL MAMMARY ARTERY AND RIGHT  SAPHENOUS LEG VEIN HARVESTED ENDOSCOPICALLY (N/A) Mobilize Diuresis Diabetes control See progression orders   LOS: 5 days    Philip Love 06/10/2017

## 2017-06-10 NOTE — Op Note (Signed)
NAMMancel Love:  Philip Love, Philip Love              ACCOUNT NO.:  [id number]  MEDICAL RECORD NO.:  [id number]  LOCATION:  [id number]                        FACILITY:  [facility]  PHYSICIAN:  Kerin PernaPeter Van Trigt, M.D.  DATE OF BIRTH:  02/08/1941  DATE OF PROCEDURE:  06/11/2017 DATE OF DISCHARGE:                              OPERATIVE REPORT   OPERATIONS: 1. Coronary artery bypass grafting x3 (left internal mammary artery to     left anterior descending artery, saphenous vein graft to circumflex     marginal, saphenous vein graft to posterior descending). 2. Endoscopic harvest of right leg greater saphenous vein, exposure,     but not harvest of left leg greater saphenous vein due to     inadequate size and quality.  SURGEON:  Kerin PernaPeter Van Trigt, M.D.  ASSISTANT:  Jari Favreessa Conte, PA-C  ANESTHESIA:  General.  INDICATIONS:  The patient is a morbidly obese 76 year old diabetic, who presented with chest pain, shortness of breath, and was transferred to this hospital for cardiac catheterization.  This demonstrated severe left main and severe proximal RCA stenosis.  Ejection fraction was 40- 45%.  There was trace mitral regurgitation by echo.  The patient was placed on IV heparin and nitroglycerin.  His cardiologist felt that surgical coronary revascularization would be his best long-term therapy of his coronary artery disease.  I saw the patient in consultation after reviewing his cardiac cath and echocardiogram and agreed with that recommendation.  I discussed the procedure of multivessel CABG with the patient and his family including the indications, benefits, alternatives and risks.  I discussed the details of the surgery including the use of general anesthesia and cardiopulmonary bypass, the location of the surgical incisions, and the expected postoperative hospital recovery.  I discussed the risks of the surgery to the patient including the risks of stroke, bleeding, blood transfusion requirement, postoperative  long problems including pleural effusion, postoperative infection, and death. After we reviewed these issues, he demonstrated his understanding and agreed to proceed with surgery under what I felt was an informed consent.  OPERATIVE FINDINGS: 1. Difficult exposure due to the patient's short, obese body habitus. 2. The coronaries were diffusely diseased and intramyocardial and redo     surgery would be advised. 3. No packed cell transfusion required for this surgery. 4. Adequate urine output during the operation with preoperative     elevated creatinine of 1.7-1.8.  OPERATIVE PROCEDURE:  The patient was brought to the operating room and placed supine on the operating table where general anesthesia was induced under invasive hemodynamic monitoring.  A transesophageal echo probe was placed by the Anesthesia team.  The chest, abdomen and legs were prepped and draped as a sterile field.  A proper time-out was performed.  A sternal incision was made as the saphenous vein was harvested endoscopically from the right leg.  The left leg vein was inadequate for use.  The left internal mammary artery was a 1.5-mm vessel with good flow.  It was difficult to expose and harvest due to the short, obese body habitus.  The sternal retractor was placed using all of the blades because of his obesity.  The pericardium was opened and suspended.  Pursestrings were placed  in the ascending aorta and right atrium.  Heparin had been administered and when the ACT was documented as being therapeutic, the patient was cannulated and placed on the bypass.  The coronaries were identified for grafting.  The right coronary was heavily calcified up to the posterior descending.  The LAD was heavily calcified proximally. The circumflex was intramyocardial and had proximal calcification.  All the main vessels had at least 90% stenosis.  The mammary artery and vein grafts were prepared for the distal anastomoses, and  cardioplegia cannulas were placed for both antegrade and retrograde cold blood cardioplegia.  The patient was cooled to 32 degrees, and the aortic crossclamp was applied.  One liter of cold blood cardioplegia was delivered in split doses between the antegrade aortic and retrograde coronary sinus catheters.  There was good cardioplegic arrest and septal temperature dropped less than 14 degrees.  Cardioplegia was delivered every 20 minutes.  The distal coronary anastomoses were performed.  The first distal anastomosis was to the posterior descending.  This was 1.5-mm vessel with proximal 90% stenosis.  A reverse saphenous vein was sewn end-to- side with running 7-0 Prolene with good flow through the graft. Cardioplegia was redosed.  The second distal anastomosis was to the OM branch of the left coronary. This was a 1.5-mm vessel, deeply intramyocardial.  He had a proximal left main 95% stenosis.  A reverse saphenous vein was sewn end-to-side with running 7-0 Prolene with good flow through the graft.  Cardioplegia was redosed.  The third distal anastomosis was to the LAD.  This had a proximal 95% stenosis of the left main.  There was also proximal LAD stenosis.  The LAD was 1.4-mm vessel.  The left IMA pedicle was brought through an opening and the left lateral pericardium was brought down onto the LAD and sewn end-to-side with running 8-0 Prolene.  The mammary artery was very thin and fragile, and extra interrupted sutures were placed to make the anastomosis hemostatic.  After releasing the bulldog on the mammary pedicle, there was evidence of good flow through the graft and bulldog was reapplied.  The pedicle was secured to epicardium with 6-0 prolene. Cardioplegia was redosed.  While the crossclamp was still in place, two proximal vein anastomoses were performed around the ascending aorta using a 4.5-mm punch and running 6-0 Prolene.  Prior to tying down the final  proximal anastomosis, air was vented from the coronaries with a dose of retrograde warm blood cardioplegia.  The crossclamp was then removed.  The vein grafts were de-aired and opened.  The heart resumed a spontaneous rhythm.  The proximal and distal anastomoses were checked and were hemostatic.  The patient was rewarmed.  Temporary pacing wires were applied.  The lungs were expanded.  The ventilator was resumed. The patient was weaned from cardiopulmonary bypass on low-dose dopamine as well as milrinone, which was started for his preoperative high PA pressures of 55/30.  The patient came up bypass without difficulty.  Cardiac output was 4-5 liters/minute and blood pressure was stable.  Protamine was administered without adverse reaction.  The cannulas were removed.  The mediastinum was irrigated with warm saline.  The superior pericardial fat was closed over the aorta.  Anterior mediastinal and left pleural tubes were placed and brought out through separate incisions.  The patient remained stable.  The sternum was closed with interrupted wire.  There was a crack in the left side of the sternum below the manubrium and this was repaired with some horizontal  sternal wires and then the regular complement of transverse sternal wires, which closed the sternum nicely.  The pectoralis fascia was then closed using running #1 Vicryl. The subcutaneous layer was closed using running 2-0 Vicryl.  The skin was closed with a subcuticular suture.  Sterile dressings were placed and the patient was prepared for transfer back to ICU.  Total cardiopulmonary bypass time was 120 minutes.     Kerin Perna, M.D.     PV/MEDQ  D:  05/12/2017  T:  06/10/2017  Job:  [id number]

## 2017-06-10 NOTE — Progress Notes (Signed)
      [address]       [place] [id number]             [phone]      S/p CABG x 3  Extubated on venti mask  BP (!) 153/46 Comment: recheck  Pulse 95   Temp 99.1 F (37.3 C)   Resp (!) 23   Ht 5\' 8"  (1.727 m)   Wt 265 lb 6.9 oz (120.4 kg)   SpO2 93%   BMI 40.36 kg/m   37/20 CO= 7.7  Intake/Output Summary (Last 24 hours) at 06/10/17 1802 Last data filed at 06/10/17 1645  Gross per 24 hour  Intake          3009.22 ml  Output             1188 ml  Net          1821.22 ml   Creatinine 2.30, K= 4.8 Hct= 28  Went into atrial fib with rapid RVR- amiodarone load in progress  Viviann SpareSteven C. Dorris FetchHendrickson, MD [facility] [phone]

## 2017-06-10 NOTE — Care Management Note (Addendum)
Case Management Note  Patient Details  Name: Philip DutchRobert Bochicchio MRN: [id number] Date of Birth: May 21, 1941  Subjective/Objective:   PTA indep, from home with wife, POD 1 CABG, per wife, he has insurance, and a PCP.  Wife states she will be with patient 24/7.   7/31 1716 Letha Capeeborah Gareth Fitzner RN, BSN- POD 1 CABG , post op afib and venturi mask, conts on precedex, dopamine, amio.  Was re intubated. Discussed in LOS 7/31, appropriate for continued stay.  8/1 1432 Letha Capeeborah Izaiyah Kleinman RN, BSN - self extubated today, conts on  vmask 40%, dopamine, levophed,amio, milrinone, and insulin drip.   8/2 1222 Letha Capeeborah  Monroe Toure RN, BSN - patient became agitated, pulling off BiPAP mask desatted to with increase WOB, emergent re intubated today  per PCCM, remains on milrinone, levo, and amio.     8/7 Letha Capeeborah Vallery Mcdade RN, BSN - POD 8 CABG, acute /chronic renal failure,conts on vent and crrt, milrinone , amio, and levophed, for extubation this week.  Discussed in LOS , appropriate for continued stay.  8/10 1245 Letha Capeeborah Zaida Reiland RN, BSN- Conts on vent,unresponsive, for repeat head ct, afib, low CVP, low Co-ox, conts on CRRT, wbc rising, having diarrhea.     8/13 1002 Letha Capeeborah Terecia Plaut RN, BSN - conts on vent, conts on CRRT, CVP 8-10, POD 14 CABG, daily SBT, for MRI when stable, conts on amio, norepi, milrinone, hep  And vasopressin.               Action/Plan: NCM will follow for dc needs.   Expected Discharge Date:                  Expected Discharge Plan:     In-House Referral:     Discharge planning Services  CM Consult  Post Acute Care Choice:    Choice offered to:     DME Arranged:    DME Agency:     HH Arranged:    HH Agency:     Status of Service:  In process, will continue to follow  If discussed at Long Length of Stay Meetings, dates discussed:    Additional Comments:  Leone Havenaylor, Dejia Ebron Clinton, RN 06/10/2017, 3:27 PM

## 2017-06-10 NOTE — Procedures (Signed)
Extubation Procedure Note  Patient Details:   Name: Philip DutchRobert Arndt DOB: 22-Apr-1941 MRN: [id number]   Airway Documentation:  Airway 8 mm (Active)  Secured at (cm) 23 cm 06/10/2017  8:08 AM  Measured From Lips 06/10/2017  8:08 AM  Secured Location Center 06/10/2017  8:08 AM  Secured By Caron PresumePink Tape 06/10/2017  8:08 AM  Site Condition Dry 06/10/2017  3:12 AM    Evaluation  O2 sats: stable throughout and currently acceptable Complications: No apparent complications Patient did tolerate procedure well. Bilateral Breath Sounds: Clear, Diminished   Yes   NIF -31, VC 700  Antoine Pocherogdon, Lynze Reddy Caroline 06/10/2017, 9:22 AM

## 2017-06-11 ENCOUNTER — Encounter (HOSPITAL_COMMUNITY): Payer: Medicare Other

## 2017-06-11 ENCOUNTER — Inpatient Hospital Stay (HOSPITAL_COMMUNITY): Payer: Medicare Other

## 2017-06-11 DIAGNOSIS — J9601 Acute respiratory failure with hypoxia: Secondary | ICD-10-CM

## 2017-06-11 DIAGNOSIS — J9600 Acute respiratory failure, unspecified whether with hypoxia or hypercapnia: Secondary | ICD-10-CM

## 2017-06-11 DIAGNOSIS — I208 Other forms of angina pectoris: Secondary | ICD-10-CM

## 2017-06-11 DIAGNOSIS — J9602 Acute respiratory failure with hypercapnia: Secondary | ICD-10-CM

## 2017-06-11 LAB — POCT I-STAT 3, ART BLOOD GAS (G3+)
ACID-BASE DEFICIT: 6 mmol/L — AB (ref 0.0–2.0)
ACID-BASE DEFICIT: 5 mmol/L — AB (ref 0.0–2.0)
Acid-base deficit: 7 mmol/L — ABNORMAL HIGH (ref 0.0–2.0)
Acid-base deficit: 5 mmol/L — ABNORMAL HIGH (ref 0.0–2.0)
BICARBONATE: 21.1 mmol/L (ref 20.0–28.0)
BICARBONATE: 20.3 mmol/L (ref 20.0–28.0)
Bicarbonate: 19.6 mmol/L — ABNORMAL LOW (ref 20.0–28.0)
Bicarbonate: 19.7 mmol/L — ABNORMAL LOW (ref 20.0–28.0)
O2 Saturation: 91 %
O2 Saturation: 100 %
O2 Saturation: 98 %
O2 Saturation: 95 %
PCO2 ART: 44.8 mmHg (ref 32.0–48.0)
PCO2 ART: 41.5 mmHg (ref 32.0–48.0)
PO2 ART: 390 mmHg — AB (ref 83.0–108.0)
Patient temperature: 98.6
Patient temperature: 98.6
TCO2: 21 mmol/L (ref 0–100)
TCO2: 22 mmol/L (ref 0–100)
TCO2: 21 mmol/L (ref 0–100)
TCO2: 21 mmol/L (ref 0–100)
pCO2 arterial: 37.2 mmHg (ref 32.0–48.0)
pCO2 arterial: 37.1 mmHg (ref 32.0–48.0)
pH, Arterial: 7.249 — ABNORMAL LOW (ref 7.350–7.450)
pH, Arterial: 7.315 — ABNORMAL LOW (ref 7.350–7.450)
pH, Arterial: 7.332 — ABNORMAL LOW (ref 7.350–7.450)
pH, Arterial: 7.347 — ABNORMAL LOW (ref 7.350–7.450)
pO2, Arterial: 72 mmHg — ABNORMAL LOW (ref 83.0–108.0)
pO2, Arterial: 122 mmHg — ABNORMAL HIGH (ref 83.0–108.0)
pO2, Arterial: 81 mmHg — ABNORMAL LOW (ref 83.0–108.0)

## 2017-06-11 LAB — CBC
HCT: 26 % — ABNORMAL LOW (ref 39.0–52.0)
Hemoglobin: 8.5 g/dL — ABNORMAL LOW (ref 13.0–17.0)
MCH: 28.6 pg (ref 26.0–34.0)
MCHC: 32.7 g/dL (ref 30.0–36.0)
MCV: 87.5 fL (ref 78.0–100.0)
Platelets: 191 10*3/uL (ref 150–400)
RBC: 2.97 MIL/uL — ABNORMAL LOW (ref 4.22–5.81)
RDW: 15.5 % (ref 11.5–15.5)
WBC: 30.3 10*3/uL — ABNORMAL HIGH (ref 4.0–10.5)

## 2017-06-11 LAB — GLUCOSE, CAPILLARY
GLUCOSE-CAPILLARY: 335 mg/dL — AB (ref 65–99)
GLUCOSE-CAPILLARY: 317 mg/dL — AB (ref 65–99)
GLUCOSE-CAPILLARY: 361 mg/dL — AB (ref 65–99)
GLUCOSE-CAPILLARY: 332 mg/dL — AB (ref 65–99)
GLUCOSE-CAPILLARY: 289 mg/dL — AB (ref 65–99)
GLUCOSE-CAPILLARY: 261 mg/dL — AB (ref 65–99)
GLUCOSE-CAPILLARY: 227 mg/dL — AB (ref 65–99)
Glucose-Capillary: 340 mg/dL — ABNORMAL HIGH (ref 65–99)
Glucose-Capillary: 351 mg/dL — ABNORMAL HIGH (ref 65–99)
Glucose-Capillary: 348 mg/dL — ABNORMAL HIGH (ref 65–99)
Glucose-Capillary: 284 mg/dL — ABNORMAL HIGH (ref 65–99)
Glucose-Capillary: 218 mg/dL — ABNORMAL HIGH (ref 65–99)
Glucose-Capillary: 201 mg/dL — ABNORMAL HIGH (ref 65–99)
Glucose-Capillary: 189 mg/dL — ABNORMAL HIGH (ref 65–99)

## 2017-06-11 LAB — BASIC METABOLIC PANEL
Anion gap: 10 (ref 5–15)
BUN: 29 mg/dL — ABNORMAL HIGH (ref 6–20)
CO2: 19 mmol/L — ABNORMAL LOW (ref 22–32)
Calcium: 7.4 mg/dL — ABNORMAL LOW (ref 8.9–10.3)
Chloride: 99 mmol/L — ABNORMAL LOW (ref 101–111)
Creatinine, Ser: 3.06 mg/dL — ABNORMAL HIGH (ref 0.61–1.24)
GFR calc Af Amer: 22 mL/min — ABNORMAL LOW (ref >60)
GFR calc non Af Amer: 19 mL/min — ABNORMAL LOW (ref >60)
Glucose, Bld: 329 mg/dL — ABNORMAL HIGH (ref 65–99)
Potassium: 4.8 mmol/L (ref 3.5–5.1)
Sodium: 128 mmol/L — ABNORMAL LOW (ref 135–145)

## 2017-06-11 LAB — COOXEMETRY PANEL
CARBOXYHEMOGLOBIN: 0.9 % (ref 0.5–1.5)
Carboxyhemoglobin: 1.3 % (ref 0.5–1.5)
METHEMOGLOBIN: 1.3 % (ref 0.0–1.5)
Methemoglobin: 0.9 % (ref 0.0–1.5)
O2 SAT: 58.7 %
O2 Saturation: 59.8 %
TOTAL HEMOGLOBIN: 9.6 g/dL — AB (ref 12.0–16.0)
TOTAL HEMOGLOBIN: 8.4 g/dL — AB (ref 12.0–16.0)

## 2017-06-11 LAB — POCT I-STAT, CHEM 8
BUN: 31 mg/dL — ABNORMAL HIGH (ref 6–20)
Calcium, Ion: 1.04 mmol/L — ABNORMAL LOW (ref 1.15–1.40)
Chloride: 97 mmol/L — ABNORMAL LOW (ref 101–111)
Creatinine, Ser: 3.9 mg/dL — ABNORMAL HIGH (ref 0.61–1.24)
GLUCOSE: 242 mg/dL — AB (ref 65–99)
HCT: 27 % — ABNORMAL LOW (ref 39.0–52.0)
HEMOGLOBIN: 9.2 g/dL — AB (ref 13.0–17.0)
POTASSIUM: 4 mmol/L (ref 3.5–5.1)
SODIUM: 130 mmol/L — AB (ref 135–145)
TCO2: 19 mmol/L (ref 0–100)

## 2017-06-11 MED ORDER — FUROSEMIDE 10 MG/ML IJ SOLN
80.0000 mg | Freq: Two times a day (BID) | INTRAMUSCULAR | Status: DC
  Administered 2017-06-11: 80 mg via INTRAVENOUS
  Filled 2017-06-11: qty 8

## 2017-06-11 MED ORDER — MIDAZOLAM HCL 2 MG/2ML IJ SOLN
1.0000 mg | INTRAMUSCULAR | Status: DC | PRN
  Administered 2017-06-11 – 2017-06-12 (×3): 2 mg via INTRAVENOUS
  Filled 2017-06-11 (×4): qty 2

## 2017-06-11 MED ORDER — FUROSEMIDE 10 MG/ML IJ SOLN
160.0000 mg | Freq: Two times a day (BID) | INTRAVENOUS | Status: DC
  Administered 2017-06-11 – 2017-06-12 (×2): 160 mg via INTRAVENOUS
  Filled 2017-06-11 (×2): qty 16

## 2017-06-11 MED ORDER — AMIODARONE HCL IN DEXTROSE 360-4.14 MG/200ML-% IV SOLN
30.0000 mg/h | INTRAVENOUS | Status: DC
  Administered 2017-06-11 – 2017-06-14 (×8): 30 mg/h via INTRAVENOUS
  Filled 2017-06-11 (×8): qty 200

## 2017-06-11 MED ORDER — AMIODARONE IV BOLUS ONLY 150 MG/100ML
150.0000 mg | Freq: Once | INTRAVENOUS | Status: AC
  Administered 2017-06-11: 150 mg via INTRAVENOUS

## 2017-06-11 MED ORDER — DEXTROSE 5 % IV SOLN
2.0000 g | INTRAVENOUS | Status: DC
  Administered 2017-06-11 – 2017-06-13 (×3): 2 g via INTRAVENOUS
  Filled 2017-06-11 (×3): qty 2

## 2017-06-11 MED ORDER — DEXTROSE 5 % IV SOLN: 1.0000 g | Freq: Three times a day (TID) | INTRAVENOUS | Status: DC

## 2017-06-11 MED ORDER — SODIUM BICARBONATE 8.4 % IV SOLN
50.0000 meq | Freq: Once | INTRAVENOUS | Status: AC
  Administered 2017-06-11: 50 meq via INTRAVENOUS
  Filled 2017-06-11: qty 50

## 2017-06-11 MED ORDER — SODIUM CHLORIDE 0.9 % IV SOLN
INTRAVENOUS | Status: DC
  Administered 2017-06-11 (×2): 5.6 [IU]/h via INTRAVENOUS
  Administered 2017-06-11: 16.2 [IU]/h via INTRAVENOUS
  Administered 2017-06-12: 2.7 [IU]/h via INTRAVENOUS
  Administered 2017-06-13: 3.7 [IU]/h via INTRAVENOUS
  Filled 2017-06-11 (×5): qty 1

## 2017-06-11 MED ORDER — INSULIN REGULAR BOLUS VIA INFUSION
0.0000 [IU] | Freq: Three times a day (TID) | INTRAVENOUS | Status: DC
Start: 2017-06-11 — End: 2017-06-14
  Filled 2017-06-11: qty 10

## 2017-06-11 MED ORDER — DEXTROSE 50 % IV SOLN: 25.0000 mL | INTRAVENOUS | Status: DC | PRN

## 2017-06-11 MED ORDER — BISACODYL 10 MG RE SUPP: 10.0000 mg | Freq: Every day | RECTAL | Status: DC | PRN

## 2017-06-11 MED ORDER — FENTANYL CITRATE (PF) 100 MCG/2ML IJ SOLN
50.0000 ug | INTRAMUSCULAR | Status: DC | PRN
  Administered 2017-06-11 – 2017-06-14 (×4): 50 ug via INTRAVENOUS
  Filled 2017-06-11 (×4): qty 2

## 2017-06-11 MED ORDER — FENTANYL CITRATE (PF) 100 MCG/2ML IJ SOLN
INTRAMUSCULAR | Status: AC
  Filled 2017-06-11: qty 2

## 2017-06-11 MED ORDER — PANTOPRAZOLE SODIUM 40 MG IV SOLR
40.0000 mg | Freq: Every day | INTRAVENOUS | Status: DC
  Administered 2017-06-11 – 2017-06-15 (×5): 40 mg via INTRAVENOUS
  Filled 2017-06-11 (×5): qty 40

## 2017-06-11 MED ORDER — ETOMIDATE 2 MG/ML IV SOLN
20.0000 mg | Freq: Once | INTRAVENOUS | Status: AC
  Administered 2017-06-11: 20 mg via INTRAVENOUS

## 2017-06-11 MED ORDER — DOCUSATE SODIUM 50 MG/5ML PO LIQD
100.0000 mg | Freq: Two times a day (BID) | ORAL | Status: DC | PRN
  Administered 2017-06-14 – 2017-06-15 (×2): 100 mg
  Filled 2017-06-11 (×2): qty 10

## 2017-06-11 MED ORDER — SODIUM CHLORIDE 0.9 % IV SOLN: INTRAVENOUS | Status: DC

## 2017-06-11 MED ORDER — AMIODARONE HCL IN DEXTROSE 360-4.14 MG/200ML-% IV SOLN: 60.0000 mg/h | INTRAVENOUS | Status: DC

## 2017-06-11 MED ORDER — MIDAZOLAM HCL 2 MG/2ML IJ SOLN
INTRAMUSCULAR | Status: AC
  Filled 2017-06-11: qty 4

## 2017-06-11 MED ORDER — FENTANYL CITRATE (PF) 100 MCG/2ML IJ SOLN: 25.0000 ug | INTRAMUSCULAR | Status: DC | PRN

## 2017-06-11 MED ORDER — INSULIN DETEMIR 100 UNIT/ML ~~LOC~~ SOLN
25.0000 [IU] | Freq: Two times a day (BID) | SUBCUTANEOUS | Status: DC
  Filled 2017-06-11: qty 0.25

## 2017-06-11 NOTE — Progress Notes (Signed)
Patient a. Line SBP <120 at the beginning of shift. Per day shift RN, patient needed SBP >120 for kidney perfusion.   Called MD on call to notify of BP, and no new orders were received to increase pressures.  Unknown Schleyer E Mylinda LatinaNino Combs, [place]

## 2017-06-11 NOTE — Progress Notes (Signed)
Patient became restless and agitated and tried to self extubate and continued reaching for the tube while RN was assisting another patient.   Three other RNs at bedside held patient so that he would not pull his breathing tube or any lines.   Patient remained safe and no lines or tubes were pulled.   Wrist restraints were placed on the patient for safety purposes and sedation was given.   Will continue to monitor patient.   Arma Reining E Mylinda LatinaNino Combs, [place]

## 2017-06-11 NOTE — Progress Notes (Signed)
[facility] BRIEF SICU PROGRESS NOTE  2 Days Post-Op  S/P Procedure(s) (LRB): CORONARY ARTERY BYPASS GRAFTING (CABG) x3 WITH TEE USING LEFT INTERNAL MAMMARY ARTERY AND RIGHT  SAPHENOUS LEG VEIN HARVESTED ENDOSCOPICALLY (N/A)   Episode profound bradycardia earlier today after IV amiodarone bolus Now reintubated on vent Neurologically grossly intact Afib - VVI paced Hemodynamics stable on levophed, milrinone and dopamine drips Remains oliguric  Plan: Continue current plan  Purcell Nailslarence H Owen, MD 06/11/2017 4:59 PM

## 2017-06-11 NOTE — Procedures (Signed)
Intubation Procedure Note Philip Love [id number] 05/12/1941  Procedure: Intubation Indications: Airway protection and maintenance, mixed acidosis, acute respiratory failure  Procedure Details Consent: Unable to obtain consent because of altered level of consciousness. Time Out: Verified patient identification, verified procedure, site/side was marked, verified correct patient position, special equipment/implants available, medications/allergies/relevent history reviewed, required imaging and test results available.  Performed  Maximum sterile technique was used including gloves, hand hygiene and mask.  MAC 4  Called to room by RN due to acute / subacute change in patient's status - progressive unresponsiveness, poor respiratory effort and impending failure, mixed acidosis on his am ABG. Hx obesity and OSA, DM, CKD, CAD s/p urgent CABG this admit. On my eval he was unresponsive, unable to be awakened. Agonal resp pattern. Bag-mask ventilation had already been initiated.   Meds: etomidate 67m   8.0 ETT placed under direct visualization using MAC-4 without difficulty. Glottis suctioned, thin secretions before intubation. No evidence aspiration or mucous. Placed on first pass, position confirmed by direct vis, ETCO2, auscultation. Transient drop in SpO2 to 70's, quickly recovered when bagged. BP stable throughout. Dr VDarcey Noracalled and notified about patient's status and plan by bedside RN. Will check CXR, ABG now.   Evaluation Hemodynamic Status: BP stable throughout; O2 sats: transiently fell during during procedure Patient's Current Condition: stable Complications: No apparent complications Patient did tolerate procedure well. Chest X-ray ordered to verify placement.  CXR: pending.   RBaltazar Apo MD, PhD 06/11/2017, 9:47 AM [facility] [phone] if no answer [phone]

## 2017-06-11 NOTE — Progress Notes (Signed)
2 Days Post-Op Procedure(s) (LRB): CORONARY ARTERY BYPASS GRAFTING (CABG) x3 WITH TEE USING LEFT INTERNAL MAMMARY ARTERY AND RIGHT  SAPHENOUS LEG VEIN HARVESTED ENDOSCOPICALLY (N/A) Subjective: emerg CABG Pul htn, OSA Acute on chronc renal failure afib Weak with wet cough- NTSed for culture Objective: Vital signs in last 24 hours: Temp:  [97.7 F (36.5 C)-100.2 F (37.9 C)] 98.6 F (37 C) (07/31 0810) Pulse Rate:  [46-133] 129 (07/31 0800) Cardiac Rhythm: Atrial fibrillation (07/31 0400) Resp:  [16-30] 23 (07/31 0800) BP: (109-153)/(35-115) 111/39 (07/31 0800) SpO2:  [86 %-99 %] 96 % (07/31 0800) Arterial Line BP: (76-147)/(38-59) 97/44 (07/31 0800) FiO2 (%):  [35 %-40 %] 40 % (07/31 0400) Weight:  [280 lb 6.8 oz (127.2 kg)] 280 lb 6.8 oz (127.2 kg) (07/31 0500)  Hemodynamic parameters for last 24 hours: PAP: (32-50)/(12-37) 43/26 CO:  [6.4 L/min-7.7 L/min] 7 L/min CI:  [2.8 L/min/m2-3.4 L/min/m2] 3.1 L/min/m2  Intake/Output from previous day: 07/30 0701 - 07/31 0700 In: 2895.9 [I.V.:2545.9; IV Piggyback:350] Out: 868 [Urine:468; Chest Tube:400] Intake/Output this shift: Total I/O In: 150.6 [I.V.:150.6] Out: 95 [Urine:15; Chest Tube:80]  Responsive Weak  afib 8L mask  Lab Results:  Recent Labs  06/10/17 1615 06/10/17 1626 06/11/17 0500  WBC 27.2*  --  30.3*  HGB 9.0* 9.5* 8.5*  HCT 27.7* 28.0* 26.0*  PLT 193  --  191   BMET:  Recent Labs  06/10/17 0602  06/10/17 1626 06/11/17 0500  NA 133*  --  133* 128*  K 4.1  --  4.8 4.8  CL 104  --  100* 99*  CO2 22  --   --  19*  GLUCOSE 108*  --  203* 329*  BUN 22*  --  24* 29*  CREATININE 1.78*  < > 2.30* 3.06*  CALCIUM 7.3*  --   --  7.4*  < > = values in this interval not displayed.  PT/INR:  Recent Labs  17-Apr-2017 1516  LABPROT 17.9*  INR 1.47   ABG    Component Value Date/Time   PHART 7.249 (L) 06/11/2017 0807   HCO3 19.6 (L) 06/11/2017 0807   TCO2 21 06/11/2017 0807   ACIDBASEDEF 7.0 (H)  06/11/2017 0807   O2SAT 91.0 06/11/2017 0807   CBG (last 3)   Recent Labs  06/10/17 1946 06/10/17 2316 06/11/17 0805  GLUCAP 251* 298* 317*    Assessment/Plan: S/P Procedure(s) (LRB): CORONARY ARTERY BYPASS GRAFTING (CABG) x3 WITH TEE USING LEFT INTERNAL MAMMARY ARTERY AND RIGHT  SAPHENOUS LEG VEIN HARVESTED ENDOSCOPICALLY (N/A) Mobilize Diuresis Diabetes control renal; consult   LOS: 6 days    Philip Love 06/11/2017

## 2017-06-11 NOTE — Consult Note (Signed)
[facility] / CRITICAL CARE MEDICINE   Name: Philip Love MRN: [id number] DOB: 27-Aug-1941    ADMISSION DATE:  06/16/17 CONSULTATION DATE:  06/11/2017  REFERRING MD:  Morton Peters  CHIEF COMPLAINT:  Respiratory Decompensation/ vent management  HISTORY OF PRESENT ILLNESS:   76 year old obese former smoker with OSA , and insulin-dependent diabetes  recently moved from [place] to [facility]. He presented to the emergency department there with 2 weeks of progressive shortness of breath ankle swelling and chest discomfort. He had nonspecific EKG changes but was in sinus rhythm. Cardiac enzymes were mildly positive and chest x-ray showed mild CHF. He was transferred to this hospital. His creatinine was 2. Echocardiogram showed EF of 45%. No significant MR. Creatinine improved to 1.7 and he underwent coronary angiography and right heart cath. Coronary showed a 90% left main stenosis, proximal 80-90% LAD stenosis, 90% stenosis of the mid RCA and 80% stenosis of the proximal circumflex. LVEDP is 12. Right heart cath showed cardiac output of 5.5 L/m with normal right-sided pressures. The patient was admitted to the ICU placed on IV heparin and nitroglycerin. His creatinine post cath remains at 1.7.  Blood sugars are fairly well controlled.Patient underwent CABG x 3 with TEE using Left internal mammary artery and right saphenous leg vein on  06/05/2017. He was recovering in the CVICU, was extubated 06/10/2017. He refused CPAP the night of 7/30-7/31> He was in atrial fibrillation and when bolused with Amio he had a rhythm change with long pauses and significant change in mental status requiring   emergent re-intubation per Dr. Delton Coombes 7/31. ABG indicates a metabolic acidosis.( 7.311/ 41.5/390/22/ 21.1)  CCM have been consulted for vent management and evaluation of respiratory decompensation.     PAST MEDICAL HISTORY :  He  has a past medical history of Arthritis; CHF (congestive heart failure) (HCC)  (06-16-2017); Chronic lower back pain; Essential hypertension; Hyperlipidemia; Obesity (BMI 30-39.9); and Type 2 diabetes mellitus (HCC).  PAST SURGICAL HISTORY: He  has a past surgical history that includes Back surgery; Tonsillectomy; Appendectomy; Lumbar disc surgery (~ 2010 or after); Eye surgery (Bilateral, ~ 2016); Right/Left Heart Cath and Coronary Angiography (N/A, 05/24/2017); and Coronary artery bypass graft (N/A, 05/27/2017).  Allergies  Allergen Reactions  . Bee Venom Swelling    No current facility-administered medications on file prior to encounter.    No current outpatient prescriptions on file prior to encounter.    FAMILY HISTORY:  His indicated that his mother is deceased. He indicated that his father is deceased. He indicated that all of his four sisters are deceased. He indicated that his brother is deceased.    SOCIAL HISTORY: He  reports that he quit smoking about 43 years ago. His smoking use included Cigarettes. He has a 3.00 pack-year smoking history. He has never used smokeless tobacco. He reports that he does not drink alcohol or use drugs.  REVIEW OF SYSTEMS:   Unable, sedated and intubated  SUBJECTIVE:  Obese male supine in bed, intubated and sedated.  VITAL SIGNS: BP (!) 162/56   Pulse 80   Temp 98.6 F (37 C)   Resp 18   Ht 5\' 8"  (1.727 m)   Wt 280 lb 6.8 oz (127.2 kg)   SpO2 100%   BMI 42.64 kg/m   HEMODYNAMICS: PAP: (32-50)/(12-37) 43/26 CO:  [6.4 L/min-7.7 L/min] 7 L/min CI:  [2.8 L/min/m2-3.4 L/min/m2] 3.1 L/min/m2  VENTILATOR SETTINGS: FiO2 (%):  [35 %-100 %] 100 % Set Rate:  [18 bmp] 18 bmp  Vt Set:  [550 mL] 550 mL PEEP:  [5 cmH20] 5 cmH20 Plateau Pressure:  [35 cmH20] 35 cmH20  INTAKE / OUTPUT: I/O last 3 completed shifts: In: 4952.4 [I.V.:4602.4; IV Piggyback:350] Out: 1413 [Urine:793; Chest Tube:620]  PHYSICAL EXAMINATION: General:  Sedated obese male supine in bed, ETT secure, sedated on vent Neuro: Sedated, prior to  intubation was following commands, MAE x 4, appropriate  HEENT:  ETT, Normocephalic/ atraumatic, OG tube  Cardiovascular: Paced rhythm with some intrinsic perfusion, S1, S1, No RMG  Lungs:  Coarse throughout, Diminished in the bases, chest tube to suction with scant bloody drainage Abdomen:  Obese, No audible BS, non-tender, soft to palpation Musculoskeletal:  No obvious deformities Skin:  Intact , surgical incision, external pacer wires,   LABS:  BMET  Recent Labs Lab 06/04/2017 0600  06/10/17 0602 06/10/17 1615 06/10/17 1626 06/11/17 0500  NA 133*  < > 133*  --  133* 128*  K 4.2  < > 4.1  --  4.8 4.8  CL 100*  < > 104  --  100* 99*  CO2 26  --  22  --   --  19*  BUN 26*  < > 22*  --  24* 29*  CREATININE 1.49*  < > 1.78* 2.30* 2.30* 3.06*  GLUCOSE 98  < > 108*  --  203* 329*  < > = values in this interval not displayed.  Electrolytes  Recent Labs Lab 06/08/2017 0600 05/17/2017 2102 06/10/17 0602 06/10/17 1615 06/11/17 0500  CALCIUM 8.2*  --  7.3*  --  7.4*  MG  --  3.2* 2.5* 2.7*  --     CBC  Recent Labs Lab 06/10/17 0602 06/10/17 1615 06/10/17 1626 06/11/17 0500  WBC 18.2* 27.2*  --  30.3*  HGB 9.0* 9.0* 9.5* 8.5*  HCT 26.6* 27.7* 28.0* 26.0*  PLT 180 193  --  191    Coag's  Recent Labs Lab 06/03/2017 1723 06/08/17 0725 06/08/17 1304 06/01/2017 1516  APTT 37*  --  185* 42*  INR 1.10 1.18 1.20 1.47    Sepsis Markers No results for input(s): LATICACIDVEN, PROCALCITON, O2SATVEN in the last 168 hours.  ABG  Recent Labs Lab 06/10/17 1020 06/11/17 0807 06/11/17 0949  PHART 7.369 7.249* 7.315*  PCO2ART 44.2 44.8 41.5  PO2ART 69.0* 72.0* 390.0*    Liver Enzymes  Recent Labs Lab 06/08/17 0725  AST 27  ALT 14*  ALKPHOS 46  BILITOT 0.5  ALBUMIN 3.2*    Cardiac Enzymes  Recent Labs Lab 05/19/2017 2351 06/06/17 0526 06/06/17 1120  TROPONINI 1.49* 1.46* 1.16*    Glucose  Recent Labs Lab 06/10/17 1555 06/10/17 1946 06/10/17 2316  06/11/17 0337 06/11/17 0805 06/11/17 0942  GLUCAP 184* 251* 298* 335* 317* 340*    Imaging Dg Chest Port 1 View  Result Date: 06/11/2017 CLINICAL DATA:  ET tube placement EXAM: PORTABLE CHEST 1 VIEW COMPARISON:  06/11/2017 FINDINGS: Endotracheal tube is 3.5 cm above the carina. Changes of CABG. Interval placement of NG tube in the stomach. Swan-Ganz catheter tip is in the main pulmonary artery. Left chest tube is in place without pneumothorax. Cardiomegaly with vascular congestion and mild interstitial edema, improved since prior study. IMPRESSION: Improving interstitial edema pattern. Endotracheal tube is 3.5 cm above the carina. No pneumothorax. Electronically Signed   By: Charlett NoseKevin  Dover M.D.   On: 06/11/2017 10:16   Dg Chest Port 1 View  Result Date: 06/11/2017 CLINICAL DATA:  76 year old male with shortness breath. Post CABG.  Subsequent encounter. EXAM: PORTABLE CHEST 1 VIEW COMPARISON:  06/10/2017 and 2017/01/08. FINDINGS: Post CABG.  Vertically directed wires next to sternum unchanged. Cardiomegaly. Pulmonary vascular congestion superimposed upon chronic lung changes. Left chest tube in place without gross pneumothorax. Swan-Ganz catheter enters from the right with tip at the level of the main pulmonary artery/pulmonary outflow tract. Nasogastric tube and endotracheal tube removed. IMPRESSION: Nasogastric tube and endotracheal tube removed otherwise no significant change as detailed above. Electronically Signed   By: Lacy DuverneySteven  Olson M.D.   On: 06/11/2017 07:38     STUDIES:  Echo 06/06/2017>>EF 40-45%, Moderate LVH, Mildly to moderately  reduced systolic dysfunction, MV mild regurgitation, LA mildly dilated RHC  CULTURES: Sputum 7/31>>  ANTIBIOTICS: Ceftazidine 7/31 Zinacef 7/29  SIGNIFICANT EVENTS: 05/23/2017>> CABG 06/10/2017>> Extubation 06/11/2017>> Emergent re-intubation for mixed respiratory/metabolic acidosis  LINES/TUBES: R IJ Swan 7/29 Left and Mediastinal CT>> 7/29 R  Radial A Line 7/29  ETT 7/31  DISCUSSION: Pt. Post CABG 7/29, extubated 7/30, developed mixed respiratory / metabolic acidosis 7/31 am after refusing CPAP pm of 7/30, with suspected hypoperfusion 2/2 artrial fibrillation/  rhythm change ( blocked down with Amio bolus). Additionally worsening renal function is a contributing factor. Dr. Delton CoombesByrum emergently intubated the patient 7/31. ABG is improving on mechanical ventilation. Rhythm is now intermittently paced. We will check lactic acid and  rest on vent 7/31 and re-evaluate in am regarding readiness to wean/ extubate after ABG and trending lactates.PCCM to follow.   ASSESSMENT / PLAN:  PULMONARY A: Mixed Respiratory/Metabolic Acidosis ( Hypo perfusion. RF) Suspected OSA/OHS ABG indicates resolving a with MV CXR : Cardiomegaly with vascular congestion and mild interstitial edema, improved since prior study. P:   Titrate FIO2 / Oxygen for saturations > 94% Full vent support 7/31 ABG 8/1/ am or prn  VAP Bundle SBT in am if stable and acidosis resolved CXR 06/12/2017 Will need nocturnal CPAP upon extubation  Will need out patient follow up for suspected  OSA/OHA   CARDIOVASCULAR A:  Hypoperfusion after amio bolus for a fib Now paced Remains on Amio, Lidocaine, Milrinone, dopamine and Levo P: Telemetry monitoring  Maintain MAP > 65 to ensure adequate end organ perfusion 12 Lead EKG 8/1 am  Rhythm control per surgery Consider trending troponins Trend Lactic Acid 7/31 and 8/1   RENAL A:   Acute on Chronic Kidney Disease contributing to mixed respiratory/ metabolic acidosis Creatinine 1.43 on admission  Hypermag  P:   Trend BMET Mag 8/1 Phos 8/1 Replete Electrolytes as needed Avoid Nephrotoxic drugs Pharmacy assist with dose adjustment appreciated Maintain renal perfusion Strict I&O   GASTROINTESTINAL A:   No Acute Issues BS diminished/ absent P:   ONG:[id number]:Protonix q 24 Hold TF for now , if no extubation 8/1 will start  TF .   HEMATOLOGIC A:   Anemia P:  Trend CBC Transfuse for HGB <7 DVT prophylaxis:SCD's Observe for obvious signs of bleeding/ Monitor chest tube drainage   INFECTIOUS A:   Leukocytosis T Max 100.2 P:   Trend fever/ WBC Curve Trend Lactate Cultures as above ABX as above Follow micro for results Narrow ABX therapy once sensitivities available  ENDOCRINE A:   Hx: IDDM  HGB A1C 6.3 Insulin gtt  P:   CBG Q 1 Transition for insulin gtt per order set/  per surgery Transition back to home regimen as appropriate  NEUROLOGIC A:   Awake and alert, following commands prior to re-intubation P:   RASS goal: 0 to -1 Neuro Checks per unit  routine  Sedation for vent synchrony   FAMILY  - Updates: Wife and son updated at bedside by both Dr, Delton Coombes and Clabe Seal  - Inter-disciplinary family meet or Palliative Care meeting due by: 06/14/2017.    Saralyn Pilar, NP Pulmonary and Critical Care Medicine [facility] Pager: [phone]  06/11/2017, 11:30 AM  Attending Note:  I have examined patient, reviewed labs, studies and notes. I have discussed the case with Saralyn Pilar, and I agree with the data and plans as amended above. 76 yo man with obesity, OSA, DM, CAd s/p urgent CABG on 7/29 and able to be extubated 7/30. He experienced progressive altered MS and resp failure am 7/31. He had received amiodarone bolus for A fib, experienced rhythm change w pauses. Also note he did not wear CPAP on 7/30, had had progressive renal failure for which he has not been able to fully compensate. On my initial eval he was agonal, had shallow breaths. I intubated him without incident. On follow up eval he was comfortable on MV. pacer now 100% V-paced. Starting to wake up some. Lungs distant and coarse. Chest tubes in place. Abdomen benign. Suspect that his decline was multi-factorial due to progression of mild metabolic acidosis, failure to wear CPAP for OSA, his rhythm disturbance after receiving  amiodarone bolus am 7/31. We will check lactate, follow ABG and overall resp status. Volume removal when renal fxn will allow. Leave him intubated for today and assess for possible SBT's on 8/1.   Independent critical care time is 40 minutes.   Levy Pupa, MD, PhD 06/11/2017, 1:11 PM [facility] [phone] or if no answer [phone]

## 2017-06-11 NOTE — Procedures (Signed)
Intubation Procedure Note Philip Love [id number] [birth year]  Procedure: Intubation Indications: Airway protection and maintenance  Procedure Details Consent: Unable to obtain consent because of altered level of consciousness. Time Out: Verified patient identification, verified procedure, site/side was marked, verified correct patient position, special equipment/implants available, medications/allergies/relevent history reviewed, required imaging and test results available.  Performed  Maximum sterile technique was used including cap, gloves, gown, hand hygiene and mask.  MAC and 4    Evaluation Hemodynamic Status: BP stable throughout; O2 sats: transiently fell during during procedure and currently acceptable Patient's Current Condition: stable Complications: No apparent complications Patient did tolerate procedure well. Chest X-ray ordered to verify placement.  CXR: pending.  Pt intubated using Mac #4 blade on 1st attempt with 8.0 ett secured at 25 at the lip. Bilateral BS, direct visualization, positive color change noted on etco2, cxr pending. Pt spo2 dropped during procedure to 70% but quickly improved to 100% with manual ventilation. RT will continue to monitor.    Jesse Sans 06/11/2017

## 2017-06-11 NOTE — Consult Note (Signed)
[facility] Renal Consultation Note  Requesting MD: Prescott Gum Indication for Consultation: A on CRF  HPI:  Philip Love is a 76 y.o. male with past medical history significant for hypertension, diabetes mellitus, OSA, chronic low back pain.  He presented to the hospital on 7/25 with shortness of breath and heart failure symptoms, troponin was elevated.  When he presented his creatinine was 1.4, plan was for him to undergo a cardiac catheterization at that was put off for rising creatinine. It was done on 7/27 when creatinine was 1.96. After that creatinine improved to 1.1-1.2.  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 This took place on 7/29. Since surgery, creatinine has risen daily and currently at a level of 3.06. Urine output is decreasing. It was noted to be 1502 days ago, 468 yesterday and only 50 mL recorded today. Blood pressure has been reasonable, however a course on multitude of drips postop. He failed extubation and had to be reintubated. Lisinopril given but only briefly pre-CABG -  no other obvious nephrotoxic medications noted. Urinalysis bland on 7/27  Creatinine, Ser  Date/Time Value Ref Range Status  06/11/2017 05:00 AM 3.06 (H) 0.61 - 1.24 mg/dL Final  06/10/2017 04:26 PM 2.30 (H) 0.61 - 1.24 mg/dL Final  06/10/2017 04:15 PM 2.30 (H) 0.61 - 1.24 mg/dL Final  06/10/2017 06:02 AM 1.78 (H) 0.61 - 1.24 mg/dL Final  05/30/2017 09:15 PM 1.40 (H) 0.61 - 1.24 mg/dL Final  05/28/2017 09:02 PM 1.55 (H) 0.61 - 1.24 mg/dL Final  06/04/2017 01:39 PM 1.20 0.61 - 1.24 mg/dL Final  06/11/2017 01:17 PM 1.10 0.61 - 1.24 mg/dL Final  06/02/2017 12:19 PM 1.10 0.61 - 1.24 mg/dL Final  05/31/2017 11:16 AM 1.00 0.61 - 1.24 mg/dL Final  05/27/2017 10:59 AM 1.10 0.61 - 1.24 mg/dL Final  05/20/2017 09:53 AM 1.30 (H) 0.61 - 1.24 mg/dL Final  05/24/2017 08:08 AM 1.30 (H) 0.61 - 1.24 mg/dL Final  05/18/2017 06:00 AM 1.49 (H) 0.61 - 1.24 mg/dL Final  05/30/2017 12:32 AM  1.51 (H) 0.61 - 1.24 mg/dL Final  06/08/2017 07:25 AM 1.72 (H) 0.61 - 1.24 mg/dL Final  05/19/2017 01:25 PM 1.96 (H) 0.61 - 1.24 mg/dL Final  05/27/2017 10:48 AM 2.07 (H) 0.61 - 1.24 mg/dL Final  05/18/2017 04:00 AM 2.05 (H) 0.61 - 1.24 mg/dL Final  06/06/2017 07:14 AM 1.77 (H) 0.61 - 1.24 mg/dL Final  06/08/2017 05:23 PM 1.43 (H) 0.61 - 1.24 mg/dL Final     PMHx:   Past Medical History:  Diagnosis Date  . Arthritis    "generalized; elbows; knees" (05/20/2017)  . CHF (congestive heart failure) ([place]) 05/27/2017  . Chronic lower back pain   . Essential hypertension   . Hyperlipidemia   . Obesity (BMI 30-39.9)   . Type 2 diabetes mellitus ([place])     Past Surgical History:  Procedure Laterality Date  . APPENDECTOMY    . BACK SURGERY    . CORONARY ARTERY BYPASS GRAFT N/A 05/22/2017   Procedure: CORONARY ARTERY BYPASS GRAFTING (CABG) x3 WITH TEE USING LEFT INTERNAL MAMMARY ARTERY AND RIGHT  SAPHENOUS LEG VEIN HARVESTED ENDOSCOPICALLY;  Surgeon: Ivin Poot, MD;  Location: [place];  Service: Open Heart Surgery;  Laterality: N/A;  . EYE SURGERY Bilateral ~ 2016   "bleeding veins"  . LUMBAR DISC SURGERY  ~ 2010 or after  . RIGHT/LEFT HEART CATH AND CORONARY ANGIOGRAPHY N/A 06/01/2017   Procedure: Right/Left Heart Cath and Coronary Angiography;  Surgeon: Martinique, Peter M,  MD;  Location: [place] [facility];  Service: Cardiovascular;  Laterality: N/A;  . TONSILLECTOMY      Family Hx:  Family History  Problem Relation Age of Onset  . Heart disease Mother 52  . Heart attack Father 60  . Diabetes Father   . Heart attack Sister 32  . Congestive Heart Failure Sister     Social History:  reports that he quit smoking about 43 years ago. His smoking use included Cigarettes. He has a 3.00 pack-year smoking history. He has never used smokeless tobacco. He reports that he does not drink alcohol or use drugs.  Allergies:  Allergies  Allergen Reactions  . Bee Venom Swelling     Medications: Prior to Admission medications   Medication Sig Start Date End Date Taking? Authorizing Provider  amLODipine (NORVASC) 2.5 MG tablet Take 2.5 mg by mouth daily.   Yes [provider]  aspirin EC 81 MG tablet Take 81 mg by mouth daily.   Yes [provider]  Carboxymethylcellul-Glycerin (CLEAR EYES FOR DRY EYES OP) Place 1-2 drops into both eyes daily as needed.   Yes [provider]  fenofibrate 160 MG tablet Take 160 mg by mouth daily.   Yes [provider]  gabapentin (NEURONTIN) 300 MG capsule Take 300 mg by mouth 2 (two) times daily.   Yes [provider]  hydrochlorothiazide (HYDRODIURIL) 50 MG tablet Take 50 mg by mouth daily.   Yes [provider]  Javier Docker Oil 350 MG CAPS Take 350 mg by mouth daily.   Yes [provider]  lisinopril (PRINIVIL,ZESTRIL) 10 MG tablet Take 10 mg by mouth daily.   Yes [provider]  metoprolol tartrate (LOPRESSOR) 25 MG tablet Take 25 mg by mouth 2 (two) times daily.   Yes [provider]  Multiple Vitamin (MULTIVITAMIN WITH MINERALS) TABS tablet Take 1 tablet by mouth daily.   Yes [provider]  naproxen sodium (ANAPROX) 220 MG tablet Take 440 mg by mouth daily as needed.   Yes [provider]  simvastatin (ZOCOR) 20 MG tablet Take 20 mg by mouth daily.   Yes [provider]    I have reviewed the patient's current medications.  Labs:  Results for orders placed or performed during the hospital encounter of 06/03/2017 (from the past 48 hour(s))  I-STAT 4, (NA,K, GLUC, HGB,HCT)     Status: Abnormal   Collection Time: 06/08/2017  3:05 PM  Result Value Ref Range   Sodium 137 135 - 145 mmol/L   Potassium 4.1 3.5 - 5.1 mmol/L   Glucose, Bld 175 (H) 65 - 99 mg/dL   HCT 33.0 (L) 39.0 - 52.0 %   Hemoglobin 11.2 (L) 13.0 - 17.0 g/dL  CBC     Status: Abnormal   Collection Time: 06/02/2017  3:16 PM  Result Value Ref Range   WBC 25.6 (H) 4.0  - 10.5 K/uL   RBC 3.64 (L) 4.22 - 5.81 MIL/uL   Hemoglobin 10.4 (L) 13.0 - 17.0 g/dL   HCT 31.3 (L) 39.0 - 52.0 %   MCV 86.0 78.0 - 100.0 fL   MCH 28.6 26.0 - 34.0 pg   MCHC 33.2 30.0 - 36.0 g/dL   RDW 14.6 11.5 - 15.5 %   Platelets 211 150 - 400 K/uL  Protime-INR     Status: Abnormal   Collection Time: 06/03/2017  3:16 PM  Result Value Ref Range   Prothrombin Time 17.9 (H) 11.4 - 15.2 seconds  INR 1.47   APTT     Status: Abnormal   Collection Time: 06/01/2017  3:16 PM  Result Value Ref Range   aPTT 42 (H) 24 - 36 seconds    Comment:        IF BASELINE aPTT IS ELEVATED, SUGGEST PATIENT RISK ASSESSMENT BE USED TO DETERMINE APPROPRIATE ANTICOAGULANT THERAPY.   Glucose, capillary     Status: Abnormal   Collection Time: 05/27/2017  4:09 PM  Result Value Ref Range   Glucose-Capillary 141 (H) 65 - 99 mg/dL  I-STAT 3, arterial blood gas (G3+)     Status: Abnormal   Collection Time: 05/23/2017  4:10 PM  Result Value Ref Range   pH, Arterial 7.309 (L) 7.350 - 7.450   pCO2 arterial 33.0 32.0 - 48.0 mmHg   pO2, Arterial 89.0 83.0 - 108.0 mmHg   Bicarbonate 16.8 (L) 20.0 - 28.0 mmol/L   TCO2 18 0 - 100 mmol/L   O2 Saturation 97.0 %   Acid-base deficit 9.0 (H) 0.0 - 2.0 mmol/L   Patient temperature 35.9 C    Collection site ARTERIAL LINE    Drawn by Operator    Sample type ARTERIAL   Glucose, capillary     Status: Abnormal   Collection Time: 06/04/2017  5:09 PM  Result Value Ref Range   Glucose-Capillary 153 (H) 65 - 99 mg/dL   Comment 1 Arterial Specimen   I-STAT 3, arterial blood gas (G3+)     Status: Abnormal   Collection Time: 06/04/2017  5:12 PM  Result Value Ref Range   pH, Arterial 7.351 7.350 - 7.450   pCO2 arterial 39.7 32.0 - 48.0 mmHg   pO2, Arterial 95.0 83.0 - 108.0 mmHg   Bicarbonate 22.0 20.0 - 28.0 mmol/L   TCO2 23 0 - 100 mmol/L   O2 Saturation 97.0 %   Acid-base deficit 3.0 (H) 0.0 - 2.0 mmol/L   Patient temperature 36.7 C    Collection site ARTERIAL LINE    Drawn  by Operator    Sample type ARTERIAL   Glucose, capillary     Status: Abnormal   Collection Time: 05/30/2017  6:07 PM  Result Value Ref Range   Glucose-Capillary 153 (H) 65 - 99 mg/dL   Comment 1 Arterial Specimen   Glucose, capillary     Status: Abnormal   Collection Time: 06/02/2017  7:07 PM  Result Value Ref Range   Glucose-Capillary 146 (H) 65 - 99 mg/dL   Comment 1 Arterial Specimen   Glucose, capillary     Status: Abnormal   Collection Time: 05/24/2017  8:14 PM  Result Value Ref Range   Glucose-Capillary 138 (H) 65 - 99 mg/dL   Comment 1 Arterial Specimen    Comment 2 Notify RN   CBC     Status: Abnormal   Collection Time: 05/30/2017  9:02 PM  Result Value Ref Range   WBC 19.2 (H) 4.0 - 10.5 K/uL   RBC 3.22 (L) 4.22 - 5.81 MIL/uL   Hemoglobin 9.3 (L) 13.0 - 17.0 g/dL   HCT 27.8 (L) 39.0 - 52.0 %   MCV 86.3 78.0 - 100.0 fL   MCH 28.9 26.0 - 34.0 pg   MCHC 33.5 30.0 - 36.0 g/dL   RDW 14.8 11.5 - 15.5 %   Platelets 204 150 - 400 K/uL  Creatinine, serum     Status: Abnormal   Collection Time: 06/06/2017  9:02 PM  Result Value Ref Range   Creatinine, Ser 1.55 (H) 0.61 -  1.24 mg/dL   GFR calc non Af Amer 43 (L) >60 mL/min   GFR calc Af Amer 50 (L) >60 mL/min    Comment: (NOTE) The eGFR has been calculated using the CKD EPI equation. This calculation has not been validated in all clinical situations. eGFR's persistently <60 mL/min signify possible Chronic Kidney Disease.   Magnesium     Status: Abnormal   Collection Time: 05/15/2017  9:02 PM  Result Value Ref Range   Magnesium 3.2 (H) 1.7 - 2.4 mg/dL  I-STAT, chem 8     Status: Abnormal   Collection Time: 05/12/2017  9:15 PM  Result Value Ref Range   Sodium 138 135 - 145 mmol/L   Potassium 3.7 3.5 - 5.1 mmol/L   Chloride 101 101 - 111 mmol/L   BUN 23 (H) 6 - 20 mg/dL   Creatinine, Ser 1.40 (H) 0.61 - 1.24 mg/dL   Glucose, Bld 137 (H) 65 - 99 mg/dL   Calcium, Ion 1.05 (L) 1.15 - 1.40 mmol/L   TCO2 23 0 - 100 mmol/L    Hemoglobin 9.9 (L) 13.0 - 17.0 g/dL   HCT 29.0 (L) 39.0 - 52.0 %  Glucose, capillary     Status: Abnormal   Collection Time: 05/31/2017 10:19 PM  Result Value Ref Range   Glucose-Capillary 146 (H) 65 - 99 mg/dL   Comment 1 Arterial Specimen    Comment 2 Notify RN   Glucose, capillary     Status: Abnormal   Collection Time: 05/23/2017 11:30 PM  Result Value Ref Range   Glucose-Capillary 140 (H) 65 - 99 mg/dL   Comment 1 Arterial Specimen    Comment 2 Notify RN   Glucose, capillary     Status: Abnormal   Collection Time: 06/10/17 12:35 AM  Result Value Ref Range   Glucose-Capillary 136 (H) 65 - 99 mg/dL   Comment 1 Arterial Specimen    Comment 2 Notify RN   Glucose, capillary     Status: Abnormal   Collection Time: 06/10/17  1:45 AM  Result Value Ref Range   Glucose-Capillary 132 (H) 65 - 99 mg/dL   Comment 1 Arterial Specimen    Comment 2 Notify RN   Glucose, capillary     Status: Abnormal   Collection Time: 06/10/17  2:50 AM  Result Value Ref Range   Glucose-Capillary 128 (H) 65 - 99 mg/dL   Comment 1 Arterial Specimen    Comment 2 Notify RN   Glucose, capillary     Status: Abnormal   Collection Time: 06/10/17  3:53 AM  Result Value Ref Range   Glucose-Capillary 114 (H) 65 - 99 mg/dL   Comment 1 Arterial Specimen    Comment 2 Notify RN   Glucose, capillary     Status: Abnormal   Collection Time: 06/10/17  4:58 AM  Result Value Ref Range   Glucose-Capillary 109 (H) 65 - 99 mg/dL  I-STAT 3, arterial blood gas (G3+)     Status: None   Collection Time: 06/10/17  5:57 AM  Result Value Ref Range   pH, Arterial 7.450 7.350 - 7.450   pCO2 arterial 37.4 32.0 - 48.0 mmHg   pO2, Arterial 99.0 83.0 - 108.0 mmHg   Bicarbonate 26.0 20.0 - 28.0 mmol/L   TCO2 27 0 - 100 mmol/L   O2 Saturation 98.0 %   Acid-Base Excess 2.0 0.0 - 2.0 mmol/L   Patient temperature 98.6 F    Sample type ARTERIAL   CBC  Status: Abnormal   Collection Time: 06/10/17  6:02 AM  Result Value Ref Range    WBC 18.2 (H) 4.0 - 10.5 K/uL   RBC 3.13 (L) 4.22 - 5.81 MIL/uL   Hemoglobin 9.0 (L) 13.0 - 17.0 g/dL   HCT 26.6 (L) 39.0 - 52.0 %   MCV 85.0 78.0 - 100.0 fL   MCH 28.8 26.0 - 34.0 pg   MCHC 33.8 30.0 - 36.0 g/dL   RDW 14.8 11.5 - 15.5 %   Platelets 180 150 - 400 K/uL  Basic metabolic panel     Status: Abnormal   Collection Time: 06/10/17  6:02 AM  Result Value Ref Range   Sodium 133 (L) 135 - 145 mmol/L   Potassium 4.1 3.5 - 5.1 mmol/L   Chloride 104 101 - 111 mmol/L   CO2 22 22 - 32 mmol/L   Glucose, Bld 108 (H) 65 - 99 mg/dL   BUN 22 (H) 6 - 20 mg/dL   Creatinine, Ser 1.78 (H) 0.61 - 1.24 mg/dL   Calcium 7.3 (L) 8.9 - 10.3 mg/dL   GFR calc non Af Amer 36 (L) >60 mL/min   GFR calc Af Amer 42 (L) >60 mL/min    Comment: (NOTE) The eGFR has been calculated using the CKD EPI equation. This calculation has not been validated in all clinical situations. eGFR's persistently <60 mL/min signify possible Chronic Kidney Disease.    Anion gap 7 5 - 15  Magnesium     Status: Abnormal   Collection Time: 06/10/17  6:02 AM  Result Value Ref Range   Magnesium 2.5 (H) 1.7 - 2.4 mg/dL  Glucose, capillary     Status: Abnormal   Collection Time: 06/10/17  6:05 AM  Result Value Ref Range   Glucose-Capillary 108 (H) 65 - 99 mg/dL  Glucose, capillary     Status: Abnormal   Collection Time: 06/10/17  8:05 AM  Result Value Ref Range   Glucose-Capillary 137 (H) 65 - 99 mg/dL   Comment 1 Arterial Specimen   I-STAT 3, arterial blood gas (G3+)     Status: None   Collection Time: 06/10/17  8:54 AM  Result Value Ref Range   pH, Arterial 7.401 7.350 - 7.450   pCO2 arterial 37.2 32.0 - 48.0 mmHg   pO2, Arterial 83.0 83.0 - 108.0 mmHg   Bicarbonate 22.9 20.0 - 28.0 mmol/L   TCO2 24 0 - 100 mmol/L   O2 Saturation 96.0 %   Acid-base deficit 1.0 0.0 - 2.0 mmol/L   Patient temperature 37.9 C    Collection site ARTERIAL LINE    Drawn by Nurse    Sample type ARTERIAL   Glucose, capillary     Status:  Abnormal   Collection Time: 06/10/17  9:04 AM  Result Value Ref Range   Glucose-Capillary 128 (H) 65 - 99 mg/dL  Glucose, capillary     Status: Abnormal   Collection Time: 06/10/17 10:17 AM  Result Value Ref Range   Glucose-Capillary 128 (H) 65 - 99 mg/dL   Comment 1 Arterial Specimen   I-STAT 3, arterial blood gas (G3+)     Status: Abnormal   Collection Time: 06/10/17 10:20 AM  Result Value Ref Range   pH, Arterial 7.369 7.350 - 7.450   pCO2 arterial 44.2 32.0 - 48.0 mmHg   pO2, Arterial 69.0 (L) 83.0 - 108.0 mmHg   Bicarbonate 25.3 20.0 - 28.0 mmol/L   TCO2 27 0 - 100 mmol/L   O2 Saturation 92.0 %  Patient temperature 37.8 C    Collection site ARTERIAL LINE    Drawn by Nurse    Sample type ARTERIAL   Glucose, capillary     Status: Abnormal   Collection Time: 06/10/17 11:02 AM  Result Value Ref Range   Glucose-Capillary 134 (H) 65 - 99 mg/dL   Comment 1 Arterial Specimen   Glucose, capillary     Status: Abnormal   Collection Time: 06/10/17 12:11 PM  Result Value Ref Range   Glucose-Capillary 130 (H) 65 - 99 mg/dL   Comment 1 Capillary Specimen    Comment 2 Notify RN   Glucose, capillary     Status: Abnormal   Collection Time: 06/10/17  3:55 PM  Result Value Ref Range   Glucose-Capillary 184 (H) 65 - 99 mg/dL   Comment 1 Arterial Specimen    Comment 2 Notify RN   Magnesium     Status: Abnormal   Collection Time: 06/10/17  4:15 PM  Result Value Ref Range   Magnesium 2.7 (H) 1.7 - 2.4 mg/dL  CBC     Status: Abnormal   Collection Time: 06/10/17  4:15 PM  Result Value Ref Range   WBC 27.2 (H) 4.0 - 10.5 K/uL    Comment: REPEATED TO VERIFY   RBC 3.16 (L) 4.22 - 5.81 MIL/uL   Hemoglobin 9.0 (L) 13.0 - 17.0 g/dL   HCT 27.7 (L) 39.0 - 52.0 %   MCV 87.7 78.0 - 100.0 fL   MCH 28.5 26.0 - 34.0 pg   MCHC 32.5 30.0 - 36.0 g/dL   RDW 15.8 (H) 11.5 - 15.5 %   Platelets 193 150 - 400 K/uL  Creatinine, serum     Status: Abnormal   Collection Time: 06/10/17  4:15 PM  Result  Value Ref Range   Creatinine, Ser 2.30 (H) 0.61 - 1.24 mg/dL   GFR calc non Af Amer 27 (L) >60 mL/min   GFR calc Af Amer 31 (L) >60 mL/min    Comment: (NOTE) The eGFR has been calculated using the CKD EPI equation. This calculation has not been validated in all clinical situations. eGFR's persistently <60 mL/min signify possible Chronic Kidney Disease.   I-STAT, chem 8     Status: Abnormal   Collection Time: 06/10/17  4:26 PM  Result Value Ref Range   Sodium 133 (L) 135 - 145 mmol/L   Potassium 4.8 3.5 - 5.1 mmol/L   Chloride 100 (L) 101 - 111 mmol/L   BUN 24 (H) 6 - 20 mg/dL   Creatinine, Ser 2.30 (H) 0.61 - 1.24 mg/dL   Glucose, Bld 203 (H) 65 - 99 mg/dL   Calcium, Ion 1.06 (L) 1.15 - 1.40 mmol/L   TCO2 19 0 - 100 mmol/L   Hemoglobin 9.5 (L) 13.0 - 17.0 g/dL   HCT 28.0 (L) 39.0 - 52.0 %  Glucose, capillary     Status: Abnormal   Collection Time: 06/10/17  7:46 PM  Result Value Ref Range   Glucose-Capillary 251 (H) 65 - 99 mg/dL  Glucose, capillary     Status: Abnormal   Collection Time: 06/10/17 11:16 PM  Result Value Ref Range   Glucose-Capillary 298 (H) 65 - 99 mg/dL   Comment 1 Capillary Specimen    Comment 2 Notify RN   Glucose, capillary     Status: Abnormal   Collection Time: 06/11/17  3:37 AM  Result Value Ref Range   Glucose-Capillary 335 (H) 65 - 99 mg/dL  Basic metabolic panel  Status: Abnormal   Collection Time: 06/11/17  5:00 AM  Result Value Ref Range   Sodium 128 (L) 135 - 145 mmol/L   Potassium 4.8 3.5 - 5.1 mmol/L   Chloride 99 (L) 101 - 111 mmol/L   CO2 19 (L) 22 - 32 mmol/L   Glucose, Bld 329 (H) 65 - 99 mg/dL   BUN 29 (H) 6 - 20 mg/dL   Creatinine, Ser 3.06 (H) 0.61 - 1.24 mg/dL   Calcium 7.4 (L) 8.9 - 10.3 mg/dL   GFR calc non Af Amer 19 (L) >60 mL/min   GFR calc Af Amer 22 (L) >60 mL/min    Comment: (NOTE) The eGFR has been calculated using the CKD EPI equation. This calculation has not been validated in all clinical situations. eGFR's  persistently <60 mL/min signify possible Chronic Kidney Disease.    Anion gap 10 5 - 15  CBC     Status: Abnormal   Collection Time: 06/11/17  5:00 AM  Result Value Ref Range   WBC 30.3 (H) 4.0 - 10.5 K/uL    Comment: REPEATED TO VERIFY   RBC 2.97 (L) 4.22 - 5.81 MIL/uL   Hemoglobin 8.5 (L) 13.0 - 17.0 g/dL    Comment: REPEATED TO VERIFY   HCT 26.0 (L) 39.0 - 52.0 %   MCV 87.5 78.0 - 100.0 fL   MCH 28.6 26.0 - 34.0 pg   MCHC 32.7 30.0 - 36.0 g/dL   RDW 15.5 11.5 - 15.5 %   Platelets 191 150 - 400 K/uL    Comment: CONSISTENT WITH PREVIOUS RESULT  Glucose, capillary     Status: Abnormal   Collection Time: 06/11/17  8:05 AM  Result Value Ref Range   Glucose-Capillary 317 (H) 65 - 99 mg/dL   Comment 1 Notify RN   I-STAT 3, arterial blood gas (G3+)     Status: Abnormal   Collection Time: 06/11/17  8:07 AM  Result Value Ref Range   pH, Arterial 7.249 (L) 7.350 - 7.450   pCO2 arterial 44.8 32.0 - 48.0 mmHg   pO2, Arterial 72.0 (L) 83.0 - 108.0 mmHg   Bicarbonate 19.6 (L) 20.0 - 28.0 mmol/L   TCO2 21 0 - 100 mmol/L   O2 Saturation 91.0 %   Acid-base deficit 7.0 (H) 0.0 - 2.0 mmol/L   Patient temperature 98.6 F    Collection site ARTERIAL LINE    Drawn by RT    Sample type ARTERIAL   Glucose, capillary     Status: Abnormal   Collection Time: 06/11/17  9:42 AM  Result Value Ref Range   Glucose-Capillary 340 (H) 65 - 99 mg/dL  I-STAT 3, arterial blood gas (G3+)     Status: Abnormal   Collection Time: 06/11/17  9:49 AM  Result Value Ref Range   pH, Arterial 7.315 (L) 7.350 - 7.450   pCO2 arterial 41.5 32.0 - 48.0 mmHg   pO2, Arterial 390.0 (H) 83.0 - 108.0 mmHg   Bicarbonate 21.1 20.0 - 28.0 mmol/L   TCO2 22 0 - 100 mmol/L   O2 Saturation 100.0 %   Acid-base deficit 5.0 (H) 0.0 - 2.0 mmol/L   Patient temperature 98.6 F    Collection site ARTERIAL LINE    Drawn by RT    Sample type ARTERIAL   Glucose, capillary     Status: Abnormal   Collection Time: 06/11/17 11:21 AM   Result Value Ref Range   Glucose-Capillary 361 (H) 65 - 99 mg/dL   Comment  1 Arterial Specimen   Glucose, capillary     Status: Abnormal   Collection Time: 06/11/17 11:58 AM  Result Value Ref Range   Glucose-Capillary 351 (H) 65 - 99 mg/dL   Comment 1 Notify RN   .Cooxemetry Panel (carboxy, met, total hgb, O2 sat)     Status: Abnormal   Collection Time: 06/11/17 12:05 PM  Result Value Ref Range   Total hemoglobin 9.6 (L) 12.0 - 16.0 g/dL   O2 Saturation 58.7 %   Carboxyhemoglobin 0.9 0.5 - 1.5 %   Methemoglobin 1.3 0.0 - 1.5 %  Glucose, capillary     Status: Abnormal   Collection Time: 06/11/17 12:59 PM  Result Value Ref Range   Glucose-Capillary 332 (H) 65 - 99 mg/dL     ROS:  Review of systems not obtained due to patient factors.  Physical Exam: Vitals:   06/11/17 1155 06/11/17 1200  BP:  (!) 123/36  Pulse:  80  Resp:  (!) 8  Temp: (!) 96.7 F (35.9 C) 97.9 F (36.6 C)     General: Sedated, intubated HEENT: Pupils are equally round and reactive to light, extra motions are intact, mucous membrane moist Neck: Difficult to tell JVD Heart: Regular rate and rhythm Lungs: Coarse breath sounds bilaterally Abdomen: Obese, soft, nontender Extremities: Pitting edema throughout Skin: Warm and dry Neuro: Sedated on vent  Assessment/Plan: 76 year old male with diabetes status post recent MI and postop day 2 from CABG with acute on chronic renal failure 1.Renal- acute on chronic renal failure. Urinalysis previously bland given the scenario is likely ATN from hemodynamic cause. There are no absolute indications for dialysis at this time. Urine output is down on moderate dose Lasix so will increase dose to challenge-  2. Hypertension/volume  - getting volume overloaded secondary to decreased urine output. We'll attempt to challenge with Lasix but will also try to minimize inputs 3. Elytes- potassium okay, sodium down likely secondary to volume 4. Anemia  - situational secondary  to surgery- supportive care and transfuse as needed   Standley Bargo A 06/11/2017, 1:50 PM

## 2017-06-11 NOTE — Progress Notes (Signed)
PHARMACY NOTE:  ANTIMICROBIAL RENAL DOSAGE ADJUSTMENT  Current antimicrobial regimen includes a mismatch between antimicrobial dosage and estimated renal function.  As per policy approved by the Pharmacy & Therapeutics and Medical Executive Committees, the antimicrobial dosage will be adjusted accordingly.  Current antimicrobial dosage:  Ceftazidime 1g IV every 8 hours  Indication: PNA  Renal Function:  Estimated Creatinine Clearance: 27.9 mL/min (A) (by C-G formula based on SCr of 3.06 mg/dL (H)). []      On intermittent HD, scheduled: []      On CRRT    Antimicrobial dosage has been changed to:  Ceftazidime 2g IV every 24 hours  Additional comments:   Thank you for allowing pharmacy to be a part of this patient's care.  Rolley SimsMartin, Ilian Wessell Ann, Western State HospitalRPH 06/11/2017 9:02 AM

## 2017-06-12 ENCOUNTER — Inpatient Hospital Stay (HOSPITAL_COMMUNITY): Payer: Medicare Other

## 2017-06-12 ENCOUNTER — Encounter (HOSPITAL_COMMUNITY): Payer: Self-pay | Admitting: Anesthesiology

## 2017-06-12 DIAGNOSIS — E669 Obesity, unspecified: Secondary | ICD-10-CM

## 2017-06-12 DIAGNOSIS — E1142 Type 2 diabetes mellitus with diabetic polyneuropathy: Secondary | ICD-10-CM

## 2017-06-12 DIAGNOSIS — Z951 Presence of aortocoronary bypass graft: Secondary | ICD-10-CM

## 2017-06-12 LAB — GLUCOSE, CAPILLARY
GLUCOSE-CAPILLARY: 250 mg/dL — AB (ref 65–99)
GLUCOSE-CAPILLARY: 101 mg/dL — AB (ref 65–99)
GLUCOSE-CAPILLARY: 101 mg/dL — AB (ref 65–99)
GLUCOSE-CAPILLARY: 101 mg/dL — AB (ref 65–99)
GLUCOSE-CAPILLARY: 115 mg/dL — AB (ref 65–99)
GLUCOSE-CAPILLARY: 85 mg/dL (ref 65–99)
GLUCOSE-CAPILLARY: 99 mg/dL (ref 65–99)
GLUCOSE-CAPILLARY: 101 mg/dL — AB (ref 65–99)
GLUCOSE-CAPILLARY: 130 mg/dL — AB (ref 65–99)
GLUCOSE-CAPILLARY: 115 mg/dL — AB (ref 65–99)
GLUCOSE-CAPILLARY: 125 mg/dL — AB (ref 65–99)
GLUCOSE-CAPILLARY: 127 mg/dL — AB (ref 65–99)
Glucose-Capillary: 103 mg/dL — ABNORMAL HIGH (ref 65–99)
Glucose-Capillary: 110 mg/dL — ABNORMAL HIGH (ref 65–99)
Glucose-Capillary: 115 mg/dL — ABNORMAL HIGH (ref 65–99)
Glucose-Capillary: 118 mg/dL — ABNORMAL HIGH (ref 65–99)
Glucose-Capillary: 126 mg/dL — ABNORMAL HIGH (ref 65–99)
Glucose-Capillary: 127 mg/dL — ABNORMAL HIGH (ref 65–99)
Glucose-Capillary: 134 mg/dL — ABNORMAL HIGH (ref 65–99)
Glucose-Capillary: 143 mg/dL — ABNORMAL HIGH (ref 65–99)
Glucose-Capillary: 149 mg/dL — ABNORMAL HIGH (ref 65–99)
Glucose-Capillary: 168 mg/dL — ABNORMAL HIGH (ref 65–99)
Glucose-Capillary: 97 mg/dL (ref 65–99)
Glucose-Capillary: 97 mg/dL (ref 65–99)

## 2017-06-12 LAB — COMPREHENSIVE METABOLIC PANEL
ALT: 27 U/L (ref 17–63)
ALT: 25 U/L (ref 17–63)
AST: 43 U/L — ABNORMAL HIGH (ref 15–41)
AST: 38 U/L (ref 15–41)
Albumin: 2.2 g/dL — ABNORMAL LOW (ref 3.5–5.0)
Albumin: 2.1 g/dL — ABNORMAL LOW (ref 3.5–5.0)
Alkaline Phosphatase: 62 U/L (ref 38–126)
Alkaline Phosphatase: 58 U/L (ref 38–126)
Anion gap: 11 (ref 5–15)
Anion gap: 9 (ref 5–15)
BUN: 37 mg/dL — ABNORMAL HIGH (ref 6–20)
BUN: 38 mg/dL — ABNORMAL HIGH (ref 6–20)
CO2: 19 mmol/L — ABNORMAL LOW (ref 22–32)
CO2: 23 mmol/L (ref 22–32)
Calcium: 7.1 mg/dL — ABNORMAL LOW (ref 8.9–10.3)
Calcium: 7.2 mg/dL — ABNORMAL LOW (ref 8.9–10.3)
Chloride: 100 mmol/L — ABNORMAL LOW (ref 101–111)
Chloride: 98 mmol/L — ABNORMAL LOW (ref 101–111)
Creatinine, Ser: 3.86 mg/dL — ABNORMAL HIGH (ref 0.61–1.24)
Creatinine, Ser: 4.15 mg/dL — ABNORMAL HIGH (ref 0.61–1.24)
GFR calc Af Amer: 16 mL/min — ABNORMAL LOW (ref >60)
GFR calc Af Amer: 15 mL/min — ABNORMAL LOW (ref >60)
GFR calc non Af Amer: 14 mL/min — ABNORMAL LOW (ref >60)
GFR calc non Af Amer: 13 mL/min — ABNORMAL LOW (ref >60)
Glucose, Bld: 108 mg/dL — ABNORMAL HIGH (ref 65–99)
Glucose, Bld: 115 mg/dL — ABNORMAL HIGH (ref 65–99)
Potassium: 4.2 mmol/L (ref 3.5–5.1)
Potassium: 4.1 mmol/L (ref 3.5–5.1)
Sodium: 130 mmol/L — ABNORMAL LOW (ref 135–145)
Sodium: 130 mmol/L — ABNORMAL LOW (ref 135–145)
Total Bilirubin: 0.8 mg/dL (ref 0.3–1.2)
Total Bilirubin: 1 mg/dL (ref 0.3–1.2)
Total Protein: 5.1 g/dL — ABNORMAL LOW (ref 6.5–8.1)
Total Protein: 4.6 g/dL — ABNORMAL LOW (ref 6.5–8.1)

## 2017-06-12 LAB — BPAM RBC
Blood Product Expiration Date: 201808292359
Blood Product Expiration Date: 201808292359
ISSUE DATE / TIME: 201807291249
ISSUE DATE / TIME: 201807291249
Unit Type and Rh: 5100
Unit Type and Rh: 5100

## 2017-06-12 LAB — COOXEMETRY PANEL
Carboxyhemoglobin: 1.4 % (ref 0.5–1.5)
Carboxyhemoglobin: 0.8 % (ref 0.5–1.5)
Methemoglobin: 0.8 % (ref 0.0–1.5)
Methemoglobin: 1.3 % (ref 0.0–1.5)
O2 Saturation: 60.3 %
O2 Saturation: 55.9 %
Total hemoglobin: 7.8 g/dL — ABNORMAL LOW (ref 12.0–16.0)
Total hemoglobin: 10.7 g/dL — ABNORMAL LOW (ref 12.0–16.0)

## 2017-06-12 LAB — BLOOD GAS, ARTERIAL
Acid-base deficit: 3.6 mmol/L — ABNORMAL HIGH (ref 0.0–2.0)
Bicarbonate: 21 mmol/L (ref 20.0–28.0)
Drawn by: 511471
FIO2: 40
MECHVT: 550 mL
O2 Saturation: 96.6 %
PEEP: 5 cmH2O
Patient temperature: 98.6
RATE: 16 resp/min
pCO2 arterial: 38.9 mmHg (ref 32.0–48.0)
pH, Arterial: 7.352 (ref 7.350–7.450)
pO2, Arterial: 82.4 mmHg — ABNORMAL LOW (ref 83.0–108.0)

## 2017-06-12 LAB — TYPE AND SCREEN
ABO/RH(D): O POS
Antibody Screen: NEGATIVE
Unit division: 0
Unit division: 0

## 2017-06-12 LAB — CBC
HCT: 24.1 % — ABNORMAL LOW (ref 39.0–52.0)
Hemoglobin: 8.1 g/dL — ABNORMAL LOW (ref 13.0–17.0)
MCH: 28.6 pg (ref 26.0–34.0)
MCHC: 33.6 g/dL (ref 30.0–36.0)
MCV: 85.2 fL (ref 78.0–100.0)
Platelets: 180 10*3/uL (ref 150–400)
RBC: 2.83 MIL/uL — ABNORMAL LOW (ref 4.22–5.81)
RDW: 15.4 % (ref 11.5–15.5)
WBC: 23.8 10*3/uL — ABNORMAL HIGH (ref 4.0–10.5)

## 2017-06-12 LAB — LACTIC ACID, PLASMA
Lactic Acid, Venous: 2.5 mmol/L (ref 0.5–1.9)
Lactic Acid, Venous: 1.4 mmol/L (ref 0.5–1.9)

## 2017-06-12 LAB — POCT I-STAT 3, ART BLOOD GAS (G3+)
ACID-BASE DEFICIT: 5 mmol/L — AB (ref 0.0–2.0)
Acid-base deficit: 6 mmol/L — ABNORMAL HIGH (ref 0.0–2.0)
Bicarbonate: 20 mmol/L (ref 20.0–28.0)
Bicarbonate: 20.5 mmol/L (ref 20.0–28.0)
O2 SAT: 93 %
O2 Saturation: 95 %
PH ART: 7.335 — AB (ref 7.350–7.450)
PO2 ART: 73 mmHg — AB (ref 83.0–108.0)
Patient temperature: 98.3
TCO2: 21 mmol/L (ref 0–100)
TCO2: 22 mmol/L (ref 0–100)
pCO2 arterial: 38.7 mmHg (ref 32.0–48.0)
pCO2 arterial: 38.4 mmHg (ref 32.0–48.0)
pH, Arterial: 7.32 — ABNORMAL LOW (ref 7.350–7.450)
pO2, Arterial: 81 mmHg — ABNORMAL LOW (ref 83.0–108.0)

## 2017-06-12 LAB — PREPARE RBC (CROSSMATCH)

## 2017-06-12 LAB — MAGNESIUM: Magnesium: 2.5 mg/dL — ABNORMAL HIGH (ref 1.7–2.4)

## 2017-06-12 LAB — PHOSPHORUS: Phosphorus: 3.6 mg/dL (ref 2.5–4.6)

## 2017-06-12 MED ORDER — SODIUM BICARBONATE 8.4 % IV SOLN
50.0000 meq | Freq: Once | INTRAVENOUS | Status: AC
  Administered 2017-06-12: 50 meq via INTRAVENOUS
  Filled 2017-06-12: qty 50

## 2017-06-12 MED ORDER — SODIUM CHLORIDE 0.9% FLUSH: 10.0000 mL | INTRAVENOUS | Status: DC | PRN

## 2017-06-12 MED ORDER — SODIUM CHLORIDE 0.9% FLUSH
10.0000 mL | Freq: Two times a day (BID) | INTRAVENOUS | Status: DC
  Administered 2017-06-14 – 2017-06-19 (×8): 10 mL
  Administered 2017-06-20: 20 mL
  Administered 2017-06-20 – 2017-06-21 (×2): 10 mL
  Administered 2017-06-21: 20 mL
  Administered 2017-06-22 – 2017-06-23 (×4): 10 mL
  Administered 2017-06-24: 40 mL

## 2017-06-12 MED ORDER — ALBUMIN HUMAN 25 % IV SOLN
12.5000 g | Freq: Four times a day (QID) | INTRAVENOUS | Status: DC
  Administered 2017-06-12 – 2017-06-21 (×36): 12.5 g via INTRAVENOUS
  Filled 2017-06-12 (×37): qty 50

## 2017-06-12 MED ORDER — CHLORHEXIDINE GLUCONATE CLOTH 2 % EX PADS
6.0000 | MEDICATED_PAD | Freq: Every day | CUTANEOUS | Status: DC
  Administered 2017-06-13: 6 via TOPICAL

## 2017-06-12 MED ORDER — CHLORHEXIDINE GLUCONATE CLOTH 2 % EX PADS: 6.0000 | MEDICATED_PAD | Freq: Every day | CUTANEOUS | Status: DC

## 2017-06-12 MED ORDER — FUROSEMIDE 10 MG/ML IJ SOLN
160.0000 mg | Freq: Four times a day (QID) | INTRAVENOUS | Status: DC
  Administered 2017-06-12 – 2017-06-13 (×4): 160 mg via INTRAVENOUS
  Filled 2017-06-12 (×5): qty 16

## 2017-06-12 MED ORDER — CHLORHEXIDINE GLUCONATE 0.12 % MT SOLN
OROMUCOSAL | Status: AC
  Filled 2017-06-12: qty 15

## 2017-06-12 MED ORDER — ENOXAPARIN SODIUM 30 MG/0.3ML ~~LOC~~ SOLN
30.0000 mg | Freq: Every day | SUBCUTANEOUS | Status: DC
  Administered 2017-06-12 – 2017-06-18 (×7): 30 mg via SUBCUTANEOUS
  Filled 2017-06-12 (×8): qty 0.3

## 2017-06-12 NOTE — Progress Notes (Addendum)
[facility]   Name: Philip DutchRobert Love MRN: [id number] DOB: 1941-10-19    ADMISSION DATE:  06/11/2017 CONSULTATION DATE:  06/11/2017  REFERRING MD:  Morton PetersVan Tright  CHIEF COMPLAINT:  Respiratory Decompensation/ vent management  Brief Patient Summary:   76 year old obese former smoker with OSA , and insulin-dependent diabetes [place] [facility] [place] [place] He presented to the emergency department there with 2 weeks of progressive shortness of breath ankle swelling and chest discomfort. He had nonspecific EKG changes but was in sinus rhythm. Cardiac enzymes were mildly positive and chest x-ray showed mild CHF. He was transferred to this hospital. His creatinine was 2. Echocardiogram showed EF of 45%. No significant MR. Creatinine improved to 1.7 and he underwent coronary angiography and right heart cath. Coronary showed a 90% left main stenosis, proximal 80-90% LAD stenosis, 90% stenosis of the mid RCA and 80% stenosis of the proximal circumflex. LVEDP is 12. Right heart cath showed cardiac output of 5.5 L/m with normal right-sided pressures. The patient was admitted to the ICU placed on IV heparin and nitroglycerin. His creatinine post cath remains at 1.7.  Blood sugars are fairly well controlled.Patient underwent CABG x 3 with TEE using Left internal mammary artery and right saphenous leg vein on  05/25/2017. He was recovering in the CVICU, was extubated 06/10/2017. He refused CPAP the night of 7/30-7/31> He was in atrial fibrillation and when bolused with Amio he had a rhythm change with long pauses and significant change in mental status requiring emergent re-intubation per Dr. Delton CoombesByrum 7/31. ABG showed a metabolic acidosis.( 7.311/ 41.5/390/22/ 21.1)  CCM consulted for vent management and evaluation of respiratory decompensation.   SUBJECTIVE:  Aline out with bath. Self extubated this am.  Now on VM at 40%, sats 97% Remains on dopamine 3mcg, levophed 25mcg,  amio, milrinone 0.25 mcg/kg/min, insulin gtts  VITAL SIGNS: BP (!) 106/92   Pulse 85   Temp 98.2 F (36.8 C)   Resp (!) 28   Ht 5\' 8"  (1.727 m)   Wt 286 lb 13.1 oz (130.1 kg)   SpO2 98%   BMI 43.61 kg/m   HEMODYNAMICS: PAP: (33-58)/(13-29) 53/20 CO:  [5.9 L/min-6.6 L/min] 6.6 L/min CI:  [2.6 L/min/m2-2.9 L/min/m2] 2.9 L/min/m2  VENTILATOR SETTINGS: Vent Mode: PRVC FiO2 (%):  [40 %-100 %] 40 % Set Rate:  [16 bmp-18 bmp] 16 bmp Vt Set:  [550 mL] 550 mL PEEP:  [5 cmH20] 5 cmH20 Plateau Pressure:  [18 cmH20-35 cmH20] 30 cmH20  INTAKE / OUTPUT: I/O last 3 completed shifts: In: 5828.5 [I.V.:5612.5; IV Piggyback:216] Out: 850 [Urine:340; Chest Tube:510]  PHYSICAL EXAMINATION: General:  Adult male sitting in bed in NAD HEENT: MM pink/moist, PERRL, thick neck Neuro: Sleepy, but awakens, oriented to person, place, birthdate, follows comands, MAE CV: rrr- not currently paced, distant PULM: even/non-labored, lungs bilaterally coarse, scattered rhonchi, diminished bases, +cough, CT  GI: obese,soft, non-tender, bs hypoactive  Extremities: warm/dry, 3+ edema  Skin: no rashes or lesions, midline chest dsg CDI, external pacer wires   LABS:  BMET  Recent Labs Lab 05/25/2017 0600  06/10/17 0602  06/10/17 1626 06/11/17 0500 06/11/17 1901  NA 133*  < > 133*  --  133* 128* 130*  K 4.2  < > 4.1  --  4.8 4.8 4.0  CL 100*  < > 104  --  100* 99* 97*  CO2 26  --  22  --   --  19*  --   BUN  26*  < > 22*  --  24* 29* 31*  CREATININE 1.49*  < > 1.78*  < > 2.30* 3.06* 3.90*  GLUCOSE 98  < > 108*  --  203* 329* 242*  < > = values in this interval not displayed.  Electrolytes  Recent Labs Lab 05/21/2017 0600 06/10/2017 2102 06/10/17 0602 06/10/17 1615 06/11/17 0500  CALCIUM 8.2*  --  7.3*  --  7.4*  MG  --  3.2* 2.5* 2.7*  --     CBC  Recent Labs Lab 06/10/17 0602 06/10/17 1615 06/10/17 1626 06/11/17 0500 06/11/17 1901  WBC 18.2* 27.2*  --  30.3*  --   HGB 9.0* 9.0*  9.5* 8.5* 9.2*  HCT 26.6* 27.7* 28.0* 26.0* 27.0*  PLT 180 193  --  191  --     Coag's  Recent Labs Lab 05/31/2017 1723 06/08/17 0725 06/08/17 1304 05/17/2017 1516  APTT 37*  --  185* 42*  INR 1.10 1.18 1.20 1.47    Sepsis Markers No results for input(s): LATICACIDVEN, PROCALCITON, O2SATVEN in the last 168 hours.  ABG  Recent Labs Lab 06/11/17 1428 06/11/17 1944 06/12/17 0150  PHART 7.332* 7.347* 7.352  PCO2ART 37.2 37.1 38.9  PO2ART 122.0* 81.0* 82.4*    Liver Enzymes  Recent Labs Lab 06/08/17 0725  AST 27  ALT 14*  ALKPHOS 46  BILITOT 0.5  ALBUMIN 3.2*    Cardiac Enzymes  Recent Labs Lab 06/02/2017 2351 06/06/17 0526 06/06/17 1120  TROPONINI 1.49* 1.46* 1.16*    Glucose  Recent Labs Lab 06/12/17 0106 06/12/17 0159 06/12/17 0259 06/12/17 0403 06/12/17 0456 06/12/17 0741  GLUCAP 143* 126* 118* 115* 101* 97    Imaging Dg Chest Port 1 View  Result Date: 06/12/2017 CLINICAL DATA:  Respiratory failure EXAM: PORTABLE CHEST 1 VIEW COMPARISON:  06/11/2017 FINDINGS: Endotracheal and NG tubes are stable. Chest tube and mediastinal tubes are stable. Swan-Ganz catheter is stable via the right jugular vein and its tip in the lower pulmonary artery. The heart remains prominent. Lungs are under aerated. Increasing vascular congestion and suspected edema have developed. Postoperative changes from CABG are again noted. There is no pneumothorax. IMPRESSION: No pneumothorax. Increasing vascular congestion and interstitial edema. Electronically Signed   By: Jolaine ClickArthur  Hoss M.D.   On: 06/12/2017 07:35   Dg Chest Port 1 View  Result Date: 06/11/2017 CLINICAL DATA:  ET tube placement EXAM: PORTABLE CHEST 1 VIEW COMPARISON:  06/11/2017 FINDINGS: Endotracheal tube is 3.5 cm above the carina. Changes of CABG. Interval placement of NG tube in the stomach. Swan-Ganz catheter tip is in the main pulmonary artery. Left chest tube is in place without pneumothorax. Cardiomegaly with  vascular congestion and mild interstitial edema, improved since prior study. IMPRESSION: Improving interstitial edema pattern. Endotracheal tube is 3.5 cm above the carina. No pneumothorax. Electronically Signed   By: Charlett NoseKevin  Dover M.D.   On: 06/11/2017 10:16    STUDIES:  Echo 06/06/2017>>EF 40-45%, Moderate LVH, Mildly to moderately  reduced systolic dysfunction, MV mild regurgitation, LA mildly dilated 7/27 LHC/RHC >>90% left main stenosis, proximal 80-90% LAD stenosis, 90% stenosis of the mid RCA and 80% stenosis of the proximal circumflex. LVEDP is 12, cardiac output of 5.5 L/m with normal right-sided pressures.  CULTURES: 7/27 MRSA PCR >> positive Sputum 7/31>>  ANTIBIOTICS: Ceftazidine 7/31>> Zinacef 7/29 preop  SIGNIFICANT EVENTS: 05/13/2017>> CABG 06/10/2017>> Extubation 06/11/2017>> Emergent re-intubation for mixed respiratory/metabolic acidosis 06/12/2017 > self extubated  LINES/TUBES: R IJ Swan 7/29 >> Left and  Mediastinal CT>> 7/29 R Radial A Line 7/29 >>8/1 ETT 7/31 >> 8/1  DISCUSSION: Pt. Post CABG 7/29, extubated 7/30, developed mixed respiratory / metabolic acidosis 7/31 am after refusing CPAP pm of 7/30, with suspected hypoperfusion 2/2 artrial fibrillation/  rhythm change ( blocked down with Amio bolus). Additionally worsening renal function is a contributing factor. Dr. Delton Coombes emergently intubated the patient 7/31. ABG is improving on mechanical ventilation. Rhythm is now intermittently paced. We will check lactic acid and  rest on vent 7/31 and re-evaluate in am regarding readiness to wean/ extubate after ABG and trending lactates.PCCM to follow.   ASSESSMENT / PLAN:  PULMONARY A: Acute hypoxic respiratory failure Mixed Respiratory/Metabolic Acidosis ( Hypo perfusion. 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 Remains on Amio, Milrinone, dopamine and Levo P: Telemetry monitoring  Maintain MAP > 65 to ensure adequate end organ perfusion Management per CVTS CT management per CVTS  RENAL A:   AKI/ ATN w/oliguria- sCr 1.43 on admit CKD NAGMA Hypermag P:   AM labs pending Renal following-  No indications for CRRT right now Trend BMP/mag/phos / urinary output Replace electrolytes as indicated Avoid nephrotoxic agents, ensure adequate renal perfusion Strict I&O  GASTROINTESTINAL A:   No Acute Issues  BS diminished/ absent P:   WUJ:[id number] NPO Bowel regimen   HEMATOLOGIC A:   Anemia P:  Trend CBC Transfuse for HGB <8 DVT prophylaxis:SCD's Observe for obvious signs of bleeding/ Monitor chest tube drainage  INFECTIOUS A:   ? 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  - Inter-disciplinary family meet or Palliative Care meeting due by: 07/07/2017.   CCT 40 mins  Posey Boyer, AGACNP-BC [facility] Pgr: [phone] or if no answer [phone] 06/12/2017, 8:47 AM  Attending Note:  I have examined patient, reviewed labs, studies and notes. I have discussed the case with B Simpson, and I agree with the data and plans as amended above. 76 year old obese man with obstructive sleep apnea, diabetes, coronary disease. He underwent an urgent CABG on 7/29 and was extubated 7/30. He was reintubated on  7/31 with altered mental status and an agonal respiratory pattern. It was felt that this was likely related to inability to compensate for an evolving metabolic acidosis, failure to use his CPAP, possibly rhythm disturbance after he received amiodarone for atrial fibrillation. He self extubated this morning. On my evaluation he is somnolent but will wake up and answer questions, poorly oriented but does know his name and situation. Very distant breath sounds with decreased air movement at both bases. Some coarse upper airway noise and secretions. Heart is regular, intermittently paced, rate in the 80s. Abdomen obese, soft, benign. He remains on pressors, dopamine, milrinone  for volume removal. He appears to be at high risk for recurrent failure and reintubation. We will try to temporize with BiPAP if he will tolerate. Continue diuresis as able. Aggressive diabetes control. He was started on empiric ceftazidime 7/31 for possible HCAP, suspect that this is instead pulmonary edema and atelectasis. Narrow soon as able. Independent critical care time is 36 minutes.   Levy Pupa, MD, PhD 06/12/2017, 2:28 PM  Pulmonary and Critical Care [phone] or if no answer [phone]

## 2017-06-12 NOTE — Progress Notes (Signed)
Patient ID: Philip Love, male   DOB: [birth year], 76 y.o.   MRN: [id number] EVENING ROUNDS NOTE :     [address]       [place] [address]             [phone]                 3 Days Post-Op Procedure(s) (LRB): CORONARY ARTERY BYPASS GRAFTING (CABG) x3 WITH TEE USING LEFT INTERNAL MAMMARY ARTERY AND RIGHT  SAPHENOUS LEG VEIN HARVESTED ENDOSCOPICALLY (N/A)  Total Length of Stay:  LOS: 7 days  BP (!) 97/27   Pulse (!) 26   Temp 98.8 F (37.1 C)   Resp 19   Ht 5' 8"  (1.727 m)   Wt 286 lb 13.1 oz (130.1 kg)   SpO2 93%   BMI 43.61 kg/m   .Intake/Output      07/31 0701 - 08/01 0700 08/01 0701 - 08/02 0700   I.V. (mL/kg) 4268.2 (32.8) 821.2 (6.3)   IV Piggyback 116 66   Total Intake(mL/kg) 4384.2 (33.7) 887.2 (6.8)   Urine (mL/kg/hr) 190 (0.1) 110 (0.1)   Chest Tube 400 110   Total Output 590 220   Net +3794.2 +667.2          . sodium chloride 10 mL/hr at 06/12/17 1500  . sodium chloride Stopped (06/10/17 0700)  . sodium chloride Stopped (06/10/17 0700)  . sodium chloride 10 mL/hr at 06/12/17 1500  . albumin human    . amiodarone 30 mg/hr (06/12/17 1500)  . cefTAZidime (FORTAZ)  IV Stopped (06/12/17 1156)  . DOPamine 3 mcg/kg/min (06/12/17 1500)  . furosemide Stopped (06/12/17 1552)  . insulin (NOVOLIN-R) infusion 3.6 Units/hr (06/12/17 1623)  . lactated ringers    . lactated ringers 10 mL/hr at 06/12/17 1500  . milrinone 0.25 mcg/kg/min (06/12/17 1500)  . norepinephrine (LEVOPHED) Adult infusion 28 mcg/min (06/12/17 1655)     Lab Results  Component Value Date   WBC 23.8 (H) 06/12/2017   HGB 8.1 (L) 06/12/2017   HCT 24.1 (L) 06/12/2017   PLT 180 06/12/2017   GLUCOSE 115 (H) 06/12/2017   CHOL 125 06/08/2017   TRIG 173 (H) 06/08/2017   HDL 34 (L) 06/08/2017   LDLCALC 56 06/08/2017   ALT 25 06/12/2017   AST 38 06/12/2017   NA 130 (L) 06/12/2017   K 4.1 06/12/2017   CL 98 (L) 06/12/2017   CREATININE 4.15 (H) 06/12/2017   BUN 38 (H)  06/12/2017   CO2 23 06/12/2017   TSH 3.808 06/08/2017   INR 1.47 06/10/2017   HGBA1C 6.3 (H) 06/08/2017   On dopamine 4, levophed 27, milrinone 0.25 Acute renal failure, cr preop 1.72    Self extubation this am, now on bipap Acute Kidney Injury (any one) stage 3   Increase in SCr by > 0.3 within 48 hours  Increase SCr to > 1.5 times baseline  Urine volume < 0.5 ml/kg/h for 6 hrs  ?Stage 1 - Increase in serum creatinine to 1.5 to 1.9 times baseline, or increase in serum creatinine by ?0.3 mg/dL (?26.5 micromol/L), or reduction in urine output to <0.5 mL/kg per hour for 6 to 12 hours.  ?Stage 2 - Increase in serum creatinine to 2.0 to 2.9 times baseline, or reduction in urine output to <0.5 mL/kg per hour for ?12 hours.  ?Stage 3 - Increase in serum creatinine to 3.0 times baseline, or increase in serum creatinine to ?4.0 mg/dL (?353.6 micromol/L), or reduction in  urine output to <0.3 mL/kg per hour for ?24 hours, or anuria for ?12 hours, or the initiation of renal replacement therapy, or, in patients <18 years, decrease in eGFR to <35 mL   Lab Results  Component Value Date   CREATININE 4.15 (H) 06/12/2017   Estimated Creatinine Clearance: 20.9 mL/min (A) (by C-G formula based on SCr of 4.15 mg/dL (H)).       Grace Isaac MD  Beeper [phone] Office [phone] 06/12/2017 5:17 PM

## 2017-06-12 NOTE — Progress Notes (Signed)
RN tried collecting labs this AM and patient's a. Line was not pulling back blood. RT came to look at A. Line and it was redressed. The patient's A. Line still did not work.  RN notified E-Link MD, and orders were given for RT to insert another A. Line.   RN tried collecting labs from other access the patient had, but nothing would draw back blood.   RT currently attempting to get another A. Line.   Trishia Cuthrell E Mylinda LatinaNino Combs, [place]

## 2017-06-12 NOTE — Progress Notes (Signed)
RT called by RN due to patient self-extubating. Patient currently on venturi mask at 40% oxygen and sats are 97%. Patient is in no distress but is not orientated to time and place and seems confused. RT will continue to monitor.

## 2017-06-12 NOTE — Progress Notes (Signed)
Called and updated wife and daughter.  Hermina BartersBOWMAN, Bhavik Cabiness M, RN

## 2017-06-12 NOTE — Progress Notes (Signed)
RT NOTE:  ALINE dislodged during bath. 2 RT's attempted placement of new ALINE 2x each without success. RN aware. MD aware. Dayshift RT aware. New ALINE bag hanging @ bedside.

## 2017-06-12 NOTE — Progress Notes (Signed)
Subjective:  Self extubated- unfortunately minimal UOP even with 160 of lasix IV Objective Vital signs in last 24 hours: Vitals:   06/12/17 0730 06/12/17 0745 06/12/17 0751 06/12/17 0800  BP: 126/61 (!) 121/45 (!) 121/45 (!) 106/92  Pulse: 85 90 91 85  Resp: (!) 22 (!) 28 (!) 23 (!) 28  Temp: 98.4 F (36.9 C) 98.2 F (36.8 C) 98.2 F (36.8 C) 98.2 F (36.8 C)  TempSrc:      SpO2: 97% 99% 99% 98%  Weight:      Height:       Weight change: 2.9 kg (6 lb 6.3 oz)  Intake/Output Summary (Last 24 hours) at 06/12/17 0826 Last data filed at 06/12/17 0800  Gross per 24 hour  Intake          4418.91 ml  Output              530 ml  Net          3888.91 ml    Assessment/Plan: 76 year old male with diabetes status post recent MI and postop day 2 from CABG with acute on chronic renal failure 1.Renal- acute on chronic renal failure. Urinalysis previously bland given the scenario is likely ATN from hemodynamic cause. There are no absolute indications for dialysis at this time, volume is the only aspect that might get [place] into trouble, now more so due to the fact that he self extubated. Urine output is minimal on high dose Lasix- cont for now as some is better than none.  I am hopeful for eventual renal recovery, the only question would be if he will require HD support before he improves  2. Hypertension/volume  - getting volume overloaded secondary to decreased urine output. challenge with Lasix ,  minimize inputs 3. Elytes- potassium okay, sodium down likely secondary to volume 4. Anemia  - situational secondary to surgery- supportive care and transfuse as needed 5. Pulm- self extubated but with volume on board and really no efficient way to remove it, he will likely get into trouble again but watching closely     [place]: Basic Metabolic Panel:  Recent Labs Lab 05/14/2017 0600  06/10/17 0602  06/10/17 1626 06/11/17 0500 06/11/17 1901  NA 133*  < > 133*  --  133*  128* 130*  K 4.2  < > 4.1  --  4.8 4.8 4.0  CL 100*  < > 104  --  100* 99* 97*  CO2 26  --  22  --   --  19*  --   GLUCOSE 98  < > 108*  --  203* 329* 242*  BUN 26*  < > 22*  --  24* 29* 31*  CREATININE 1.49*  < > 1.78*  < > 2.30* 3.06* 3.90*  CALCIUM 8.2*  --  7.3*  --   --  7.4*  --   < > = values in this interval not displayed. Liver Function Tests:  Recent Labs Lab 06/08/17 0725  AST 27  ALT 14*  ALKPHOS 46  BILITOT 0.5  PROT 6.3*  ALBUMIN 3.2*   No results for input(s): LIPASE, AMYLASE in the last 168 hours. No results for input(s): AMMONIA in the last 168 hours. CBC:  Recent Labs Lab 05/25/2017 1516 06/02/2017 2102  06/10/17 0602 06/10/17 1615 06/10/17 1626 06/11/17 0500 06/11/17 1901  WBC 25.6* 19.2*  --  18.2* 27.2*  --  30.3*  --   HGB 10.4* 9.3*  < >  9.0* 9.0* 9.5* 8.5* 9.2*  HCT 31.3* 27.8*  < > 26.6* 27.7* 28.0* 26.0* 27.0*  MCV 86.0 86.3  --  85.0 87.7  --  87.5  --   PLT 211 204  --  180 193  --  191  --   < > = values in this interval not displayed. Cardiac Enzymes:  Recent Labs Lab 05/24/2017 1723 05/30/2017 2351 06/06/17 0526 06/06/17 1120  TROPONINI 0.72* 1.49* 1.46* 1.16*   CBG:  Recent Labs Lab 06/12/17 0159 06/12/17 0259 06/12/17 0403 06/12/17 0456 06/12/17 0741  GLUCAP 126* 118* 115* 101* 97    Iron Studies: No results for input(s): IRON, TIBC, TRANSFERRIN, FERRITIN in the last 72 hours. Studies/Results: Dg Chest Port 1 View  Result Date: 06/12/2017 CLINICAL DATA:  Respiratory failure EXAM: PORTABLE CHEST 1 VIEW COMPARISON:  06/11/2017 FINDINGS: Endotracheal and NG tubes are stable. Chest tube and mediastinal tubes are stable. Swan-Ganz catheter is stable via the right jugular vein and its tip in the lower pulmonary artery. The heart remains prominent. Lungs are under aerated. Increasing vascular congestion and suspected edema have developed. Postoperative changes from CABG are again noted. There is no pneumothorax. IMPRESSION: No  pneumothorax. Increasing vascular congestion and interstitial edema. Electronically Signed   By: Marybelle Killings M.D.   On: 06/12/2017 07:35   Dg Chest Port 1 View  Result Date: 06/11/2017 CLINICAL DATA:  ET tube placement EXAM: PORTABLE CHEST 1 VIEW COMPARISON:  06/11/2017 FINDINGS: Endotracheal tube is 3.5 cm above the carina. Changes of CABG. Interval placement of NG tube in the stomach. Swan-Ganz catheter tip is in the main pulmonary artery. Left chest tube is in place without pneumothorax. Cardiomegaly with vascular congestion and mild interstitial edema, improved since prior study. IMPRESSION: Improving interstitial edema pattern. Endotracheal tube is 3.5 cm above the carina. No pneumothorax. Electronically Signed   By: Rolm Baptise M.D.   On: 06/11/2017 10:16   Dg Chest Port 1 View  Result Date: 06/11/2017 CLINICAL DATA:  76 year old male with shortness breath. Post CABG. Subsequent encounter. EXAM: PORTABLE CHEST 1 VIEW COMPARISON:  06/10/2017 and 05/24/2017. FINDINGS: Post CABG.  Vertically directed wires next to sternum unchanged. Cardiomegaly. Pulmonary vascular congestion superimposed upon chronic lung changes. Left chest tube in place without gross pneumothorax. Swan-Ganz catheter enters from the right with tip at the level of the main pulmonary artery/pulmonary outflow tract. Nasogastric tube and endotracheal tube removed. IMPRESSION: Nasogastric tube and endotracheal tube removed otherwise no significant change as detailed above. Electronically Signed   By: Genia Del M.D.   On: 06/11/2017 07:38   Medications: Infusions: . sodium chloride 10 mL/hr at 06/12/17 0800  . sodium chloride Stopped (06/10/17 0700)  . sodium chloride Stopped (06/10/17 0700)  . sodium chloride 10 mL/hr at 06/12/17 0800  . amiodarone 30 mg/hr (06/12/17 0800)  . cefTAZidime (FORTAZ)  IV Stopped (06/11/17 1159)  . DOPamine 3 mcg/kg/min (06/12/17 0800)  . furosemide 160 mg (06/11/17 1719)  . insulin (NOVOLIN-R)  infusion 5 Units/hr (06/12/17 0800)  . lactated ringers    . lactated ringers 10 mL/hr at 06/12/17 0800  . milrinone 0.25 mcg/kg/min (06/12/17 0800)  . nitroGLYCERIN Stopped (06/03/2017 1500)  . norepinephrine (LEVOPHED) Adult infusion 24.96 mcg/min (06/12/17 0800)    Scheduled Medications: . acetaminophen  1,000 mg Oral Q6H   Or  . acetaminophen (TYLENOL) oral liquid 160 mg/5 mL  1,000 mg Per Tube Q6H  . aspirin EC  325 mg Oral Daily   Or  . aspirin  324 mg Per Tube Daily  . atorvastatin  80 mg Oral q1800  . bisacodyl  10 mg Oral Daily   Or  . bisacodyl  10 mg Rectal Daily  . chlorhexidine gluconate (MEDLINE KIT)  15 mL Mouth Rinse BID  . docusate sodium  200 mg Oral Daily  . insulin regular  0-10 Units Intravenous TID WC  . mouth rinse  15 mL Mouth Rinse QID  . metoCLOPramide (REGLAN) injection  10 mg Intravenous Q6H  . metoprolol tartrate  12.5 mg Oral BID   Or  . metoprolol tartrate  12.5 mg Per Tube BID  . pantoprazole (PROTONIX) IV  40 mg Intravenous QHS  . sodium chloride flush  3 mL Intravenous Q12H    have reviewed scheduled and prn medications.  Physical Exam: General: more alert, on O2 after extubation Heart: RRR Lungs: CBS bilat Abdomen: obese, non tender Extremities: pitting edema    06/12/2017,8:26 AM  LOS: 7 days

## 2017-06-12 NOTE — Progress Notes (Signed)
Patient self extubated during shift report, ABG obtained by respiratory, CCM aware, patient currently on venti mask at 40% 8L saturation 97-100%, patient alert to self only, IS 250 achieved-will continue to work with patient on this, dangled patient with 3 RN's, patient still very edematous with minimal urine output. Will continue to monitor.  Hermina BartersBOWMAN, Bevely Hackbart M, RN

## 2017-06-12 DEATH — deceased

## 2017-06-13 ENCOUNTER — Inpatient Hospital Stay (HOSPITAL_COMMUNITY): Payer: Medicare Other

## 2017-06-13 DIAGNOSIS — N170 Acute kidney failure with tubular necrosis: Secondary | ICD-10-CM

## 2017-06-13 LAB — PHOSPHORUS: PHOSPHORUS: 4 mg/dL (ref 2.5–4.6)

## 2017-06-13 LAB — COMPREHENSIVE METABOLIC PANEL
ALT: 25 U/L (ref 17–63)
AST: 39 U/L (ref 15–41)
Albumin: 2.5 g/dL — ABNORMAL LOW (ref 3.5–5.0)
Alkaline Phosphatase: 79 U/L (ref 38–126)
Anion gap: 11 (ref 5–15)
BUN: 47 mg/dL — ABNORMAL HIGH (ref 6–20)
CO2: 22 mmol/L (ref 22–32)
Calcium: 7.2 mg/dL — ABNORMAL LOW (ref 8.9–10.3)
Chloride: 98 mmol/L — ABNORMAL LOW (ref 101–111)
Creatinine, Ser: 4.26 mg/dL — ABNORMAL HIGH (ref 0.61–1.24)
GFR calc Af Amer: 15 mL/min — ABNORMAL LOW (ref >60)
GFR calc non Af Amer: 13 mL/min — ABNORMAL LOW (ref >60)
Glucose, Bld: 118 mg/dL — ABNORMAL HIGH (ref 65–99)
Potassium: 4.1 mmol/L (ref 3.5–5.1)
Sodium: 131 mmol/L — ABNORMAL LOW (ref 135–145)
Total Bilirubin: 0.8 mg/dL (ref 0.3–1.2)
Total Protein: 5.1 g/dL — ABNORMAL LOW (ref 6.5–8.1)

## 2017-06-13 LAB — GLUCOSE, CAPILLARY
GLUCOSE-CAPILLARY: 100 mg/dL — AB (ref 65–99)
GLUCOSE-CAPILLARY: 102 mg/dL — AB (ref 65–99)
GLUCOSE-CAPILLARY: 111 mg/dL — AB (ref 65–99)
GLUCOSE-CAPILLARY: 113 mg/dL — AB (ref 65–99)
GLUCOSE-CAPILLARY: 115 mg/dL — AB (ref 65–99)
GLUCOSE-CAPILLARY: 117 mg/dL — AB (ref 65–99)
GLUCOSE-CAPILLARY: 121 mg/dL — AB (ref 65–99)
GLUCOSE-CAPILLARY: 122 mg/dL — AB (ref 65–99)
GLUCOSE-CAPILLARY: 92 mg/dL (ref 65–99)
Glucose-Capillary: 116 mg/dL — ABNORMAL HIGH (ref 65–99)
Glucose-Capillary: 110 mg/dL — ABNORMAL HIGH (ref 65–99)
Glucose-Capillary: 112 mg/dL — ABNORMAL HIGH (ref 65–99)
Glucose-Capillary: 109 mg/dL — ABNORMAL HIGH (ref 65–99)
Glucose-Capillary: 118 mg/dL — ABNORMAL HIGH (ref 65–99)
Glucose-Capillary: 132 mg/dL — ABNORMAL HIGH (ref 65–99)
Glucose-Capillary: 128 mg/dL — ABNORMAL HIGH (ref 65–99)
Glucose-Capillary: 114 mg/dL — ABNORMAL HIGH (ref 65–99)
Glucose-Capillary: 106 mg/dL — ABNORMAL HIGH (ref 65–99)
Glucose-Capillary: 94 mg/dL (ref 65–99)
Glucose-Capillary: 102 mg/dL — ABNORMAL HIGH (ref 65–99)
Glucose-Capillary: 109 mg/dL — ABNORMAL HIGH (ref 65–99)
Glucose-Capillary: 112 mg/dL — ABNORMAL HIGH (ref 65–99)

## 2017-06-13 LAB — RENAL FUNCTION PANEL
ALBUMIN: 2.5 g/dL — AB (ref 3.5–5.0)
ANION GAP: 12 (ref 5–15)
BUN: 49 mg/dL — AB (ref 6–20)
CO2: 21 mmol/L — AB (ref 22–32)
Calcium: 7.2 mg/dL — ABNORMAL LOW (ref 8.9–10.3)
Chloride: 97 mmol/L — ABNORMAL LOW (ref 101–111)
Creatinine, Ser: 4.1 mg/dL — ABNORMAL HIGH (ref 0.61–1.24)
GFR calc Af Amer: 15 mL/min — ABNORMAL LOW (ref >60)
GFR calc non Af Amer: 13 mL/min — ABNORMAL LOW (ref >60)
GLUCOSE: 97 mg/dL (ref 65–99)
PHOSPHORUS: 4.4 mg/dL (ref 2.5–4.6)
POTASSIUM: 4.1 mmol/L (ref 3.5–5.1)
SODIUM: 130 mmol/L — AB (ref 135–145)

## 2017-06-13 LAB — BLOOD GAS, ARTERIAL
Acid-base deficit: 3.9 mmol/L — ABNORMAL HIGH (ref 0.0–2.0)
Bicarbonate: 21.3 mmol/L (ref 20.0–28.0)
Delivery systems: POSITIVE
Expiratory PAP: 5
FIO2: 40
Inspiratory PAP: 15
O2 Saturation: 94.9 %
Patient temperature: 98.6
pCO2 arterial: 43.1 mmHg (ref 32.0–48.0)
pH, Arterial: 7.314 — ABNORMAL LOW (ref 7.350–7.450)
pO2, Arterial: 76.8 mmHg — ABNORMAL LOW (ref 83.0–108.0)

## 2017-06-13 LAB — TYPE AND SCREEN
ABO/RH(D): O POS
Antibody Screen: NEGATIVE
Unit division: 0

## 2017-06-13 LAB — CBC
HCT: 26.3 % — ABNORMAL LOW (ref 39.0–52.0)
Hemoglobin: 9 g/dL — ABNORMAL LOW (ref 13.0–17.0)
MCH: 29 pg (ref 26.0–34.0)
MCHC: 34.2 g/dL (ref 30.0–36.0)
MCV: 84.8 fL (ref 78.0–100.0)
Platelets: 195 10*3/uL (ref 150–400)
RBC: 3.1 MIL/uL — ABNORMAL LOW (ref 4.22–5.81)
RDW: 15 % (ref 11.5–15.5)
WBC: 21.2 10*3/uL — ABNORMAL HIGH (ref 4.0–10.5)

## 2017-06-13 LAB — POCT I-STAT 3, ART BLOOD GAS (G3+)
ACID-BASE DEFICIT: 6 mmol/L — AB (ref 0.0–2.0)
Acid-base deficit: 5 mmol/L — ABNORMAL HIGH (ref 0.0–2.0)
BICARBONATE: 21.3 mmol/L (ref 20.0–28.0)
Bicarbonate: 20.2 mmol/L (ref 20.0–28.0)
O2 SAT: 93 %
O2 SAT: 97 %
PCO2 ART: 40.3 mmHg (ref 32.0–48.0)
PCO2 ART: 43.5 mmHg (ref 32.0–48.0)
PH ART: 7.303 — AB (ref 7.350–7.450)
Patient temperature: 36.1
TCO2: 21 mmol/L (ref 0–100)
TCO2: 23 mmol/L (ref 0–100)
pH, Arterial: 7.294 — ABNORMAL LOW (ref 7.350–7.450)
pO2, Arterial: 70 mmHg — ABNORMAL LOW (ref 83.0–108.0)
pO2, Arterial: 96 mmHg (ref 83.0–108.0)

## 2017-06-13 LAB — COOXEMETRY PANEL
Carboxyhemoglobin: 1.1 % (ref 0.5–1.5)
Carboxyhemoglobin: 1.3 % (ref 0.5–1.5)
Methemoglobin: 1.3 % (ref 0.0–1.5)
Methemoglobin: 0.8 % (ref 0.0–1.5)
O2 Saturation: 67.9 %
O2 Saturation: 65.7 %
Total hemoglobin: 9.1 g/dL — ABNORMAL LOW (ref 12.0–16.0)
Total hemoglobin: 8.8 g/dL — ABNORMAL LOW (ref 12.0–16.0)

## 2017-06-13 LAB — MAGNESIUM: MAGNESIUM: 2.6 mg/dL — AB (ref 1.7–2.4)

## 2017-06-13 LAB — CULTURE, RESPIRATORY W GRAM STAIN: Special Requests: NORMAL

## 2017-06-13 LAB — BPAM RBC
Blood Product Expiration Date: 201808302359
ISSUE DATE / TIME: 201808011938
Unit Type and Rh: 5100

## 2017-06-13 MED ORDER — LIDOCAINE HCL (CARDIAC) 20 MG/ML IV SOLN
100.0000 mg | Freq: Once | INTRAVENOUS | Status: AC
  Administered 2017-06-13: 100 mg via INTRAVENOUS

## 2017-06-13 MED ORDER — CHLORHEXIDINE GLUCONATE CLOTH 2 % EX PADS: 6.0000 | MEDICATED_PAD | Freq: Every day | CUTANEOUS | Status: DC

## 2017-06-13 MED ORDER — PRISMASOL BGK 4/2.5 32-4-2.5 MEQ/L IV SOLN
INTRAVENOUS | Status: DC
  Administered 2017-06-13 – 2017-06-25 (×64): via INTRAVENOUS_CENTRAL
  Filled 2017-06-13 (×90): qty 5000

## 2017-06-13 MED ORDER — PRISMASOL BGK 4/2.5 32-4-2.5 MEQ/L IV SOLN
INTRAVENOUS | Status: DC
  Administered 2017-06-13 – 2017-06-24 (×19): via INTRAVENOUS_CENTRAL
  Filled 2017-06-13 (×18): qty 5000

## 2017-06-13 MED ORDER — AMIODARONE IV BOLUS ONLY 150 MG/100ML
150.0000 mg | Freq: Once | INTRAVENOUS | Status: AC
  Administered 2017-06-13: 150 mg via INTRAVENOUS
  Filled 2017-06-13: qty 100

## 2017-06-13 MED ORDER — DEXTROSE 5 % IV SOLN
2.0000 g | Freq: Two times a day (BID) | INTRAVENOUS | Status: DC
  Administered 2017-06-13 – 2017-06-17 (×9): 2 g via INTRAVENOUS
  Filled 2017-06-13 (×11): qty 2

## 2017-06-13 MED ORDER — PRISMASOL BGK 4/2.5 32-4-2.5 MEQ/L IV SOLN
INTRAVENOUS | Status: DC
  Administered 2017-06-13 – 2017-06-24 (×18): via INTRAVENOUS_CENTRAL
  Filled 2017-06-13 (×18): qty 5000

## 2017-06-13 MED ORDER — HEPARIN SODIUM (PORCINE) 1000 UNIT/ML DIALYSIS
1000.0000 [IU] | INTRAMUSCULAR | Status: DC | PRN
  Administered 2017-06-18 – 2017-06-21 (×2): 3000 [IU] via INTRAVENOUS_CENTRAL
  Filled 2017-06-13: qty 2
  Filled 2017-06-13 (×4): qty 6

## 2017-06-13 MED ORDER — SODIUM CHLORIDE 0.9 % FOR CRRT
INTRAVENOUS_CENTRAL | Status: DC | PRN
  Filled 2017-06-13: qty 1000

## 2017-06-13 NOTE — Progress Notes (Signed)
Subjective:  On bipap, following commands, minimal UOP even with 160 of lasix IV Objective Vital signs in last 24 hours: Vitals:   06/13/17 1030 06/13/17 1045 06/13/17 1100 06/13/17 1115  BP:   (!) 116/49 (!) 108/34  Pulse: 96 (!) 52 (!) 37 (!) 46  Resp: 19 (!) 23 (!) 32 (!) 28  Temp: 97.9 F (36.6 C) 98.1 F (36.7 C) 98.1 F (36.7 C) 97.9 F (36.6 C)  TempSrc:      SpO2: 96% 95% 90% 91%  Weight:      Height:       Weight change: -4 lb 3 oz (-1.9 kg)  Intake/Output Summary (Last 24 hours) at 06/13/17 1127 Last data filed at 06/13/17 1115  Gross per 24 hour  Intake          2976.42 ml  Output              495 ml  Net          2481.42 ml    Assessment/Plan: 76 year old male with diabetes status post recent MI and postop day 4 from CABG (7/29) with acute on chronic renal failure 1.Renal- acute on chronic renal failure. Urinalysis previously bland given the scenario is likely ATN from hemodynamic cause. Urine output continues to be minimal on high dose Lasix.  Creatinine and BUN continue to rise.  At this point will start CRRT In order to more effectively manage his volume status and hopefully keep him extubated. Will need temporary HD catheter - to be placed by CCM 2. Hypertension/volume  - getting volume overloaded secondary to decreased urine output. Will start CRRT.   3. Elytes- potassium okay, sodium down likely secondary to volume 4. Anemia  - situational secondary to surgery- supportive care and transfuse as needed, Hgb currently stable at 9.0 5. Pulm- self extubated but with volume on board and really no efficient way to remove it,  At this point will try and remove fluid with CRRT and hope to avoid re-intubation.       Philip Love   Patient seen and examined, agree with above note with above modifications. Unfortunately patient continues to have poor urine output on high-dose Lasix. He does not have acute urgent need for dialysis but I'm concerned about his volume  status leading to reintubation. Therefore, have discussed with the family and will initiate CRRT today mostly to manage volume- appreciate CCM to place a Silvano Rusk, MD 06/13/2017      Labs: Basic Metabolic Panel:  Recent Labs Lab 06/12/17 0905 06/12/17 1047 06/13/17 0344  NA 130* 130* 131*  K 4.2 4.1 4.1  CL 100* 98* 98*  CO2 19* 23 22  GLUCOSE 108* 115* 118*  BUN 37* 38* 47*  CREATININE 3.86* 4.15* 4.26*  CALCIUM 7.1* 7.2* 7.2*  PHOS 3.6  --  4.0   Liver Function Tests:  Recent Labs Lab 06/12/17 0905 06/12/17 1047 06/13/17 0344  AST 43* 38 39  ALT _0 ALKPHOS 62 58 79  BILITOT 0.8 1.0 0.8  PROT 5.1* 4.6* 5.1*  ALBUMIN 2.2* 2.1* 2.5*   No results for input(s): LIPASE, AMYLASE in the last 168 hours. No results for input(s): AMMONIA in the last 168 hours. CBC:  Recent Labs Lab 06/10/17 0602 06/10/17 1615  06/11/17 0500 06/11/17 1901 06/12/17 1047 06/13/17 0344  WBC 18.2* 27.2*  --  30.3*  --  23.8* 21.2*  HGB 9.0* 9.0*  < > 8.5* 9.2* 8.1* 9.0*  HCT  26.6* 27.7*  < > 26.0* 27.0* 24.1* 26.3*  MCV 85.0 87.7  --  87.5  --  85.2 84.8  PLT 180 193  --  191  --  180 195  < > = values in this interval not displayed. Cardiac Enzymes: No results for input(s): CKTOTAL, CKMB, CKMBINDEX, TROPONINI in the last 168 hours. CBG:  Recent Labs Lab 06/13/17 0557 06/13/17 0701 06/13/17 0805 06/13/17 0912 06/13/17 1020  GLUCAP 112* 113* 109* 115* 118*    Iron Studies: No results for input(s): IRON, TIBC, TRANSFERRIN, FERRITIN in the last 72 hours. Studies/Results: Dg Chest Port 1 View  Result Date: 06/13/2017 CLINICAL DATA:  Shortness of breath, respiratory failure. EXAM: PORTABLE CHEST 1 VIEW COMPARISON:  Multiple prior chest x-rays, most recently dated yesterday. FINDINGS: Right internal jugular Swan-Ganz catheter is unchanged in position. Chest and mediastinal tubes are also in unchanged position. Interval removal of endotracheal and enteric  tubes. Prior CABG. Cardiomegaly and diffuse patchy opacities throughout both lungs are similar to prior study. Low lung volumes. Small bilateral pleural effusions are unchanged. No pneumothorax. No acute osseous abnormality. IMPRESSION: Unchanged pulmonary vascular congestion and interstitial edema. Electronically Signed   By: Titus Dubin M.D.   On: 06/13/2017 07:25   Dg Chest Port 1 View  Result Date: 06/12/2017 CLINICAL DATA:  Respiratory failure EXAM: PORTABLE CHEST 1 VIEW COMPARISON:  06/11/2017 FINDINGS: Endotracheal and NG tubes are stable. Chest tube and mediastinal tubes are stable. Swan-Ganz catheter is stable via the right jugular vein and its tip in the lower pulmonary artery. The heart remains prominent. Lungs are under aerated. Increasing vascular congestion and suspected edema have developed. Postoperative changes from CABG are again noted. There is no pneumothorax. IMPRESSION: No pneumothorax. Increasing vascular congestion and interstitial edema. Electronically Signed   By: Marybelle Killings M.D.   On: 06/12/2017 07:35   Medications: Infusions: . sodium chloride Stopped (06/12/17 1900)  . sodium chloride Stopped (06/10/17 0700)  . sodium chloride 20 mL/hr at 06/13/17 0900  . sodium chloride Stopped (06/12/17 1900)  . albumin human    . amiodarone 30 mg/hr (06/13/17 1123)  . cefTAZidime (FORTAZ)  IV 2 g (06/13/17 1125)  . DOPamine 4 mcg/kg/min (06/13/17 0900)  . furosemide Stopped (06/13/17 0911)  . insulin (NOVOLIN-R) infusion 6.3 Units/hr (06/13/17 1117)  . lactated ringers    . lactated ringers Stopped (06/12/17 1900)  . milrinone 0.25 mcg/kg/min (06/13/17 0900)  . norepinephrine (LEVOPHED) Adult infusion 28 mcg/min (06/13/17 0947)  . dialysis replacement fluid (prismasate)    . dialysis replacement fluid (prismasate)    . dialysate (PRISMASATE)    . sodium chloride      Scheduled Medications: . acetaminophen  1,000 mg Oral Q6H   Or  . acetaminophen (TYLENOL) oral  liquid 160 mg/5 mL  1,000 mg Per Tube Q6H  . aspirin EC  325 mg Oral Daily   Or  . aspirin  324 mg Per Tube Daily  . atorvastatin  80 mg Oral q1800  . bisacodyl  10 mg Oral Daily   Or  . bisacodyl  10 mg Rectal Daily  . chlorhexidine gluconate (MEDLINE KIT)  15 mL Mouth Rinse BID  . Chlorhexidine Gluconate Cloth  6 each Topical Daily  . docusate sodium  200 mg Oral Daily  . enoxaparin (LOVENOX) injection  30 mg Subcutaneous Daily  . insulin regular  0-10 Units Intravenous TID WC  . mouth rinse  15 mL Mouth Rinse QID  . metoCLOPramide (REGLAN) injection  10  mg Intravenous Q6H  . metoprolol tartrate  12.5 mg Oral BID   Or  . metoprolol tartrate  12.5 mg Per Tube BID  . pantoprazole (PROTONIX) IV  40 mg Intravenous QHS  . sodium chloride flush  10-40 mL Intracatheter Q12H  . sodium chloride flush  3 mL Intravenous Q12H    have reviewed scheduled and prn medications.  Physical Exam: General: Follows commands, on bipap Heart: RRR Lungs: CBS bilat Abdomen: obese, non tender Extremities: pitting edema in upper and lower extremities bilaterally    06/13/2017,11:27 AM  LOS: 8 days

## 2017-06-13 NOTE — Progress Notes (Signed)
Patient sat on side of bed with 3 RN assist, patient was then NTS suctioned, Mid sternal dressing removed and painted with betadine, chest tube dressings changed, bilateral leg dressings removed and painted with betadine, family at bedside, call bell in reach, will continue to monitor.  Hermina BartersBOWMAN, Krystyn Picking M, RN

## 2017-06-13 NOTE — Progress Notes (Signed)
1 amp of bicarb given per Dr. Tyrone SageGerhardt verbal order for ABG.  Hermina BartersBOWMAN, Kaneisha Ellenberger M, RN

## 2017-06-13 NOTE — Progress Notes (Signed)
RT NOTE:  RT attempted to switch patient to 40% Venti Mask, however, pt did not tolerate. RR increased 40-48 and maintained, SpO2 decreased to 83%. Pt placed back on BIPAP after 5 minutes and shortly RR decreased to 22, SpO2 increased to 100%. Pt more comfortable on BIPAP. RN @ bedside @ the time.

## 2017-06-13 NOTE — Progress Notes (Signed)
Dangled patient on side of bed 3 assist.  Lemoine Goyne, Geroge BasemanHEATHER M, RN

## 2017-06-13 NOTE — Progress Notes (Signed)
Initial Nutrition Assessment  DOCUMENTATION CODES:   Morbid obesity  INTERVENTION:    If TF started, rec Vital High Protein at goal rate of 55 ml/h (1320 ml per day) and Prostat 30 ml QID  TF regimen to provide 1720 kcals, 175 gm protein, 1103 ml of free water  NUTRITION DIAGNOSIS:   Inadequate oral intake related to inability to eat as evidenced by NPO status  GOAL:   Provide needs based on ASPEN/SCCM guidelines  MONITOR:   Vent status, Labs, Weight trends, Skin, I & O's  REASON FOR ASSESSMENT:   Ventilator  ASSESSMENT:   76 yo obese former smoker with OSA , and insulin-dependent diabetes [place] [place] [place] He presented to the emergency department there with 2 weeks of progressive shortness of breath ankle swelling and chest discomfort.   Patient is currently intubated on ventilator support Temp (24hrs), Avg:98.5 F (36.9 C), Min:97.7 F (36.5 C), Max:99.3 F (37.4 C)  Pt s/p CABG 7/29. Emergently re-intubated for mixed respiratory/metabolic acidosis. Medications reviewed and include Reglan and multiple pressors. Labs reviewed. Na 131 (L). CBG's [id number].  Nephrology following. CRRT started 8/2.  Nutrition focused physical exam completed.  No muscle or subcutaneous fat depletion noticed.  Diet Order:  Diet NPO time specified Except for: Sips with Meds  Skin:  Reviewed, no issues  Last BM:  7/28  Height:   Ht Readings from Last 1 Encounters:  05/13/2017 5\' 8"  (1.727 m)   Weight:   Wt Readings from Last 1 Encounters:  06/13/17 282 lb 10.1 oz (128.2 kg)   Ideal Body Weight:  70 kg  BMI:  Body mass index is 42.97 kg/m.  Estimated Nutritional Needs:   Kcal:  [id number]  Protein:  >/= 175 gm  Fluid:  per MD  EDUCATION NEEDS:   No education needs identified at this time  Maureen ChattersKatie Collyn Selk, RD, LDN Pager #: [phone] After-Hours Pager #: [phone]

## 2017-06-13 NOTE — Progress Notes (Signed)
[facility] / CRITICAL CARE MEDICINE   Name: Noell Shular MRN: [id number] DOB: 1940-11-17    ADMISSION DATE:  05/25/2017 CONSULTATION DATE:  06/11/2017  REFERRING MD:  Morton Peters  CHIEF COMPLAINT:  Respiratory Decompensation/ vent management  Brief Patient Summary:   76 year old obese former smoker with OSA , and insulin-dependent diabetes recently moved from [place] to [facility]. He presented to the emergency department there with 2 weeks of progressive shortness of breath ankle swelling and chest discomfort. He had nonspecific EKG changes but was in sinus rhythm. Cardiac enzymes were mildly positive and chest x-ray showed mild CHF. He was transferred to this hospital. His creatinine was 2. Echocardiogram showed EF of 45%. No significant MR. Creatinine improved to 1.7 and he underwent coronary angiography and right heart cath. Coronary showed a 90% left main stenosis, proximal 80-90% LAD stenosis, 90% stenosis of the mid RCA and 80% stenosis of the proximal circumflex. LVEDP is 12. Right heart cath showed cardiac output of 5.5 L/m with normal right-sided pressures. The patient was admitted to the ICU placed on IV heparin and nitroglycerin. His creatinine post cath remains at 1.7.  Blood sugars are fairly well controlled.Patient underwent CABG x 3 with TEE using Left internal mammary artery and right saphenous leg vein on  06/03/2017. He was recovering in the CVICU, was extubated 06/10/2017. He refused CPAP the night of 7/30-7/31> He was in atrial fibrillation and when bolused with Amio he had a rhythm change with long pauses and significant change in mental status requiring emergent re-intubation per Dr. Delton Coombes 7/31. ABG showed a metabolic acidosis.( 7.311/ 41.5/390/22/ 21.1)  CCM consulted for vent management and evaluation of respiratory decompensation.   SUBJECTIVE:  Remains on BiPAP  Per RN, failed attempt to transition to VM 2/2 increased WOB and desat to 83% Remains on dopamine  , levophed , amio, milrinone 0.25 mcg/kg/min, insulin gtts Worsening renal fxn/low UOP   VITAL SIGNS: BP (!) 118/39   Pulse 97   Temp 97.7 F (36.5 C)   Resp 19   Ht 5\' 8"  (1.727 m)   Wt 282 lb 10.1 oz (128.2 kg)   SpO2 99%   BMI 42.97 kg/m   HEMODYNAMICS: PAP: (48-74)/(11-30) 58/17 CO:  [5.5 L/min-7.6 L/min] 7.6 L/min CI:  [2.5 L/min/m2-3.4 L/min/m2] 3.4 L/min/m2  VENTILATOR SETTINGS: Vent Mode: BIPAP FiO2 (%):  [40 %] 40 % PEEP:  [5 cmH20] 5 cmH20 Pressure Support:  [10 cmH20] 10 cmH20  INTAKE / OUTPUT: I/O last 3 completed shifts: In: 4818.9 [I.V.:4039.9; Blood:315; IV Piggyback:464] Out: 960 [Urine:440; Chest Tube:520]  PHYSICAL EXAMINATION: General:  Adult male sitting in bed in NAD HEENT: MM pink/moist, PERRL, thick neck Neuro: More alert today, follows comands, MAE CV: IRR, distant, intermittently paced, pacer wires noted, Ct x 2 PULM: even/non-labored, bilaterally rales, faint exp wheeze, on BiPAP 10/5, 40% GI: obese,soft, non-tender, bs + Extremities: warm/dry, anasarca Skin: no rashes or lesions, midline chest dsg CDI, external pacer wires  LABS:  BMET  Recent Labs Lab 06/12/17 0905 06/12/17 1047 06/13/17 0344  NA 130* 130* 131*  K 4.2 4.1 4.1  CL 100* 98* 98*  CO2 19* 23 22  BUN 37* 38* 47*  CREATININE 3.86* 4.15* 4.26*  GLUCOSE 108* 115* 118*    Electrolytes  Recent Labs Lab 06/10/17 1615  06/12/17 0905 06/12/17 1047 06/13/17 0344  CALCIUM  --   < > 7.1* 7.2* 7.2*  MG 2.7*  --  2.5*  --  2.6*  PHOS  --   --  3.6  --  4.0  < > = values in this interval not displayed.  CBC  Recent Labs Lab 06/11/17 0500 06/11/17 1901 06/12/17 1047 06/13/17 0344  WBC 30.3*  --  23.8* 21.2*  HGB 8.5* 9.2* 8.1* 9.0*  HCT 26.0* 27.0* 24.1* 26.3*  PLT 191  --  180 195    Coag's  Recent Labs Lab 06/08/17 0725 06/08/17 1304 06/08/2017 1516  APTT  --  185* 42*  INR 1.18 1.20 1.47    Sepsis Markers  Recent Labs Lab  06/12/17 0905 06/12/17 1832  LATICACIDVEN 2.5* 1.4    ABG  Recent Labs Lab 06/12/17 0836 06/12/17 1649 06/13/17 0400  PHART 7.320* 7.335* 7.314*  PCO2ART 38.7 38.4 43.1  PO2ART 81.0* 73.0* 76.8*    Liver Enzymes  Recent Labs Lab 06/12/17 0905 06/12/17 1047 06/13/17 0344  AST 43* 38 39  ALT 27 25 25   ALKPHOS 62 58 79  BILITOT 0.8 1.0 0.8  ALBUMIN 2.2* 2.1* 2.5*    Cardiac Enzymes  Recent Labs Lab 06/06/17 1120  TROPONINI 1.16*    Glucose  Recent Labs Lab 06/13/17 0304 06/13/17 0402 06/13/17 0505 06/13/17 0557 06/13/17 0701 06/13/17 0805  GLUCAP 111* 121* 102* 112* 113* 109*    Imaging Dg Chest Port 1 View  Result Date: 06/13/2017 CLINICAL DATA:  Shortness of breath, respiratory failure. EXAM: PORTABLE CHEST 1 VIEW COMPARISON:  Multiple prior chest x-rays, most recently dated yesterday. FINDINGS: Right internal jugular Swan-Ganz catheter is unchanged in position. Chest and mediastinal tubes are also in unchanged position. Interval removal of endotracheal and enteric tubes. Prior CABG. Cardiomegaly and diffuse patchy opacities throughout both lungs are similar to prior study. Low lung volumes. Small bilateral pleural effusions are unchanged. No pneumothorax. No acute osseous abnormality. IMPRESSION: Unchanged pulmonary vascular congestion and interstitial edema. Electronically Signed   By: Obie DredgeWilliam T Derry M.D.   On: 06/13/2017 07:25    STUDIES:  Echo 06/06/2017>>EF 40-45%, Moderate LVH, Mildly to moderately  reduced systolic dysfunction, MV mild regurgitation, LA mildly dilated 7/27 LHC/RHC >>90% left main stenosis, proximal 80-90% LAD stenosis, 90% stenosis of the mid RCA and 80% stenosis of the proximal circumflex. LVEDP is 12, cardiac output of 5.5 L/m with normal right-sided pressures.  CULTURES: 7/27 MRSA PCR >> positive Sputum 7/31>>  ANTIBIOTICS: Ceftazidine 7/31>> Zinacef 7/29 preop  SIGNIFICANT EVENTS: 05/29/2017>> CABG 06/10/2017>>  Extubation 06/11/2017>> Emergent re-intubation for mixed respiratory/metabolic acidosis 06/12/2017 > self extubated -> BiPAP 06/13/17 > CRRT  LINES/TUBES: R IJ Swan 7/29 >> Left and Mediastinal CT>> 7/29 R Radial A Line 7/29 >>8/1 ETT 7/31 >> 8/1 Left femoral Aline 8/1 >  DISCUSSION: Pt. Post CABG 7/29, extubated 7/30, developed mixed respiratory / metabolic acidosis 7/31 am after refusing CPAP pm of 7/30, with suspected hypoperfusion 2/2 artrial fibrillation/  rhythm change ( blocked down with Amio bolus). Additionally worsening renal function is a contributing factor. Dr. Delton CoombesByrum emergently intubated the patient 7/31. ABG is improving on mechanical ventilation. Rhythm is now intermittently paced. We will check lactic acid and  rest on vent 7/31 and re-evaluate in am regarding readiness to wean/ extubate after ABG and trending lactates.PCCM to follow.   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 Remains on Amio, Milrinone, dopamine and Levo - co-ox 67.9 P: Telemetry monitoring  Maintain MAP > 65 to ensure adequate end organ perfusion Management per CVTS CT management per CVTS  RENAL A:   AKI w/oliguria- sCr 1.43 on admit CKD NAGMA Hypermag - low UOP, rising sCr despite high dose lasix P:   Management per Renal  Plans to initiate CRRT today Trend BMP/mag /phos / urinary output Strict I&O  GASTROINTESTINAL A:   No  Acute Issues  P:   FAO:[id number]:Protonix NPO Bowel regimen  HEMATOLOGIC A:   Anemia P:  Trend  CBC Transfuse for HGB <8 DVT prophylaxis:SCD's Observe for obvious signs of bleeding/ Monitor chest tube drainage  INFECTIOUS A:   ? 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    - Inter-disciplinary family meet or Palliative Care meeting due by: 06/14/2017.   CCT 40 mins  Posey BoyerBrooke Simpson, AGACNP-BC [facility] Pgr: [phone] or if no answer [phone] 06/13/2017, 9:31 AM  Attending Note:  I have examined patient, reviewed labs, studies and notes. I have discussed the case with B Simpson, and I agree with the data and plans as amended above. 76 year old obese man with  sleep apnea, diabetes, coronary disease who underwent CABG on 7/29. He was reintubated on 7/31 for altered mental status and combined respiratory failure. He has had atrial fibrillation with some significant pauses, is intermittently paced. Self extubated on 8/1 and transitioned directly to BiPAP. He has been on BiPAP ever since, appears to be tolerating, also appears to be benefiting. Continues to have shock, oliguric renal failure is progressing. On my evaluation today he is on BiPAP, awakes to voice and interacts appropriately. He has widespread edema. Coarse breath sounds with decreased air movement at his bases. Heart irregular without a murmur. He does not appear to be paced at this time on telemetry. Abdomen obese, slightly distended. We will plan to continue BiPAP as needed, try to give him a break today. Agree with renal consultation, suspect that he will need at least short-term CVVHD in order to achieve adequate volume removal to achieve overall  stability. We will place a dialysis catheter to facilitate. Independent critical care time is 34 minutes.   Levy Pupaobert Malaysia Crance, MD, PhD 06/13/2017, 12:48 PM [facility] [phone] or if no answer [phone]

## 2017-06-13 NOTE — Progress Notes (Signed)
Called and spoke with Dr. Donata ClayVan Trigt in the OR about placement of a HD cath for CRRT, verbal order was given that CCM could place catheter.  Hermina BartersBOWMAN, Deazia Lampi M, RN

## 2017-06-13 NOTE — Procedures (Signed)
Central Venous Catheter Insertion Procedure Note Elam DutchRobert Joiner [id number] 05-Apr-1941  Procedure: Insertion of Central Venous Dialysis Catheter Indications: for dialysis   Procedure Details Consent: Risks of procedure as well as the alternatives and risks of each were explained to the (patient/caregiver).  Consent for procedure obtained. Time Out: Verified patient identification, verified procedure, site/side was marked, verified correct patient position, special equipment/implants available, medications/allergies/relevent history reviewed, required imaging and test results available.  Real time US was used to ID and cannulate the vessel   Maximum sterile technique was used including antiseptics, cap, gloves, gown, hand hygiene, mask and sheet. Skin prep: Chlorhexidine; local anesthetic administered A antimicrobial bonded/coated triple lumen catheter was placed in the left internal jugular vein using the Seldinger technique.  Evaluation Blood flow good Complications: No apparent complications Patient did tolerate procedure well. Chest X-ray ordered to verify placement.  CXR: pending.  Shelby Mattocksete E Babcock 06/13/2017, 2:15 PM  Simonne MartinetPeter E Babcock ACNP-BC [facility] Pager # [phone] OR # [phone] if no answer

## 2017-06-13 NOTE — Progress Notes (Signed)
TCTS BRIEF SICU PROGRESS NOTE  4 Days Post-Op  S/P Procedure(s) (LRB): CORONARY ARTERY BYPASS GRAFTING (CABG) x3 WITH TEE USING LEFT INTERNAL MAMMARY ARTERY AND RIGHT  SAPHENOUS LEG VEIN HARVESTED ENDOSCOPICALLY (N/A)   Essentially stable day Afib w/ controlled rate, hemodynamics stable w/out any change in drips Breathing comfortably w/ O2 sats 97-99% on 50% BiPAP Tolerating CVVHD reasonably well by report  Plan: Continue current plan  Purcell Nailslarence H Owen, MD 06/13/2017 8:22 PM

## 2017-06-14 ENCOUNTER — Inpatient Hospital Stay (HOSPITAL_COMMUNITY): Payer: Medicare Other

## 2017-06-14 LAB — BLOOD GAS, ARTERIAL
Acid-base deficit: 2.9 mmol/L — ABNORMAL HIGH (ref 0.0–2.0)
Bicarbonate: 22.5 mmol/L (ref 20.0–28.0)
Delivery systems: POSITIVE
Drawn by: 51133
FIO2: 50
Mode: POSITIVE
O2 Saturation: 92.6 %
PEEP: 5 cmH2O
Patient temperature: 98.6
Pressure support: 10 cmH2O
pCO2 arterial: 46.6 mmHg (ref 32.0–48.0)
pH, Arterial: 7.304 — ABNORMAL LOW (ref 7.350–7.450)
pO2, Arterial: 68.9 mmHg — ABNORMAL LOW (ref 83.0–108.0)

## 2017-06-14 LAB — COMPREHENSIVE METABOLIC PANEL
ALT: 22 U/L (ref 17–63)
AST: 36 U/L (ref 15–41)
Albumin: 2.7 g/dL — ABNORMAL LOW (ref 3.5–5.0)
Alkaline Phosphatase: 100 U/L (ref 38–126)
Anion gap: 8 (ref 5–15)
BUN: 38 mg/dL — ABNORMAL HIGH (ref 6–20)
CO2: 23 mmol/L (ref 22–32)
Calcium: 7.5 mg/dL — ABNORMAL LOW (ref 8.9–10.3)
Chloride: 101 mmol/L (ref 101–111)
Creatinine, Ser: 3.18 mg/dL — ABNORMAL HIGH (ref 0.61–1.24)
GFR calc Af Amer: 21 mL/min — ABNORMAL LOW (ref >60)
GFR calc non Af Amer: 18 mL/min — ABNORMAL LOW (ref >60)
Glucose, Bld: 90 mg/dL (ref 65–99)
Potassium: 4.2 mmol/L (ref 3.5–5.1)
Sodium: 132 mmol/L — ABNORMAL LOW (ref 135–145)
Total Bilirubin: 1 mg/dL (ref 0.3–1.2)
Total Protein: 5.2 g/dL — ABNORMAL LOW (ref 6.5–8.1)

## 2017-06-14 LAB — POCT I-STAT 3, ART BLOOD GAS (G3+)
ACID-BASE DEFICIT: 5 mmol/L — AB (ref 0.0–2.0)
ACID-BASE DEFICIT: 4 mmol/L — AB (ref 0.0–2.0)
Acid-base deficit: 5 mmol/L — ABNORMAL HIGH (ref 0.0–2.0)
BICARBONATE: 22 mmol/L (ref 20.0–28.0)
BICARBONATE: 21.3 mmol/L (ref 20.0–28.0)
Bicarbonate: 20.7 mmol/L (ref 20.0–28.0)
O2 SAT: 96 %
O2 SAT: 97 %
O2 Saturation: 100 %
PCO2 ART: 45.7 mmHg (ref 32.0–48.0)
PH ART: 7.284 — AB (ref 7.350–7.450)
PH ART: 7.326 — AB (ref 7.350–7.450)
PO2 ART: 258 mmHg — AB (ref 83.0–108.0)
Patient temperature: 35.6
Patient temperature: 35.7
TCO2: 23 mmol/L (ref 0–100)
TCO2: 22 mmol/L (ref 0–100)
TCO2: 22 mmol/L (ref 0–100)
pCO2 arterial: 39.1 mmHg (ref 32.0–48.0)
pCO2 arterial: 37.1 mmHg (ref 32.0–48.0)
pH, Arterial: 7.369 (ref 7.350–7.450)
pO2, Arterial: 84 mmHg (ref 83.0–108.0)
pO2, Arterial: 92 mmHg (ref 83.0–108.0)

## 2017-06-14 LAB — GLUCOSE, CAPILLARY
GLUCOSE-CAPILLARY: 103 mg/dL — AB (ref 65–99)
GLUCOSE-CAPILLARY: 90 mg/dL (ref 65–99)
GLUCOSE-CAPILLARY: 101 mg/dL — AB (ref 65–99)
GLUCOSE-CAPILLARY: 94 mg/dL (ref 65–99)
GLUCOSE-CAPILLARY: 180 mg/dL — AB (ref 65–99)
Glucose-Capillary: 102 mg/dL — ABNORMAL HIGH (ref 65–99)
Glucose-Capillary: 103 mg/dL — ABNORMAL HIGH (ref 65–99)
Glucose-Capillary: 152 mg/dL — ABNORMAL HIGH (ref 65–99)
Glucose-Capillary: 237 mg/dL — ABNORMAL HIGH (ref 65–99)
Glucose-Capillary: 242 mg/dL — ABNORMAL HIGH (ref 65–99)
Glucose-Capillary: 91 mg/dL (ref 65–99)

## 2017-06-14 LAB — CBC
HCT: 25.2 % — ABNORMAL LOW (ref 39.0–52.0)
Hemoglobin: 8.6 g/dL — ABNORMAL LOW (ref 13.0–17.0)
MCH: 28.7 pg (ref 26.0–34.0)
MCHC: 34.1 g/dL (ref 30.0–36.0)
MCV: 84 fL (ref 78.0–100.0)
Platelets: 194 10*3/uL (ref 150–400)
RBC: 3 MIL/uL — ABNORMAL LOW (ref 4.22–5.81)
RDW: 15.3 % (ref 11.5–15.5)
WBC: 20.2 10*3/uL — ABNORMAL HIGH (ref 4.0–10.5)

## 2017-06-14 LAB — RENAL FUNCTION PANEL
Albumin: 2.8 g/dL — ABNORMAL LOW (ref 3.5–5.0)
Anion gap: 11 (ref 5–15)
BUN: 32 mg/dL — AB (ref 6–20)
CHLORIDE: 100 mmol/L — AB (ref 101–111)
CO2: 22 mmol/L (ref 22–32)
Calcium: 7.5 mg/dL — ABNORMAL LOW (ref 8.9–10.3)
Creatinine, Ser: 2.8 mg/dL — ABNORMAL HIGH (ref 0.61–1.24)
GFR calc Af Amer: 24 mL/min — ABNORMAL LOW (ref >60)
GFR, EST NON AFRICAN AMERICAN: 21 mL/min — AB (ref >60)
Glucose, Bld: 251 mg/dL — ABNORMAL HIGH (ref 65–99)
POTASSIUM: 4.6 mmol/L (ref 3.5–5.1)
Phosphorus: 3.8 mg/dL (ref 2.5–4.6)
Sodium: 133 mmol/L — ABNORMAL LOW (ref 135–145)

## 2017-06-14 LAB — COOXEMETRY PANEL
Carboxyhemoglobin: 0.9 % (ref 0.5–1.5)
Carboxyhemoglobin: 0.7 % (ref 0.5–1.5)
Methemoglobin: 0.8 % (ref 0.0–1.5)
Methemoglobin: 1.1 % (ref 0.0–1.5)
O2 Saturation: 48.6 %
O2 Saturation: 74.1 %
Total hemoglobin: 16.6 g/dL — ABNORMAL HIGH (ref 12.0–16.0)
Total hemoglobin: 14 g/dL (ref 12.0–16.0)

## 2017-06-14 LAB — MAGNESIUM: MAGNESIUM: 2.5 mg/dL — AB (ref 1.7–2.4)

## 2017-06-14 LAB — PHOSPHORUS: Phosphorus: 3.5 mg/dL (ref 2.5–4.6)

## 2017-06-14 MED ORDER — MIDAZOLAM HCL 2 MG/2ML IJ SOLN
INTRAMUSCULAR | Status: AC
  Administered 2017-06-14: 2 mg
  Filled 2017-06-14: qty 2

## 2017-06-14 MED ORDER — MIDAZOLAM HCL 2 MG/2ML IJ SOLN: 1.0000 mg | INTRAMUSCULAR | Status: DC | PRN

## 2017-06-14 MED ORDER — FENTANYL CITRATE (PF) 100 MCG/2ML IJ SOLN: 50.0000 ug | INTRAMUSCULAR | Status: DC | PRN

## 2017-06-14 MED ORDER — CHLORHEXIDINE GLUCONATE 0.12% ORAL RINSE (MEDLINE KIT): 15.0000 mL | Freq: Two times a day (BID) | OROMUCOSAL | Status: DC

## 2017-06-14 MED ORDER — DARBEPOETIN ALFA 100 MCG/0.5ML IJ SOSY: 100.0000 ug | PREFILLED_SYRINGE | INTRAMUSCULAR | Status: DC

## 2017-06-14 MED ORDER — MIDAZOLAM HCL 2 MG/2ML IJ SOLN
1.0000 mg | INTRAMUSCULAR | Status: AC | PRN
  Administered 2017-06-14 (×3): 1 mg via INTRAVENOUS
  Filled 2017-06-14 (×3): qty 2

## 2017-06-14 MED ORDER — FENTANYL CITRATE (PF) 100 MCG/2ML IJ SOLN
INTRAMUSCULAR | Status: AC
  Administered 2017-06-14: 100 ug
  Filled 2017-06-14: qty 2

## 2017-06-14 MED ORDER — LEVALBUTEROL HCL 0.63 MG/3ML IN NEBU
0.6300 mg | INHALATION_SOLUTION | Freq: Four times a day (QID) | RESPIRATORY_TRACT | Status: DC
  Administered 2017-06-14 – 2017-06-25 (×44): 0.63 mg via RESPIRATORY_TRACT
  Filled 2017-06-14 (×44): qty 3

## 2017-06-14 MED ORDER — FENTANYL CITRATE (PF) 100 MCG/2ML IJ SOLN
50.0000 ug | Freq: Once | INTRAMUSCULAR | Status: AC
  Administered 2017-06-14: 50 ug via INTRAVENOUS

## 2017-06-14 MED ORDER — CHLORHEXIDINE GLUCONATE 0.12% ORAL RINSE (MEDLINE KIT)
15.0000 mL | Freq: Two times a day (BID) | OROMUCOSAL | Status: DC
  Administered 2017-06-14 – 2017-06-24 (×21): 15 mL via OROMUCOSAL

## 2017-06-14 MED ORDER — INSULIN ASPART 100 UNIT/ML ~~LOC~~ SOLN
0.0000 [IU] | SUBCUTANEOUS | Status: DC
  Administered 2017-06-14: 4 [IU] via SUBCUTANEOUS
  Administered 2017-06-14 (×3): 8 [IU] via SUBCUTANEOUS
  Administered 2017-06-14 – 2017-06-15 (×4): 2 [IU] via SUBCUTANEOUS
  Administered 2017-06-15 – 2017-06-16 (×3): 4 [IU] via SUBCUTANEOUS
  Administered 2017-06-16: 2 [IU] via SUBCUTANEOUS
  Administered 2017-06-16 (×3): 4 [IU] via SUBCUTANEOUS
  Administered 2017-06-16: 2 [IU] via SUBCUTANEOUS
  Administered 2017-06-17: 4 [IU] via SUBCUTANEOUS
  Administered 2017-06-17: 5 [IU] via SUBCUTANEOUS
  Administered 2017-06-17 (×3): 8 [IU] via SUBCUTANEOUS
  Administered 2017-06-18: 4 [IU] via SUBCUTANEOUS
  Administered 2017-06-18 (×2): 8 [IU] via SUBCUTANEOUS
  Administered 2017-06-18 (×3): 4 [IU] via SUBCUTANEOUS
  Administered 2017-06-18: 8 [IU] via SUBCUTANEOUS
  Administered 2017-06-19: 4 [IU] via SUBCUTANEOUS
  Administered 2017-06-19: 2 [IU] via SUBCUTANEOUS
  Administered 2017-06-19 (×2): 8 [IU] via SUBCUTANEOUS
  Administered 2017-06-19: 2 [IU] via SUBCUTANEOUS
  Administered 2017-06-20: 8 [IU] via SUBCUTANEOUS
  Administered 2017-06-20: 4 [IU] via SUBCUTANEOUS
  Administered 2017-06-20 (×2): 2 [IU] via SUBCUTANEOUS
  Administered 2017-06-20: 16 [IU] via SUBCUTANEOUS
  Administered 2017-06-20: 12 [IU] via SUBCUTANEOUS
  Administered 2017-06-20 – 2017-06-21 (×3): 4 [IU] via SUBCUTANEOUS
  Administered 2017-06-21 (×2): 8 [IU] via SUBCUTANEOUS
  Administered 2017-06-21: 2 [IU] via SUBCUTANEOUS
  Administered 2017-06-21: 4 [IU] via SUBCUTANEOUS
  Administered 2017-06-22: 8 [IU] via SUBCUTANEOUS
  Administered 2017-06-22: 2 [IU] via SUBCUTANEOUS
  Administered 2017-06-22: 8 [IU] via SUBCUTANEOUS
  Administered 2017-06-22: 4 [IU] via SUBCUTANEOUS
  Administered 2017-06-22: 8 [IU] via SUBCUTANEOUS
  Administered 2017-06-23: 4 [IU] via SUBCUTANEOUS
  Administered 2017-06-23: 12 [IU] via SUBCUTANEOUS
  Administered 2017-06-23: 8 [IU] via SUBCUTANEOUS
  Administered 2017-06-23 (×2): 12 [IU] via SUBCUTANEOUS
  Administered 2017-06-23: 16 [IU] via SUBCUTANEOUS
  Administered 2017-06-24: 2 [IU] via SUBCUTANEOUS
  Administered 2017-06-24: 12 [IU] via SUBCUTANEOUS
  Administered 2017-06-24: 8 [IU] via SUBCUTANEOUS
  Administered 2017-06-24: 4 [IU] via SUBCUTANEOUS
  Administered 2017-06-24: 2 [IU] via SUBCUTANEOUS
  Administered 2017-06-24: 12 [IU] via SUBCUTANEOUS

## 2017-06-14 MED ORDER — FENTANYL 2500MCG IN NS 250ML (10MCG/ML) PREMIX INFUSION
25.0000 ug/h | INTRAVENOUS | Status: DC
  Administered 2017-06-14 – 2017-06-15 (×3): 100 ug/h via INTRAVENOUS
  Administered 2017-06-16: 75 ug/h via INTRAVENOUS
  Filled 2017-06-14 (×4): qty 250

## 2017-06-14 MED ORDER — FENTANYL CITRATE (PF) 100 MCG/2ML IJ SOLN
50.0000 ug | INTRAMUSCULAR | Status: AC | PRN
  Administered 2017-06-14 (×3): 50 ug via INTRAVENOUS
  Filled 2017-06-14 (×3): qty 2

## 2017-06-14 MED ORDER — MIDAZOLAM HCL 2 MG/2ML IJ SOLN
1.0000 mg | INTRAMUSCULAR | Status: DC | PRN
  Administered 2017-06-14 – 2017-06-17 (×9): 1 mg via INTRAVENOUS
  Filled 2017-06-14 (×10): qty 2

## 2017-06-14 MED ORDER — ORAL CARE MOUTH RINSE
15.0000 mL | OROMUCOSAL | Status: DC
  Administered 2017-06-14 – 2017-06-25 (×105): 15 mL via OROMUCOSAL

## 2017-06-14 MED ORDER — CHLORHEXIDINE GLUCONATE CLOTH 2 % EX PADS
6.0000 | MEDICATED_PAD | Freq: Every morning | CUTANEOUS | Status: DC
  Administered 2017-06-14 – 2017-06-24 (×12): 6 via TOPICAL

## 2017-06-14 MED ORDER — ETOMIDATE 2 MG/ML IV SOLN
20.0000 mg | Freq: Once | INTRAVENOUS | Status: AC
  Administered 2017-06-14: 20 mg via INTRAVENOUS

## 2017-06-14 MED ORDER — ORAL CARE MOUTH RINSE
15.0000 mL | Freq: Four times a day (QID) | OROMUCOSAL | Status: DC
  Administered 2017-06-14: 15 mL via OROMUCOSAL

## 2017-06-14 MED ORDER — FENTANYL BOLUS VIA INFUSION
25.0000 ug | INTRAVENOUS | Status: DC | PRN
  Administered 2017-06-18: 100 ug via INTRAVENOUS
  Administered 2017-06-19: 25 ug via INTRAVENOUS
  Filled 2017-06-14: qty 25

## 2017-06-14 MED ORDER — METOCLOPRAMIDE HCL 5 MG/ML IJ SOLN
10.0000 mg | Freq: Four times a day (QID) | INTRAMUSCULAR | Status: AC
  Administered 2017-06-14 – 2017-06-19 (×19): 10 mg via INTRAVENOUS
  Filled 2017-06-14 (×20): qty 2

## 2017-06-14 NOTE — Procedures (Signed)
Intubation Procedure Note Elizardo Chilson [id number] 01-31-1941  Procedure: Intubation Indications: Respiratory insufficiency  Procedure Details Consent: Risks of procedure as well as the alternatives and risks of each were explained to the (patient/caregiver).  Consent for procedure obtained. Time Out: Verified patient identification, verified procedure, site/side was marked, verified correct patient position, special equipment/implants available, medications/allergies/relevent history reviewed, required imaging and test results available.  Performed Presedated with Fentanyl 163mg, Versed 265m and Etomidate 20 mg.  Maximum sterile technique was used including gloves, gown, hand hygiene and mask.  MAC and 4 grade I view.  8 ETT placed secured at 25 cm at lip, +ETCO2 change, equal bilateral rhonchi, absent epigastric sounds.   Evaluation Hemodynamic Status: BP stable throughout; O2 sats: transiently fell during during procedure Patient's Current Condition: stable Complications: No apparent complications Patient did tolerate procedure well. Chest X-ray ordered to verify placement.  CXR: pending.  Procedure performed under direct supervision of Dr. ByLamonte Sakai BrKennieth RadAGACNP-BC [facility] Pgr: [phone] if no answer [phone]/01/2017, 10:42 AM

## 2017-06-14 NOTE — Progress Notes (Signed)
Chaplain stopped in and introduced herself to this family.  Family concerned appropriately for their loved one.  Family express continuing to think positive because they know things can change quickly saying "we are very hopeful".  Chaplain allows family to share their thoughts and make meaning of this event.  One of the family members present is a Engineer, civil (consulting)nurse and they continue to thank her for being present to help them understand medical terminology.      06/14/17 1444  Clinical Encounter Type  Visited With Patient and family together  Visit Type Initial;Spiritual support;Social support

## 2017-06-14 NOTE — Progress Notes (Signed)
[facility]   Name: Elam DutchRobert Shoaf MRN: [id number] DOB: Feb 24, 1941    ADMISSION DATE:  05/16/2017 CONSULTATION DATE:  06/11/2017  REFERRING MD:  Morton PetersVan Tright  CHIEF COMPLAINT:  Respiratory Decompensation/ vent management  Brief Patient Summary:   76 year old obese former smoker with OSA , and insulin-dependent diabetes [place] [facility] [place] He presented to the emergency department there with 2 weeks of progressive shortness of breath ankle swelling and chest discomfort. He had nonspecific EKG changes but was in sinus rhythm. Cardiac enzymes were mildly positive and chest x-ray showed mild CHF. He was transferred to this hospital. His creatinine was 2. Echocardiogram showed EF of 45%. No significant MR. Creatinine improved to 1.7 and he underwent coronary angiography and right heart cath. Coronary showed a 90% left main stenosis, proximal 80-90% LAD stenosis, 90% stenosis of the mid RCA and 80% stenosis of the proximal circumflex. LVEDP is 12. Right heart cath showed cardiac output of 5.5 L/m with normal right-sided pressures. The patient was admitted to the ICU placed on IV heparin and nitroglycerin. His creatinine post cath remains at 1.7.  Blood sugars are fairly well controlled.Patient underwent CABG x 3 with TEE using Left internal mammary artery and right saphenous leg vein on  05/23/2017. He was recovering in the CVICU, was extubated 06/10/2017. He refused CPAP the night of 7/30-7/31> He was in atrial fibrillation and when bolused with Amio he had a rhythm change with long pauses and significant change in mental status requiring emergent re-intubation per Dr. Delton CoombesByrum 7/31. ABG showed a metabolic acidosis.( 7.311/ 41.5/390/22/ 21.1)  CCM consulted for vent management and evaluation of respiratory decompensation.   SUBJECTIVE:  Remains on BiPAP, per RN attempted to trial off BiPAP again and quickly destated into the 80's with increased WOB   Patient denies SOB or pain Frequent ectopy yesterday requiring lido and amio bolus CRRT started 8/2, NF at 19600ml/hr  VITAL SIGNS: BP (!) 82/39   Pulse 86   Temp (!) 97 F (36.1 C)   Resp (!) 27   Ht 5\' 8"  (1.727 m)   Wt 281 lb 15.5 oz (127.9 kg)   SpO2 93%   BMI 42.87 kg/m   HEMODYNAMICS: PAP: (36-73)/(13-49) 45/25 CO:  [6.7 L/min-7.8 L/min] 7.3 L/min CI:  [3 L/min/m2-3.5 L/min/m2] 3.3 L/min/m2  VENTILATOR SETTINGS: Vent Mode: BIPAP;PSV FiO2 (%):  [40 %-55 %] 50 % PEEP:  [5 cmH20] 5 cmH20 Pressure Support:  [10 cmH20] 10 cmH20 Plateau Pressure:  [29 cmH20] 29 cmH20  INTAKE / OUTPUT: I/O last 3 completed shifts: In: 4223.4 [I.V.:3260.4; Blood:315; IV Piggyback:648] Out: 3372 [Urine:380; WUJWJ:1914Other:2532; Chest Tube:460]  PHYSICAL EXAMINATION: General:  Adult male in bed in NAD HEENT: PERRL, thick neck, bilateral IJ accessed Neuro: Awake, oriented, MAE CV: IRIR, distant, pericardial wires and CT x2  remain PULM: even/non-labored on MV, mildly tachypneic, lungs bilaterally coarse, diffuse exp wheeze, diminished in bases GI: obese, soft, non-tender, bs active  Extremities: cool/dry, generalized edema  Skin: no rashes, midline chest dressing intact  LABS:  BMET  Recent Labs Lab 06/13/17 0344 06/13/17 1557 06/14/17 0342  NA 131* 130* 132*  K 4.1 4.1 4.2  CL 98* 97* 101  CO2 22 21* 23  BUN 47* 49* 38*  CREATININE 4.26* 4.10* 3.18*  GLUCOSE 118* 97 90    Electrolytes  Recent Labs Lab 06/12/17 0905  06/13/17 0344 06/13/17 1557 06/14/17 0342  CALCIUM 7.1*  < > 7.2* 7.2* 7.5*  MG 2.5*  --  2.6*  --  2.5*  PHOS 3.6  --  4.0 4.4 3.5  < > = values in this interval not displayed.  CBC  Recent Labs Lab 06/12/17 1047 06/13/17 0344 06/14/17 0342  WBC 23.8* 21.2* 20.2*  HGB 8.1* 9.0* 8.6*  HCT 24.1* 26.3* 25.2*  PLT 180 195 194    Coag's  Recent Labs Lab 06/08/17 0725 06/08/17 1304 06/03/2017 1516  APTT  --  185* 42*  INR 1.18 1.20 1.47     Sepsis Markers  Recent Labs Lab 06/12/17 0905 06/12/17 1832  LATICACIDVEN 2.5* 1.4    ABG  Recent Labs Lab 06/13/17 1610 06/13/17 1741 06/14/17 0302  PHART 7.303* 7.294* 7.304*  PCO2ART 40.3 43.5 46.6  PO2ART 70.0* 96.0 68.9*    Liver Enzymes  Recent Labs Lab 06/12/17 1047 06/13/17 0344 06/13/17 1557 06/14/17 0342  AST 38 39  --  36  ALT 25 25  --  22  ALKPHOS 58 79  --  100  BILITOT 1.0 0.8  --  1.0  ALBUMIN 2.1* 2.5* 2.5* 2.7*    Cardiac Enzymes No results for input(s): TROPONINI, PROBNP in the last 168 hours.  Glucose  Recent Labs Lab 06/14/17 0005 06/14/17 0106 06/14/17 0203 06/14/17 0310 06/14/17 0356 06/14/17 0500  GLUCAP 103* 102* 90 101* 91 94    Imaging Dg Chest Port 1 View  Result Date: 06/13/2017 CLINICAL DATA:  Evaluate central line placed EXAM: PORTABLE CHEST 1 VIEW COMPARISON:  Portable chest x-ray of 06/13/2017 FINDINGS: A central venous line has been inserted from the left with the tip overlying the lateral mid SVC. No pneumothorax is seen. Swan-Ganz catheter and left chest tube remain and the lungs are not well aerated. Cardiomegaly is stable. Pulmonary vascular congestion cannot be excluded. IMPRESSION: 1. New left central venous line tip overlies the lateral mid SVC. No pneumothorax. 2. No change in poor aeration. Possible mild pulmonary vascular congestion. Electronically Signed   By: Dwyane DeePaul  Barry M.D.   On: 06/13/2017 14:56    STUDIES:  Echo 06/06/2017>>EF 40-45%, Moderate LVH, Mildly to moderately  reduced systolic dysfunction, MV mild regurgitation, LA mildly dilated 7/27 LHC/RHC >>90% left main stenosis, proximal 80-90% LAD stenosis, 90% stenosis of the mid RCA and 80% stenosis of the proximal circumflex. LVEDP is 12, cardiac output of 5.5 L/m with normal right-sided pressures.  CULTURES: 7/27 MRSA PCR >> positive Sputum 7/31>> multiple organism present, none predominant  ANTIBIOTICS: Ceftazidine 7/31>> Zinacef 7/29  preop  SIGNIFICANT EVENTS: 05/15/2017>> CABG 06/10/2017>> Extubation 06/11/2017>> Emergent re-intubation for mixed respiratory/metabolic acidosis 06/12/2017 > self extubated -> BiPAP 06/13/17 > CRRT  LINES/TUBES: R IJ Swan 7/29 >> Left and Mediastinal CT>> 7/29 R Radial A Line 7/29 >>8/1 ETT 7/31 >> 8/1 Left femoral Aline 8/1 > L IJ HD cath 8/2 >>  DISCUSSION: Pt. Post CABG 7/29, extubated 7/30, developed mixed respiratory / metabolic acidosis 7/31 am after refusing CPAP pm of 7/30, with suspected hypoperfusion 2/2 artrial fibrillation/  rhythm change ( blocked down with Amio bolus). Additionally worsening renal function is a contributing factor. Dr. Delton CoombesByrum emergently intubated the patient 7/31. ABG is improving on mechanical ventilation. Rhythm is now intermittently paced. We will check lactic acid and  rest on vent 7/31 and re-evaluate in am regarding readiness to wean/ extubate after ABG and trending lactates.PCCM to follow.   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    - Inter-disciplinary family meet or Palliative Care meeting due by: 06/21/17.   CCT 30 mins  Posey Boyer, AGACNP-BC [facility] Pgr: [phone] or if no answer [phone] 06/14/2017, 7:10 AM   Attending Note:  I have examined patient, reviewed labs, studies and notes. I have discussed the case with B Simpson, and I agree with the data and plans as amended above. 76 year old obese man with diabetes, coronary disease who underwent urgent CABG on 7/29. Course complicated by acute renal failure, total body volume overload, respiratory failure. Also atrial fibrillation with some significant pauses and ectopy. Since extubation he has been BiPAP dependent, continues to be in shock on norepinephrine, inotropes, dopamine. CVVH has been initiated for volume removal which she appears to be tolerating. He has experienced progressive increase in his work of breathing, appears to be tolerating BiPAP less effectively. He will need to be electively reintubated today to support adequately while we try to optimize his volume status. Remains on empiric ceftazidime for possible HCAP. We will collect a sputum culture when he is reintubated. Low threshold to add vancomycin to broaden average.  Independent critical care time is 35 minutes.   Levy Pupa, MD, PhD 06/14/2017, 11:57 AM [facility] [phone] or if no answer [phone]

## 2017-06-14 NOTE — Progress Notes (Signed)
5 Days Post-Op Procedure(s) (LRB): CORONARY ARTERY BYPASS GRAFTING (CABG) x3 WITH TEE USING LEFT INTERNAL MAMMARY ARTERY AND RIGHT  SAPHENOUS LEG VEIN HARVESTED ENDOSCOPICALLY (N/A) Subjective: Urgent CABG 3 for left main stenosis, critical RCA stenosis, unstable angina Preoperative diabetes, sleep apnea, morbid obesity, chronic renal failure, hypertension Postop acute on chronic renal failure, fluid retention with pulmonary edema and probable pneumonia  Currently on CVVH. Patient intubated from BiPAP today due to increased respiratory rate. Secretions are purulent with gram-positive gram-negative cocci. Patient currently on Fortaz. Patient on controlled atrial fibrillation with the backup on epicardial pacemaker Objective: Vital signs in last 24 hours: Temp:  [96.1 F (35.6 C)-99 F (37.2 C)] 99 F (37.2 C) (08/03 1800) Pulse Rate:  [25-108] 83 (08/03 1800) Cardiac Rhythm: Atrial fibrillation (08/03 0800) Resp:  [11-45] 22 (08/03 1800) BP: (78-159)/(26-46) 123/44 (08/03 1526) SpO2:  [73 %-100 %] 98 % (08/03 1800) Arterial Line BP: (97-157)/(36-54) 116/40 (08/03 1800) FiO2 (%):  [21 %-100 %] 60 % (08/03 1526) Weight:  [281 lb 15.5 oz (127.9 kg)] 281 lb 15.5 oz (127.9 kg) (08/03 0500)  Hemodynamic parameters for last 24 hours: PAP: (36-70)/(15-49) 39/35 CO:  [6.9 L/min-7.8 L/min] 7.3 L/min CI:  [3.1 L/min/m2-3.5 L/min/m2] 3.2 L/min/m2  Intake/Output from previous day: 08/02 0701 - 08/03 0700 In: 2546.9 [I.V.:2130.9; IV Piggyback:416] Out: 3062 [Urine:200; Chest Tube:330] Intake/Output this shift: Total I/O In: 1195.2 [I.V.:925.2; NG/GT:120; IV Piggyback:150] Out: 2078 [Urine:30; WUJWJ:1914Other:1798; Chest Tube:250]       Exam    General- sedated on vent   Lungs- coarse breath sounds   Cor- atrial fibrillation, no murmur   Abdomen, obese nontender    Extremities - warm, non-tender, minimal edema   Neuro- sedated on vent, no focal weakness   Lab Results:  Recent Labs  06/13/17 0344 06/14/17 0342  WBC 21.2* 20.2*  HGB 9.0* 8.6*  HCT 26.3* 25.2*  PLT 195 194   BMET:  Recent Labs  06/14/17 0342 06/14/17 1605  NA 132* 133*  K 4.2 4.6  CL 101 100*  CO2 23 22  GLUCOSE 90 251*  BUN 38* 32*  CREATININE 3.18* 2.80*  CALCIUM 7.5* 7.5*    PT/INR: No results for input(s): LABPROT, INR in the last 72 hours. ABG    Component Value Date/Time   PHART 7.369 06/14/2017 1735   HCO3 21.3 06/14/2017 1735   TCO2 22 06/14/2017 1735   ACIDBASEDEF 4.0 (H) 06/14/2017 1735   O2SAT 97.0 06/14/2017 1735   CBG (last 3)   Recent Labs  06/14/17 0803 06/14/17 1134 06/14/17 1612  GLUCAP 152* 180* 237*    Assessment/Plan: S/P Procedure(s) (LRB): CORONARY ARTERY BYPASS GRAFTING (CABG) x3 WITH TEE USING LEFT INTERNAL MAMMARY ARTERY AND RIGHT  SAPHENOUS LEG VEIN HARVESTED ENDOSCOPICALLY (N/A) Diabetes control Continue fluid removal a CVVH  Patient will need to be on ventilator until volume status can be improved   cardiac status stable with good cardiac output, on  norepinephrine to maintain SVR-mean arterial pressure while on sedation   LOS: 9 days    Kathlee Nationseter Van Trigt III 06/14/2017

## 2017-06-14 NOTE — Progress Notes (Signed)
      [address]       [place] [id number]             [phone]     POD # 5 emergency CABG  Intubated, sedated, on CVVH  BP (!) 123/44   Pulse 83   Temp 99 F (37.2 C)   Resp (!) 22   Ht 5\' 8"  (1.727 m)   Wt 281 lb 15.5 oz (127.9 kg)   SpO2 98%   BMI 42.87 kg/m   PA 39/28 on dopamine at 3, milrinone at 0.25 Co-ox= 74  Intake/Output Summary (Last 24 hours) at 06/14/17 1851 Last data filed at 06/14/17 1800  Gross per 24 hour  Intake          2423.01 ml  Output             4621 ml  Net         -2197.99 ml   Continue current care  Trea Latner C. Dorris FetchHendrickson, MD [facility] [phone]

## 2017-06-14 NOTE — Progress Notes (Signed)
Subjective: On bipap, following commands, states he feels like he can not breath, currently on CRRT. Only able to remove total of 500 ccs since starting- even though ABG is reasonable, says he "cant breathe"  Objective Vital signs in last 24 hours: Vitals:   06/14/17 0645 06/14/17 0700 06/14/17 0715 06/14/17 0737  BP:  (!) 82/39  (!) 159/41  Pulse: 87 86 96 79  Resp: (!) 27 (!) 27 (!) 29 (!) 27  Temp: (!) 97 F (36.1 C) (!) 97 F (36.1 C) (!) 97 F (36.1 C)   TempSrc:      SpO2: 94% 93% 96% 95%  Weight:      Height:       Weight change: -10.6 oz (-0.3 kg)  Intake/Output Summary (Last 24 hours) at 06/14/17 0843 Last data filed at 06/14/17 0800  Gross per 24 hour  Intake             2396 ml  Output             3187 ml  Net             -791 ml    Assessment/Plan: 76 year old male with diabetes status post recent MI and postop  from CABG (7/29) with acute on chronic renal failure 1.Renal- acute on chronic renal failure. Urinalysis previously bland given the scenario is likely ATN from hemodynamic cause. Pattient started CRRT yesterday.  Still volume overloaded, will try and remove more fluid as patient's blood pressure allows.  2. Hypertension/volume  - Patient is tolerating CRRT well, will continue as patient is still volume overloaded   3. Elytes- potassium okay, sodium down likely secondary to volume- phos is decreasing , will follow 4. Anemia  - situational secondary to surgery- supportive care and transfuse as needed, Hgb currently stable at 8.6- will go ahead and add ESA  5. Pulm- Continues to be on bipap, will likely be re-intubated.       Boyd Kerbs  PGY-2 Internal Medicine Pager: [phone]  Patient seen and examined, agree with above note with above modifications. Seems to be tiring on bipap- trying to remove volume but will likely be a quick enough process to avoid reintubation.  CRRT running well even with no heparin.  Will add ESA for hgb in the 8's Corliss Parish, MD 06/14/2017        Labs: Basic Metabolic Panel:  Recent Labs Lab 06/13/17 0344 06/13/17 1557 06/14/17 0342  NA 131* 130* 132*  K 4.1 4.1 4.2  CL 98* 97* 101  CO2 22 21* 23  GLUCOSE 118* 97 90  BUN 47* 49* 38*  CREATININE 4.26* 4.10* 3.18*  CALCIUM 7.2* 7.2* 7.5*  PHOS 4.0 4.4 3.5   Liver Function Tests:  Recent Labs Lab 06/12/17 1047 06/13/17 0344 06/13/17 1557 06/14/17 0342  AST 38 39  --  36  ALT 25 25  --  22  ALKPHOS 58 79  --  100  BILITOT 1.0 0.8  --  1.0  PROT 4.6* 5.1*  --  5.2*  ALBUMIN 2.1* 2.5* 2.5* 2.7*   No results for input(s): LIPASE, AMYLASE in the last 168 hours. No results for input(s): AMMONIA in the last 168 hours. CBC:  Recent Labs Lab 06/10/17 1615  06/11/17 0500  06/12/17 1047 06/13/17 0344 06/14/17 0342  WBC 27.2*  --  30.3*  --  23.8* 21.2* 20.2*  HGB 9.0*  < > 8.5*  < > 8.1* 9.0* 8.6*  HCT 27.7*  < >  26.0*  < > 24.1* 26.3* 25.2*  MCV 87.7  --  87.5  --  85.2 84.8 84.0  PLT 193  --  191  --  180 195 194  < > = values in this interval not displayed. Cardiac Enzymes: No results for input(s): CKTOTAL, CKMB, CKMBINDEX, TROPONINI in the last 168 hours. CBG:  Recent Labs Lab 06/14/17 0203 06/14/17 0310 06/14/17 0356 06/14/17 0500 06/14/17 0803  GLUCAP 90 101* 91 94 152*    Iron Studies: No results for input(s): IRON, TIBC, TRANSFERRIN, FERRITIN in the last 72 hours. Studies/Results: Dg Chest Port 1 View  Result Date: 06/14/2017 CLINICAL DATA:  Shortness of breath.  Chest tube.  CABG. EXAM: PORTABLE CHEST 1 VIEW COMPARISON:  06/13/2017. FINDINGS: Swan-Ganz catheter, left IJ catheter, left chest tube in stable position. Prior CABG. Cardiomegaly. Left base infiltrate noted.  No pleural effusion or pneumothorax . IMPRESSION: 1. Lines and tubes in stable position. 2.  Left base infiltrate noted on today's exam. 3. Prior CABG.  Stable cardiomegaly . Electronically Signed   By: Marcello Moores  Register   On: 06/14/2017  07:32   Dg Chest Port 1 View  Result Date: 06/13/2017 CLINICAL DATA:  Evaluate central line placed EXAM: PORTABLE CHEST 1 VIEW COMPARISON:  Portable chest x-ray of 06/13/2017 FINDINGS: A central venous line has been inserted from the left with the tip overlying the lateral mid SVC. No pneumothorax is seen. Swan-Ganz catheter and left chest tube remain and the lungs are not well aerated. Cardiomegaly is stable. Pulmonary vascular congestion cannot be excluded. IMPRESSION: 1. New left central venous line tip overlies the lateral mid SVC. No pneumothorax. 2. No change in poor aeration. Possible mild pulmonary vascular congestion. Electronically Signed   By: Ivar Drape M.D.   On: 06/13/2017 14:56   Dg Chest Port 1 View  Result Date: 06/13/2017 CLINICAL DATA:  Shortness of breath, respiratory failure. EXAM: PORTABLE CHEST 1 VIEW COMPARISON:  Multiple prior chest x-rays, most recently dated yesterday. FINDINGS: Right internal jugular Swan-Ganz catheter is unchanged in position. Chest and mediastinal tubes are also in unchanged position. Interval removal of endotracheal and enteric tubes. Prior CABG. Cardiomegaly and diffuse patchy opacities throughout both lungs are similar to prior study. Low lung volumes. Small bilateral pleural effusions are unchanged. No pneumothorax. No acute osseous abnormality. IMPRESSION: Unchanged pulmonary vascular congestion and interstitial edema. Electronically Signed   By: Titus Dubin M.D.   On: 06/13/2017 07:25   Medications: Infusions: . sodium chloride Stopped (06/12/17 1900)  . sodium chloride Stopped (06/10/17 0700)  . sodium chloride 20 mL/hr at 06/13/17 1800  . albumin human    . amiodarone 30 mg/hr (06/14/17 0000)  . cefTAZidime (FORTAZ)  IV Stopped (06/13/17 2138)  . DOPamine 4 mcg/kg/min (06/13/17 1800)  . lactated ringers    . lactated ringers 10 mL/hr at 06/13/17 1649  . milrinone 0.25 mcg/kg/min (06/14/17 0111)  . norepinephrine (LEVOPHED) Adult infusion  28 mcg/min (06/14/17 0411)  . dialysis replacement fluid (prismasate) 300 mL/hr at 06/14/17 0818  . dialysis replacement fluid (prismasate) 300 mL/hr at 06/14/17 0840  . dialysate (PRISMASATE) 1,500 mL/hr at 06/14/17 0817  . sodium chloride      Scheduled Medications: . acetaminophen  1,000 mg Oral Q6H   Or  . acetaminophen (TYLENOL) oral liquid 160 mg/5 mL  1,000 mg Per Tube Q6H  . aspirin EC  325 mg Oral Daily   Or  . aspirin  324 mg Per Tube Daily  . atorvastatin  80  mg Oral q1800  . bisacodyl  10 mg Oral Daily   Or  . bisacodyl  10 mg Rectal Daily  . chlorhexidine gluconate (MEDLINE KIT)  15 mL Mouth Rinse BID  . Chlorhexidine Gluconate Cloth  6 each Topical q morning - 10a  . docusate sodium  200 mg Oral Daily  . enoxaparin (LOVENOX) injection  30 mg Subcutaneous Daily  . insulin aspart  0-24 Units Subcutaneous Q4H  . levalbuterol  0.63 mg Nebulization Q6H  . mouth rinse  15 mL Mouth Rinse QID  . metoCLOPramide (REGLAN) injection  10 mg Intravenous Q6H  . metoprolol tartrate  12.5 mg Oral BID   Or  . metoprolol tartrate  12.5 mg Per Tube BID  . pantoprazole (PROTONIX) IV  40 mg Intravenous QHS  . sodium chloride flush  10-40 mL Intracatheter Q12H  . sodium chloride flush  3 mL Intravenous Q12H    have reviewed scheduled and prn medications.  Physical Exam: General: Follows commands, on bipap Heart: RRR Lungs: rhonchi throughout Abdomen: obese, non tender Extremities: pitting edema in upper and lower extremities bilaterally    06/14/2017,8:43 AM  LOS: 9 days

## 2017-06-14 NOTE — Progress Notes (Signed)
PAP port and Proximal Injectate unable to draw back blood for evening Co-Ox. Vasoactive infusions running through introducer. Left IJ HD pigtail catheter used to obtain Co-Ox.

## 2017-06-14 NOTE — Progress Notes (Signed)
PCCM Interval Progress Note  Called by RN for increasing agitation this morning, patient pulling at mask and wanting to be off BiPAP, did not want to be reintubated, and able to answer questions appropriately.  However, while off, he quickly destats with increased WOB, audible rales.  Wife and daughter at bedside and want all interventions including intubation.  Patient now back on BiPAP, lethargic and unable to answer questions approprietly or remain awake for conversation.  Family request we proceed with intubation and that he remains a full code.   P:  Will intubate now Full vent support PAD protocol VAP protocol CXR post intubation ABG in 1 hour   Add CCT 30 spent at bedside and in conversation with family  Philip BoyerBrooke Love, AGACNP-BC [facility] Pgr: [phone] or if no answer [phone] 06/14/2017, 10:20 AM

## 2017-06-15 ENCOUNTER — Inpatient Hospital Stay (HOSPITAL_COMMUNITY): Payer: Medicare Other

## 2017-06-15 LAB — CBC
HCT: 22.5 % — ABNORMAL LOW (ref 39.0–52.0)
Hemoglobin: 7.6 g/dL — ABNORMAL LOW (ref 13.0–17.0)
MCH: 28.3 pg (ref 26.0–34.0)
MCHC: 33.8 g/dL (ref 30.0–36.0)
MCV: 83.6 fL (ref 78.0–100.0)
Platelets: 175 10*3/uL (ref 150–400)
RBC: 2.69 MIL/uL — ABNORMAL LOW (ref 4.22–5.81)
RDW: 15.3 % (ref 11.5–15.5)
WBC: 16.3 10*3/uL — ABNORMAL HIGH (ref 4.0–10.5)

## 2017-06-15 LAB — COMPREHENSIVE METABOLIC PANEL
ALT: 17 U/L (ref 17–63)
AST: 31 U/L (ref 15–41)
Albumin: 2.8 g/dL — ABNORMAL LOW (ref 3.5–5.0)
Alkaline Phosphatase: 114 U/L (ref 38–126)
Anion gap: 8 (ref 5–15)
BUN: 27 mg/dL — ABNORMAL HIGH (ref 6–20)
CO2: 24 mmol/L (ref 22–32)
Calcium: 7.4 mg/dL — ABNORMAL LOW (ref 8.9–10.3)
Chloride: 102 mmol/L (ref 101–111)
Creatinine, Ser: 2.46 mg/dL — ABNORMAL HIGH (ref 0.61–1.24)
GFR calc Af Amer: 28 mL/min — ABNORMAL LOW (ref >60)
GFR calc non Af Amer: 24 mL/min — ABNORMAL LOW (ref >60)
Glucose, Bld: 157 mg/dL — ABNORMAL HIGH (ref 65–99)
Potassium: 3.9 mmol/L (ref 3.5–5.1)
Sodium: 134 mmol/L — ABNORMAL LOW (ref 135–145)
Total Bilirubin: 1.1 mg/dL (ref 0.3–1.2)
Total Protein: 5.3 g/dL — ABNORMAL LOW (ref 6.5–8.1)

## 2017-06-15 LAB — RENAL FUNCTION PANEL
ANION GAP: 8 (ref 5–15)
Albumin: 2.9 g/dL — ABNORMAL LOW (ref 3.5–5.0)
Albumin: 2.8 g/dL — ABNORMAL LOW (ref 3.5–5.0)
Anion gap: 11 (ref 5–15)
BUN: 27 mg/dL — ABNORMAL HIGH (ref 6–20)
BUN: 20 mg/dL (ref 6–20)
CHLORIDE: 100 mmol/L — AB (ref 101–111)
CO2: 23 mmol/L (ref 22–32)
CO2: 23 mmol/L (ref 22–32)
Calcium: 7.5 mg/dL — ABNORMAL LOW (ref 8.9–10.3)
Calcium: 7.5 mg/dL — ABNORMAL LOW (ref 8.9–10.3)
Chloride: 100 mmol/L — ABNORMAL LOW (ref 101–111)
Creatinine, Ser: 2.45 mg/dL — ABNORMAL HIGH (ref 0.61–1.24)
Creatinine, Ser: 2.17 mg/dL — ABNORMAL HIGH (ref 0.61–1.24)
GFR calc non Af Amer: 25 mL/min — ABNORMAL LOW (ref >60)
GFR, EST AFRICAN AMERICAN: 28 mL/min — AB (ref >60)
GFR, EST AFRICAN AMERICAN: 33 mL/min — AB (ref >60)
GFR, EST NON AFRICAN AMERICAN: 28 mL/min — AB (ref >60)
GLUCOSE: 154 mg/dL — AB (ref 65–99)
Glucose, Bld: 157 mg/dL — ABNORMAL HIGH (ref 65–99)
PHOSPHORUS: 2.8 mg/dL (ref 2.5–4.6)
POTASSIUM: 3.9 mmol/L (ref 3.5–5.1)
POTASSIUM: 3.8 mmol/L (ref 3.5–5.1)
Phosphorus: 2.3 mg/dL — ABNORMAL LOW (ref 2.5–4.6)
SODIUM: 131 mmol/L — AB (ref 135–145)
SODIUM: 134 mmol/L — AB (ref 135–145)

## 2017-06-15 LAB — POCT I-STAT 3, ART BLOOD GAS (G3+)
Acid-Base Excess: 1 mmol/L (ref 0.0–2.0)
Acid-base deficit: 1 mmol/L (ref 0.0–2.0)
BICARBONATE: 25.1 mmol/L (ref 20.0–28.0)
BICARBONATE: 23.7 mmol/L (ref 20.0–28.0)
O2 Saturation: 97 %
O2 Saturation: 97 %
PCO2 ART: 37.5 mmHg (ref 32.0–48.0)
PCO2 ART: 36.7 mmHg (ref 32.0–48.0)
PH ART: 7.418 (ref 7.350–7.450)
PO2 ART: 90 mmHg (ref 83.0–108.0)
PO2 ART: 87 mmHg (ref 83.0–108.0)
Patient temperature: 36.7
TCO2: 26 mmol/L (ref 0–100)
TCO2: 25 mmol/L (ref 0–100)
pH, Arterial: 7.433 (ref 7.350–7.450)

## 2017-06-15 LAB — COOXEMETRY PANEL
Carboxyhemoglobin: 1 % (ref 0.5–1.5)
Methemoglobin: 1.6 % — ABNORMAL HIGH (ref 0.0–1.5)
O2 Saturation: 73.4 %
Total hemoglobin: 5.6 g/dL — CL (ref 12.0–16.0)

## 2017-06-15 LAB — GLUCOSE, CAPILLARY
GLUCOSE-CAPILLARY: 153 mg/dL — AB (ref 65–99)
Glucose-Capillary: 216 mg/dL — ABNORMAL HIGH (ref 65–99)
Glucose-Capillary: 164 mg/dL — ABNORMAL HIGH (ref 65–99)
Glucose-Capillary: 108 mg/dL — ABNORMAL HIGH (ref 65–99)

## 2017-06-15 LAB — MAGNESIUM: Magnesium: 2.5 mg/dL — ABNORMAL HIGH (ref 1.7–2.4)

## 2017-06-15 MED ORDER — AMIODARONE HCL IN DEXTROSE 360-4.14 MG/200ML-% IV SOLN
30.0000 mg/h | INTRAVENOUS | Status: DC
  Administered 2017-06-15 – 2017-06-19 (×7): 30 mg/h via INTRAVENOUS
  Filled 2017-06-15 (×7): qty 200

## 2017-06-15 MED ORDER — AMIODARONE HCL IN DEXTROSE 360-4.14 MG/200ML-% IV SOLN
INTRAVENOUS | Status: AC
  Filled 2017-06-15: qty 400

## 2017-06-15 MED ORDER — AMIODARONE LOAD VIA INFUSION
150.0000 mg | Freq: Once | INTRAVENOUS | Status: AC
  Administered 2017-06-15: 150 mg via INTRAVENOUS
  Filled 2017-06-15: qty 83.34

## 2017-06-15 MED ORDER — SODIUM PHOSPHATES 45 MMOLE/15ML IV SOLN
30.0000 mmol | Freq: Once | INTRAVENOUS | Status: AC
  Administered 2017-06-15: 30 mmol via INTRAVENOUS
  Filled 2017-06-15: qty 10

## 2017-06-15 MED ORDER — AMIODARONE HCL IN DEXTROSE 360-4.14 MG/200ML-% IV SOLN
60.0000 mg/h | INTRAVENOUS | Status: AC
  Administered 2017-06-15 (×2): 60 mg/h via INTRAVENOUS

## 2017-06-15 MED FILL — Medication: Qty: 1 | Status: AC

## 2017-06-15 NOTE — Progress Notes (Signed)
      [address]       [place], [id number]             [phone]      Intubated and sedated  BP (!) 105/36   Pulse 87   Temp 97.9 F (36.6 C)   Resp (!) 23   Ht 5\' 8"  (1.727 m)   Wt 273 lb 13 oz (124.2 kg)   SpO2 91%   BMI 41.63 kg/m    Intake/Output Summary (Last 24 hours) at 06/15/17 1716 Last data filed at 06/15/17 1700  Gross per 24 hour  Intake          3307.73 ml  Output             4875 ml  Net         -1567.27 ml   No new issues  Continue present care  Viviann SpareSteven C. Dorris FetchHendrickson, MD [facility] [phone]

## 2017-06-15 NOTE — Progress Notes (Signed)
6 Days Post-Op Procedure(s) (LRB): CORONARY ARTERY BYPASS GRAFTING (CABG) x3 WITH TEE USING LEFT INTERNAL MAMMARY ARTERY AND RIGHT  SAPHENOUS LEG VEIN HARVESTED ENDOSCOPICALLY (N/A) Subjective: Intubated, sedated  Objective: Vital signs in last 24 hours: Temp:  [96.1 F (35.6 C)-99 F (37.2 C)] 98.1 F (36.7 C) (08/04 0800) Pulse Rate:  [25-111] 90 (08/04 0800) Cardiac Rhythm: Atrial fibrillation (08/04 0800) Resp:  [0-26] 23 (08/04 0800) BP: (86-127)/(30-44) 115/41 (08/04 0751) SpO2:  [73 %-100 %] 93 % (08/04 0800) Arterial Line BP: (104-144)/(35-51) 113/37 (08/04 0800) FiO2 (%):  [21 %-100 %] 60 % (08/04 0800) Weight:  [273 lb 13 oz (124.2 kg)] 273 lb 13 oz (124.2 kg) (08/04 0515)  Hemodynamic parameters for last 24 hours: PAP: (36-49)/(22-44) 38/34 CO:  [6.6 L/min-8.9 L/min] 8.9 L/min CI:  [3.5 L/min/m2-4 L/min/m2] 4 L/min/m2  Intake/Output from previous day: 08/03 0701 - 08/04 0700 In: 2667.8 [I.V.:1927.8; NG/GT:180; IV Piggyback:560] Out: 4760 [Urine:75; Emesis/NG output:150; Chest Tube:300] Intake/Output this shift: Total I/O In: 108.3 [I.V.:78.3; NG/GT:30] Out: 227 [Other:227]  Neurologic: seadted Heart: irregularly irregular rhythm Lungs: rhonchi bilaterally Abdomen: soft, no apparent tenderness Extremities: edema all 4  Lab Results:  Recent Labs  06/14/17 0342 06/15/17 0317  WBC 20.2* 16.3*  HGB 8.6* 7.6*  HCT 25.2* 22.5*  PLT 194 175   BMET:  Recent Labs  06/14/17 1605 06/15/17 0317  NA 133* 134*  131*  K 4.6 3.9  3.9  CL 100* 102  100*  CO2 22 24  23   GLUCOSE 251* 157*  157*  BUN 32* 27*  27*  CREATININE 2.80* 2.46*  2.45*  CALCIUM 7.5* 7.4*  7.5*    PT/INR: No results for input(s): LABPROT, INR in the last 72 hours. ABG    Component Value Date/Time   PHART 7.433 06/15/2017 0320   HCO3 25.1 06/15/2017 0320   TCO2 26 06/15/2017 0320   ACIDBASEDEF 4.0 (H) 06/14/2017 1735   O2SAT 73.4 06/15/2017 0546   CBG (last 3)   Recent  Labs  06/14/17 2311 06/15/17 0318 06/15/17 0755  GLUCAP 216* 164* 108*    Assessment/Plan: S/P Procedure(s) (LRB): CORONARY ARTERY BYPASS GRAFTING (CABG) x3 WITH TEE USING LEFT INTERNAL MAMMARY ARTERY AND RIGHT  SAPHENOUS LEG VEIN HARVESTED ENDOSCOPICALLY (N/A) -Remains critically ill with respiratory and renal failure  CV- excellent cardiac output  Co-ox in 70s on dopamine, milrinone and levophed  Dopamine may be contributing to atrial fib- will wean off  Atrial fib- restart amiodarone  RESP- VDRF, vent per CCM  RENAL- on CVVH, 2L negative yesterday, still > 13L positive overall  Plan per Nephrology  ENDO- CBG moderately elevated last night, better this AM  SCD and enoxaparin for DVT prophylaxis   LOS: 10 days    Philip Love 06/15/2017

## 2017-06-15 NOTE — Progress Notes (Signed)
[facility]   Name: Philip Love MRN: [id number] DOB: 1941-09-26    ADMISSION DATE:  05/12/2017 CONSULTATION DATE:  06/11/2017  REFERRING MD:  Morton PetersVan Tright  CHIEF COMPLAINT:  Respiratory Decompensation/ vent management  Brief Patient Summary:   76 year old obese former smoker with OSA , and insulin-dependent diabetes [place] [facility] [place] [place] He presented to the emergency department there with 2 weeks of progressive shortness of breath ankle swelling and chest discomfort. He had nonspecific EKG changes but was in sinus rhythm. Cardiac enzymes were mildly positive and chest x-ray showed mild CHF. He was transferred to this hospital. His creatinine was 2. Echocardiogram showed EF of 45%. No significant MR. Creatinine improved to 1.7 and he underwent coronary angiography and right heart cath. Coronary showed a 90% left main stenosis, proximal 80-90% LAD stenosis, 90% stenosis of the mid RCA and 80% stenosis of the proximal circumflex. LVEDP is 12. Right heart cath showed cardiac output of 5.5 L/m with normal right-sided pressures. The patient was admitted to the ICU placed on IV heparin and nitroglycerin. His creatinine post cath remains at 1.7.  Blood sugars are fairly well controlled.Patient underwent CABG x 3 with TEE using Left internal mammary artery and right saphenous leg vein on  08-01-17. He was recovering in the CVICU, was extubated 06/10/2017. He refused CPAP the night of 7/30-7/31> He was in atrial fibrillation and when bolused with Amio he had a rhythm change with long pauses and significant change in mental status requiring emergent re-intubation per Dr. Delton CoombesByrum 7/31. ABG showed a metabolic acidosis.( 7.311/ 41.5/390/22/ 21.1)  CCM consulted for vent management and evaluation of respiratory decompensation.   SUBJECTIVE:  Reintubated 8/3 CVVH successfully UF -2 L over last 24 hours Tachycardia, atrial fibrillation Amiodarone to be  restarted this morning  VITAL SIGNS: BP (!) 115/41   Pulse (!) 115   Temp 97.9 F (36.6 C)   Resp (!) 23   Ht 5\' 8"  (1.727 m)   Wt 124.2 kg (273 lb 13 oz)   SpO2 96%   BMI 41.63 kg/m   HEMODYNAMICS: PAP: (36-49)/(22-44) 41/33 CO:  [6.6 L/min-8.9 L/min] 8.9 L/min CI:  [3.5 L/min/m2-4 L/min/m2] 4 L/min/m2  VENTILATOR SETTINGS: Vent Mode: PRVC FiO2 (%):  [50 %-100 %] 60 % Set Rate:  [18 bmp-22 bmp] 22 bmp Vt Set:  [550 mL] 550 mL PEEP:  [5 cmH20] 5 cmH20 Plateau Pressure:  [22 cmH20-30 cmH20] 30 cmH20  INTAKE / OUTPUT: I/O last 3 completed shifts: In: 3811.5 [I.V.:2921.5; NG/GT:180; IV Piggyback:710] Out: 7158 [Urine:130; Emesis/NG output:150; UVOZD:6644Other:6348; Chest Tube:530]  PHYSICAL EXAMINATION: General:  Obese man, sedated, intubated HEENT: ET tube in place, large neck, oropharynx clear Neuro: Sedated, grimaces with pain, does not wake to voice CV: Irregular, heart rate 90s, pacer wires remain, not currently paced PULM: Coarse bilaterally, some diffuse expiratory wheezing GI: Obese, soft, nontender, bowel sounds present Extremities: Generalized edema Skin: No rash  LABS:  BMET  Recent Labs Lab 06/14/17 0342 06/14/17 1605 06/15/17 0317  NA 132* 133* 134*  131*  K 4.2 4.6 3.9  3.9  CL 101 100* 102  100*  CO2 23 22 24  23   BUN 38* 32* 27*  27*  CREATININE 3.18* 2.80* 2.46*  2.45*  GLUCOSE 90 251* 157*  157*    Electrolytes  Recent Labs Lab 06/13/17 0344  06/14/17 0342 06/14/17 1605 06/15/17 0317  CALCIUM 7.2*  < > 7.5* 7.5* 7.4*  7.5*  MG 2.6*  --  2.5*  --  2.5*  PHOS 4.0  < > 3.5 3.8 2.3*  < > = values in this interval not displayed.  CBC  Recent Labs Lab 06/13/17 0344 06/14/17 0342 06/15/17 0317  WBC 21.2* 20.2* 16.3*  HGB 9.0* 8.6* 7.6*  HCT 26.3* 25.2* 22.5*  PLT 195 194 175    Coag's  Recent Labs Lab 06/08/17 1304 2017-06-13 1516  APTT 185* 42*  INR 1.20 1.47    Sepsis Markers  Recent Labs Lab 06/12/17 0905  06/12/17 1832  LATICACIDVEN 2.5* 1.4    ABG  Recent Labs Lab 06/14/17 1409 06/14/17 1735 06/15/17 0320  PHART 7.326* 7.369 7.433  PCO2ART 39.1 37.1 37.5  PO2ART 84.0 92.0 90.0    Liver Enzymes  Recent Labs Lab 06/13/17 0344  06/14/17 0342 06/14/17 1605 06/15/17 0317  AST 39  --  36  --  31  ALT 25  --  22  --  17  ALKPHOS 79  --  100  --  114  BILITOT 0.8  --  1.0  --  1.1  ALBUMIN 2.5*  < > 2.7* 2.8* 2.8*  2.9*  < > = values in this interval not displayed.  Cardiac Enzymes No results for input(s): TROPONINI, PROBNP in the last 168 hours.  Glucose  Recent Labs Lab 06/14/17 1134 06/14/17 1612 06/14/17 2001 06/14/17 2311 06/15/17 0318 06/15/17 0755  GLUCAP 180* 237* 242* 216* 164* 108*    Imaging Portable Chest Xray  Result Date: 06/15/2017 CLINICAL DATA:  CABG EXAM: PORTABLE CHEST 1 VIEW COMPARISON:  06/14/2017 FINDINGS: Endotracheal tube, Swan-Ganz catheter, mediastinal drain, and NG tube unchanged. Central venous line unchanged. Stable cardiac silhouette. There is diffuse airspace disease in the LEFT lung not changed. LEFT chest tube in place without pneumothorax. RIGHT lung is relatively clear. IMPRESSION: None 1. No significant change. 2. Airspace disease in LEFT lung. 3. Stable support apparatus. Electronically Signed   By: Genevive BiStewart  Edmunds M.D.   On: 06/15/2017 08:23   Portable Chest Xray  Result Date: 06/14/2017 CLINICAL DATA:  Intubated EXAM: PORTABLE CHEST 1 VIEW COMPARISON:  06/14/2017 FINDINGS: Interval placement of endotracheal tube with the tip 4 cm above the carina. Remainder of the support devices are unchanged. Left chest tube in place without pneumothorax. Cardiomegaly. Diffuse bilateral airspace disease. No visible effusions. IMPRESSION: Interval intubation with endotracheal tube 4 cm above the carina. Diffuse bilateral airspace disease, presumably edema/CHF. Cannot exclude infection. Electronically Signed   By: Charlett NoseKevin  Dover M.D.   On: 06/14/2017  11:12   Dg Abd Portable 1v  Result Date: 06/14/2017 CLINICAL DATA:  Orogastric tube placement. EXAM: PORTABLE ABDOMEN - 1 VIEW COMPARISON:  None. FINDINGS: Enteric tube is looped on itself in the stomach. Multiple external devices overlie the abdomen. No bowel dilatation is seen to suggest obstruction, although evaluation is limited by patient body habitus and incomplete imaging of the left abdomen. IMPRESSION: Enteric tube in the stomach. Electronically Signed   By: Sebastian AcheAllen  Grady M.D.   On: 06/14/2017 11:14    STUDIES:  Echo 06/06/2017>>EF 40-45%, Moderate LVH, Mildly to moderately  reduced systolic dysfunction, MV mild regurgitation, LA mildly dilated 7/27 LHC/RHC >>90% left main stenosis, proximal 80-90% LAD stenosis, 90% stenosis of the mid RCA and 80% stenosis of the proximal circumflex. LVEDP is 12, cardiac output of 5.5 L/m with normal right-sided pressures.  CULTURES: 7/27 MRSA PCR >> negative Sputum 7/31>> multiple organism present, none predominant Respiratory 8/3 >>   ANTIBIOTICS: Ceftazidine 7/31>>  Zinacef 7/29 preop  SIGNIFICANT EVENTS: 06/03/2017>> CABG 06/10/2017>> Extubation 06/11/2017>> Emergent re-intubation for mixed respiratory/metabolic acidosis 06/12/2017 > self extubated -> BiPAP 06/13/17 > CRRT  LINES/TUBES: R IJ Swan 7/29 >> Left and Mediastinal CT>> 7/29 R Radial A Line 7/29 >>8/1 ETT 7/31 >> 8/1 Left femoral Aline 8/1 > L IJ HD cath 8/2 >>  DISCUSSION:  ASSESSMENT / PLAN:  PULMONARY A: Acute hypoxic respiratory failure Mixed Respiratory/Metabolic Acidosis  R/o HCAP/ LLL opacity Suspected OSA/OHS  P:   Continue mechanical ventilation. 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 Continue stable volume removal Follow BMP  GASTROINTESTINAL A:   No  Acute Issues  P:   BJY:[id number] Bowel regimen NPO, TF's soon if he is to remain intubated  HEMATOLOGIC A:   Anemia P:  Follow CBC Transfuse for HGB <8 DVT prophylaxis: SCD in place  INFECTIOUS A:   ? 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   Independent critical care time is 32 minutes.   Levy Pupa, MD, PhD 06/15/2017, 9:20 AM [facility] [phone] or if no answer [phone]

## 2017-06-15 NOTE — Progress Notes (Signed)
Subjective:  Intubated again yesterday, more comfortable.   CRRT running well, another 2 liters negative Objective Vital signs in last 24 hours: Vitals:   06/15/17 0500 06/15/17 0515 06/15/17 0530 06/15/17 0545  BP:      Pulse:  93 92 88  Resp: 14 (!) 8 12 11   Temp:  97.7 F (36.5 C) 97.7 F (36.5 C) 97.7 F (36.5 C)  TempSrc:      SpO2:  92% 93% 93%  Weight:  124.2 kg (273 lb 13 oz)    Height:       Weight change: -3.7 kg (-8 lb 2.5 oz)  Intake/Output Summary (Last 24 hours) at 06/15/17 0625 Last data filed at 06/15/17 0500  Gross per 24 hour  Intake          2331.56 ml  Output             4578 ml  Net         -2246.44 ml    Assessment/Plan: 76 year old male with diabetes status post recent MI and postop  from CABG (7/29) with acute on chronic renal failure 1.Renal- acute on chronic renal failure. Urinalysis previously bland given the scenario is likely ATN from hemodynamic cause. Patient started CRRT on 8/2.  Still volume overloaded, will try and remove more fluid as patient's blood pressure allows.  2. Hypertension/volume  - Patient is tolerating CRRT well, will continue as patient is still volume overloaded - pressor doses coming down ?  3. Elytes- potassium okay, sodium down likely secondary to volume- phos is decreasing - replete today  4. Anemia  - situational secondary to surgery- supportive care and transfuse as needed, Hgb in the 7's-  added ESA- transfuse as needed  5. Pulm- Required re intubation    Rania Prothero A         Labs: Basic Metabolic Panel:  Recent Labs Lab 06/14/17 0342 06/14/17 1605 06/15/17 0317  NA 132* 133* 134*  131*  K 4.2 4.6 3.9  3.9  CL 101 100* 102  100*  CO2 23 22 24  23   GLUCOSE 90 251* 157*  157*  BUN 38* 32* 27*  27*  CREATININE 3.18* 2.80* 2.46*  2.45*  CALCIUM 7.5* 7.5* 7.4*  7.5*  PHOS 3.5 3.8 2.3*   Liver Function Tests:  Recent Labs Lab 06/13/17 0344  06/14/17 0342 06/14/17 1605  06/15/17 0317  AST 39  --  36  --  31  ALT 25  --  22  --  17  ALKPHOS 79  --  100  --  114  BILITOT 0.8  --  1.0  --  1.1  PROT 5.1*  --  5.2*  --  5.3*  ALBUMIN 2.5*  < > 2.7* 2.8* 2.8*  2.9*  < > = values in this interval not displayed. No results for input(s): LIPASE, AMYLASE in the last 168 hours. No results for input(s): AMMONIA in the last 168 hours. CBC:  Recent Labs Lab 06/11/17 0500  06/12/17 1047 06/13/17 0344 06/14/17 0342 06/15/17 0317  WBC 30.3*  --  23.8* 21.2* 20.2* 16.3*  HGB 8.5*  < > 8.1* 9.0* 8.6* 7.6*  HCT 26.0*  < > 24.1* 26.3* 25.2* 22.5*  MCV 87.5  --  85.2 84.8 84.0 83.6  PLT 191  --  180 195 194 175  < > = values in this interval not displayed. Cardiac Enzymes: No results for input(s): CKTOTAL, CKMB, CKMBINDEX, TROPONINI in the last 168 hours. CBG:  Recent  Labs Lab 06/14/17 1134 06/14/17 1612 06/14/17 2001 06/14/17 2311 06/15/17 0318  GLUCAP 180* 237* 242* 216* 164*    Iron Studies: No results for input(s): IRON, TIBC, TRANSFERRIN, FERRITIN in the last 72 hours. Studies/Results: Portable Chest Xray  Result Date: 06/14/2017 CLINICAL DATA:  Intubated EXAM: PORTABLE CHEST 1 VIEW COMPARISON:  06/14/2017 FINDINGS: Interval placement of endotracheal tube with the tip 4 cm above the carina. Remainder of the support devices are unchanged. Left chest tube in place without pneumothorax. Cardiomegaly. Diffuse bilateral airspace disease. No visible effusions. IMPRESSION: Interval intubation with endotracheal tube 4 cm above the carina. Diffuse bilateral airspace disease, presumably edema/CHF. Cannot exclude infection. Electronically Signed   By: Rolm Baptise M.D.   On: 06/14/2017 11:12   Dg Chest Port 1 View  Result Date: 06/14/2017 CLINICAL DATA:  Shortness of breath.  Chest tube.  CABG. EXAM: PORTABLE CHEST 1 VIEW COMPARISON:  06/13/2017. FINDINGS: Swan-Ganz catheter, left IJ catheter, left chest tube in stable position. Prior CABG. Cardiomegaly. Left  base infiltrate noted.  No pleural effusion or pneumothorax . IMPRESSION: 1. Lines and tubes in stable position. 2.  Left base infiltrate noted on today's exam. 3. Prior CABG.  Stable cardiomegaly . Electronically Signed   By: Marcello Moores  Register   On: 06/14/2017 07:32   Dg Chest Port 1 View  Result Date: 06/13/2017 CLINICAL DATA:  Evaluate central line placed EXAM: PORTABLE CHEST 1 VIEW COMPARISON:  Portable chest x-ray of 06/13/2017 FINDINGS: A central venous line has been inserted from the left with the tip overlying the lateral mid SVC. No pneumothorax is seen. Swan-Ganz catheter and left chest tube remain and the lungs are not well aerated. Cardiomegaly is stable. Pulmonary vascular congestion cannot be excluded. IMPRESSION: 1. New left central venous line tip overlies the lateral mid SVC. No pneumothorax. 2. No change in poor aeration. Possible mild pulmonary vascular congestion. Electronically Signed   By: Ivar Drape M.D.   On: 06/13/2017 14:56   Dg Abd Portable 1v  Result Date: 06/14/2017 CLINICAL DATA:  Orogastric tube placement. EXAM: PORTABLE ABDOMEN - 1 VIEW COMPARISON:  None. FINDINGS: Enteric tube is looped on itself in the stomach. Multiple external devices overlie the abdomen. No bowel dilatation is seen to suggest obstruction, although evaluation is limited by patient body habitus and incomplete imaging of the left abdomen. IMPRESSION: Enteric tube in the stomach. Electronically Signed   By: Logan Bores M.D.   On: 06/14/2017 11:14   Medications: Infusions: . sodium chloride Stopped (06/12/17 1900)  . sodium chloride Stopped (06/10/17 0700)  . sodium chloride 20 mL/hr at 06/15/17 0500  . albumin human 12.5 g (06/15/17 0327)  . cefTAZidime (FORTAZ)  IV 2 g (06/14/17 2218)  . DOPamine 3.001 mcg/kg/min (06/15/17 0500)  . fentaNYL infusion INTRAVENOUS 150 mcg/hr (06/15/17 0500)  . lactated ringers    . lactated ringers 10 mL/hr at 06/13/17 1649  . milrinone 0.25 mcg/kg/min (06/15/17  0500)  . norepinephrine (LEVOPHED) Adult infusion 29.973 mcg/min (06/15/17 0500)  . dialysis replacement fluid (prismasate) 300 mL/hr at 06/15/17 0107  . dialysis replacement fluid (prismasate) 300 mL/hr at 06/15/17 0142  . dialysate (PRISMASATE) 1,500 mL/hr at 06/15/17 0533  . sodium chloride      Scheduled Medications: . aspirin EC  325 mg Oral Daily   Or  . aspirin  324 mg Per Tube Daily  . atorvastatin  80 mg Oral q1800  . bisacodyl  10 mg Oral Daily   Or  . bisacodyl  10  mg Rectal Daily  . chlorhexidine gluconate (MEDLINE KIT)  15 mL Mouth Rinse BID  . Chlorhexidine Gluconate Cloth  6 each Topical q morning - 10a  . [START ON 06/21/2017] darbepoetin (ARANESP) injection - DIALYSIS  100 mcg Intravenous Q Fri-HD  . docusate sodium  200 mg Oral Daily  . enoxaparin (LOVENOX) injection  30 mg Subcutaneous Daily  . insulin aspart  0-24 Units Subcutaneous Q4H  . levalbuterol  0.63 mg Nebulization Q6H  . mouth rinse  15 mL Mouth Rinse 10 times per day  . metoCLOPramide (REGLAN) injection  10 mg Intravenous Q6H  . metoprolol tartrate  12.5 mg Oral BID   Or  . metoprolol tartrate  12.5 mg Per Tube BID  . pantoprazole (PROTONIX) IV  40 mg Intravenous QHS  . sodium chloride flush  10-40 mL Intracatheter Q12H  . sodium chloride flush  3 mL Intravenous Q12H    have reviewed scheduled and prn medications.  Physical Exam: General: sedated on vent Heart: RRR Lungs: rhonchi throughout Abdomen: obese, non tender Extremities: pitting edema in upper and lower extremities bilaterally    06/15/2017,6:25 AM  LOS: 10 days

## 2017-06-16 ENCOUNTER — Inpatient Hospital Stay (HOSPITAL_COMMUNITY): Payer: Medicare Other

## 2017-06-16 LAB — POCT I-STAT 3, ART BLOOD GAS (G3+)
ACID-BASE DEFICIT: 1 mmol/L (ref 0.0–2.0)
BICARBONATE: 24.3 mmol/L (ref 20.0–28.0)
O2 Saturation: 97 %
PH ART: 7.399 (ref 7.350–7.450)
TCO2: 25 mmol/L (ref 0–100)
pCO2 arterial: 39.1 mmHg (ref 32.0–48.0)
pO2, Arterial: 93 mmHg (ref 83.0–108.0)

## 2017-06-16 LAB — COMPREHENSIVE METABOLIC PANEL
ALT: 17 U/L (ref 17–63)
AST: 37 U/L (ref 15–41)
Albumin: 2.9 g/dL — ABNORMAL LOW (ref 3.5–5.0)
Alkaline Phosphatase: 147 U/L — ABNORMAL HIGH (ref 38–126)
Anion gap: 9 (ref 5–15)
BUN: 18 mg/dL (ref 6–20)
CO2: 23 mmol/L (ref 22–32)
Calcium: 7.4 mg/dL — ABNORMAL LOW (ref 8.9–10.3)
Chloride: 101 mmol/L (ref 101–111)
Creatinine, Ser: 2.17 mg/dL — ABNORMAL HIGH (ref 0.61–1.24)
GFR calc Af Amer: 33 mL/min — ABNORMAL LOW (ref >60)
GFR calc non Af Amer: 28 mL/min — ABNORMAL LOW (ref >60)
Glucose, Bld: 151 mg/dL — ABNORMAL HIGH (ref 65–99)
Potassium: 4.2 mmol/L (ref 3.5–5.1)
Sodium: 133 mmol/L — ABNORMAL LOW (ref 135–145)
Total Bilirubin: 1.3 mg/dL — ABNORMAL HIGH (ref 0.3–1.2)
Total Protein: 5.2 g/dL — ABNORMAL LOW (ref 6.5–8.1)

## 2017-06-16 LAB — GLUCOSE, CAPILLARY
GLUCOSE-CAPILLARY: 140 mg/dL — AB (ref 65–99)
GLUCOSE-CAPILLARY: 147 mg/dL — AB (ref 65–99)
GLUCOSE-CAPILLARY: 164 mg/dL — AB (ref 65–99)
GLUCOSE-CAPILLARY: 185 mg/dL — AB (ref 65–99)
Glucose-Capillary: 147 mg/dL — ABNORMAL HIGH (ref 65–99)
Glucose-Capillary: 140 mg/dL — ABNORMAL HIGH (ref 65–99)
Glucose-Capillary: 189 mg/dL — ABNORMAL HIGH (ref 65–99)
Glucose-Capillary: 196 mg/dL — ABNORMAL HIGH (ref 65–99)

## 2017-06-16 LAB — PREPARE RBC (CROSSMATCH)

## 2017-06-16 LAB — PHOSPHORUS: Phosphorus: 2.8 mg/dL (ref 2.5–4.6)

## 2017-06-16 LAB — COOXEMETRY PANEL
Carboxyhemoglobin: 1.4 % (ref 0.5–1.5)
Methemoglobin: 1.1 % (ref 0.0–1.5)
O2 Saturation: 73.2 %
Total hemoglobin: 5.6 g/dL — CL (ref 12.0–16.0)

## 2017-06-16 LAB — CBC
HCT: 21.3 % — ABNORMAL LOW (ref 39.0–52.0)
Hemoglobin: 7.3 g/dL — ABNORMAL LOW (ref 13.0–17.0)
MCH: 29.1 pg (ref 26.0–34.0)
MCHC: 34.3 g/dL (ref 30.0–36.0)
MCV: 84.9 fL (ref 78.0–100.0)
Platelets: 168 10*3/uL (ref 150–400)
RBC: 2.51 MIL/uL — ABNORMAL LOW (ref 4.22–5.81)
RDW: 16.1 % — ABNORMAL HIGH (ref 11.5–15.5)
WBC: 17.9 10*3/uL — ABNORMAL HIGH (ref 4.0–10.5)

## 2017-06-16 LAB — RENAL FUNCTION PANEL
ALBUMIN: 3 g/dL — AB (ref 3.5–5.0)
ANION GAP: 11 (ref 5–15)
BUN: 18 mg/dL (ref 6–20)
CALCIUM: 7.5 mg/dL — AB (ref 8.9–10.3)
CO2: 23 mmol/L (ref 22–32)
Chloride: 101 mmol/L (ref 101–111)
Creatinine, Ser: 2 mg/dL — ABNORMAL HIGH (ref 0.61–1.24)
GFR calc Af Amer: 36 mL/min — ABNORMAL LOW (ref >60)
GFR, EST NON AFRICAN AMERICAN: 31 mL/min — AB (ref >60)
GLUCOSE: 183 mg/dL — AB (ref 65–99)
PHOSPHORUS: 2.7 mg/dL (ref 2.5–4.6)
POTASSIUM: 4.2 mmol/L (ref 3.5–5.1)
SODIUM: 135 mmol/L (ref 135–145)

## 2017-06-16 LAB — MAGNESIUM: MAGNESIUM: 2.5 mg/dL — AB (ref 1.7–2.4)

## 2017-06-16 MED ORDER — SODIUM CHLORIDE 0.9 % IV SOLN
Freq: Once | INTRAVENOUS | Status: AC
  Administered 2017-06-16: 30 mL via INTRAVENOUS

## 2017-06-16 MED ORDER — PANTOPRAZOLE SODIUM 40 MG PO PACK
40.0000 mg | PACK | Freq: Every day | ORAL | Status: DC
  Administered 2017-06-16 – 2017-06-24 (×9): 40 mg
  Filled 2017-06-16 (×9): qty 20

## 2017-06-16 MED ORDER — NEPRO/CARBSTEADY PO LIQD
1000.0000 mL | ORAL | Status: DC
  Administered 2017-06-16: 1000 mL
  Filled 2017-06-16 (×2): qty 1000

## 2017-06-16 MED ORDER — ACETAMINOPHEN 500 MG PO TABS: 1000.0000 mg | ORAL_TABLET | Freq: Four times a day (QID) | ORAL | Status: DC | PRN

## 2017-06-16 MED ORDER — DOCUSATE SODIUM 50 MG/5ML PO LIQD
200.0000 mg | Freq: Every day | ORAL | Status: DC
  Administered 2017-06-17 – 2017-06-18 (×2): 200 mg via ORAL
  Filled 2017-06-16 (×2): qty 20

## 2017-06-16 NOTE — Progress Notes (Signed)
Subjective:    CRRT running well, another 2 liters negative- down about 6  liters total Objective Vital signs in last 24 hours: Vitals:   06/16/17 0630 06/16/17 0645 06/16/17 0700 06/16/17 0721  BP:    (!) 106/41  Pulse: 89 76 75 76  Resp: (!) 22 (!) 22 (!) 21 (!) 22  Temp: (!) 97.5 F (36.4 C) (!) 97.5 F (36.4 C) (!) 97.5 F (36.4 C)   TempSrc:      SpO2: 94% 95% 94% 93%  Weight:      Height:       Weight change: -2.7 kg (-5 lb 15.2 oz)  Intake/Output Summary (Last 24 hours) at 06/16/17 2122 Last data filed at 06/16/17 0800  Gross per 24 hour  Intake          2789.43 ml  Output             5077 ml  Net         -2287.57 ml    Assessment/Plan: 76 year old male with diabetes status post recent MI and postop  from CABG (7/29) with acute on chronic renal failure- baseline crt 1.3ish 1.Renal- acute on chronic renal failure. Urinalysis bland-  given the scenario is likely ATN from hemodynamic cause. Patient started CRRT on 8/2.  Still volume overloaded, will try and remove more fluid as patient's blood pressure allows.  2. Hypertension/volume  - Patient is tolerating CRRT well, will continue as patient is still volume overloaded - pressor doses coming down some - still with massive edema 3. Elytes- potassium okay, sodium down likely secondary to volume- phos is decreasing - repleted 8/4- all 4 potassium fluids 4. Anemia  - situational secondary to surgery- supportive care and transfuse as needed, Hgb in the 7's-  added ESA- transfuse as needed  5. Pulm- Required re intubation not surprisingly    Zaneta Lightcap A         Labs: Basic Metabolic Panel:  Recent Labs Lab 06/15/17 0317 06/15/17 1559 06/16/17 0330 06/16/17 0350  NA 134*  131* 134*  --  133*  K 3.9  3.9 3.8  --  4.2  CL 102  100* 100*  --  101  CO2 24  23 23   --  23  GLUCOSE 157*  157* 154*  --  151*  BUN 27*  27* 20  --  18  CREATININE 2.46*  2.45* 2.17*  --  2.17*  CALCIUM 7.4*  7.5* 7.5*   --  7.4*  PHOS 2.3* 2.8 2.8  --    Liver Function Tests:  Recent Labs Lab 06/14/17 0342  06/15/17 0317 06/15/17 1559 06/16/17 0350  AST 36  --  31  --  37  ALT 22  --  17  --  17  ALKPHOS 100  --  114  --  147*  BILITOT 1.0  --  1.1  --  1.3*  PROT 5.2*  --  5.3*  --  5.2*  ALBUMIN 2.7*  < > 2.8*  2.9* 2.8* 2.9*  < > = values in this interval not displayed. No results for input(s): LIPASE, AMYLASE in the last 168 hours. No results for input(s): AMMONIA in the last 168 hours. CBC:  Recent Labs Lab 06/12/17 1047 06/13/17 0344 06/14/17 0342 06/15/17 0317 06/16/17 0350  WBC 23.8* 21.2* 20.2* 16.3* 17.9*  HGB 8.1* 9.0* 8.6* 7.6* 7.3*  HCT 24.1* 26.3* 25.2* 22.5* 21.3*  MCV 85.2 84.8 84.0 83.6 84.9  PLT 180 195 194 175 168  Cardiac Enzymes: No results for input(s): CKTOTAL, CKMB, CKMBINDEX, TROPONINI in the last 168 hours. CBG:  Recent Labs Lab 06/15/17 0755 06/15/17 1154 06/15/17 1925 06/15/17 2314 06/16/17 0349  GLUCAP 108* 153* 140* 147* 140*    Iron Studies: No results for input(s): IRON, TIBC, TRANSFERRIN, FERRITIN in the last 72 hours. Studies/Results: Dg Chest Port 1 View  Result Date: 06/16/2017 CLINICAL DATA:  Pulmonary edema, endotracheal tube, chest tube EXAM: PORTABLE CHEST 1 VIEW COMPARISON:  06/15/2017 FINDINGS: Endotracheal tube 3.5 cm from carina. Feeding tube, Swan-Ganz catheter, mediastinal drain, LEFT central venous line, and LEFT chest tube are unchanged. There is some improvement in pulmonary edema pattern. No pneumothorax IMPRESSION: 1. Stable support apparatus. 2. Mild Improvement in pulmonary edema pattern. Electronically Signed   By: Suzy Bouchard M.D.   On: 06/16/2017 07:30   Portable Chest Xray  Result Date: 06/15/2017 CLINICAL DATA:  CABG EXAM: PORTABLE CHEST 1 VIEW COMPARISON:  06/14/2017 FINDINGS: Endotracheal tube, Swan-Ganz catheter, mediastinal drain, and NG tube unchanged. Central venous line unchanged. Stable cardiac  silhouette. There is diffuse airspace disease in the LEFT lung not changed. LEFT chest tube in place without pneumothorax. RIGHT lung is relatively clear. IMPRESSION: None 1. No significant change. 2. Airspace disease in LEFT lung. 3. Stable support apparatus. Electronically Signed   By: Suzy Bouchard M.D.   On: 06/15/2017 08:23   Portable Chest Xray  Result Date: 06/14/2017 CLINICAL DATA:  Intubated EXAM: PORTABLE CHEST 1 VIEW COMPARISON:  06/14/2017 FINDINGS: Interval placement of endotracheal tube with the tip 4 cm above the carina. Remainder of the support devices are unchanged. Left chest tube in place without pneumothorax. Cardiomegaly. Diffuse bilateral airspace disease. No visible effusions. IMPRESSION: Interval intubation with endotracheal tube 4 cm above the carina. Diffuse bilateral airspace disease, presumably edema/CHF. Cannot exclude infection. Electronically Signed   By: Rolm Baptise M.D.   On: 06/14/2017 11:12   Dg Abd Portable 1v  Result Date: 06/14/2017 CLINICAL DATA:  Orogastric tube placement. EXAM: PORTABLE ABDOMEN - 1 VIEW COMPARISON:  None. FINDINGS: Enteric tube is looped on itself in the stomach. Multiple external devices overlie the abdomen. No bowel dilatation is seen to suggest obstruction, although evaluation is limited by patient body habitus and incomplete imaging of the left abdomen. IMPRESSION: Enteric tube in the stomach. Electronically Signed   By: Logan Bores M.D.   On: 06/14/2017 11:14   Medications: Infusions: . sodium chloride Stopped (06/12/17 1900)  . sodium chloride Stopped (06/10/17 0700)  . sodium chloride 10 mL/hr at 06/16/17 0700  . albumin human 12.5 g (06/15/17 0327)  . amiodarone 30 mg/hr (06/16/17 0700)  . cefTAZidime (FORTAZ)  IV 2 g (06/15/17 2107)  . DOPamine Stopped (06/16/17 0700)  . fentaNYL infusion INTRAVENOUS 75 mcg/hr (06/16/17 0700)  . lactated ringers    . lactated ringers 10 mL/hr at 06/16/17 0700  . milrinone 0.25 mcg/kg/min  (06/16/17 0700)  . norepinephrine (LEVOPHED) Adult infusion 28.053 mcg/min (06/16/17 0700)  . dialysis replacement fluid (prismasate) 300 mL/hr at 06/15/17 1848  . dialysis replacement fluid (prismasate) 300 mL/hr at 06/15/17 1920  . dialysate (PRISMASATE) 1,500 mL/hr at 06/16/17 0627  . sodium chloride      Scheduled Medications: . aspirin EC  325 mg Oral Daily   Or  . aspirin  324 mg Per Tube Daily  . atorvastatin  80 mg Oral q1800  . bisacodyl  10 mg Oral Daily   Or  . bisacodyl  10 mg Rectal Daily  . chlorhexidine  gluconate (MEDLINE KIT)  15 mL Mouth Rinse BID  . Chlorhexidine Gluconate Cloth  6 each Topical q morning - 10a  . [START ON 06/21/2017] darbepoetin (ARANESP) injection - DIALYSIS  100 mcg Intravenous Q Fri-HD  . docusate sodium  200 mg Oral Daily  . enoxaparin (LOVENOX) injection  30 mg Subcutaneous Daily  . insulin aspart  0-24 Units Subcutaneous Q4H  . levalbuterol  0.63 mg Nebulization Q6H  . mouth rinse  15 mL Mouth Rinse 10 times per day  . metoCLOPramide (REGLAN) injection  10 mg Intravenous Q6H  . metoprolol tartrate  12.5 mg Oral BID   Or  . metoprolol tartrate  12.5 mg Per Tube BID  . pantoprazole (PROTONIX) IV  40 mg Intravenous QHS  . sodium chloride flush  10-40 mL Intracatheter Q12H  . sodium chloride flush  3 mL Intravenous Q12H    have reviewed scheduled and prn medications.  Physical Exam: General: sedated on vent Heart: RRR Lungs: rhonchi throughout Abdomen: obese, non tender Extremities: still with impressive pitting edema in upper and lower extremities bilaterally Access- left IJ vascath placed 8/2    06/16/2017,8:28 AM  LOS: 11 days

## 2017-06-16 NOTE — Progress Notes (Signed)
      [address]       [place] [id number]             [phone]      Stable day  BP (!) 113/36   Pulse 77   Temp 97.9 F (36.6 C) (Core (Comment))   Resp (!) 24   Ht 5\' 8"  (1.727 m)   Wt 267 lb 13.7 oz (121.5 kg)   SpO2 100%   BMI 40.73 kg/m    Intake/Output Summary (Last 24 hours) at 06/16/17 1707 Last data filed at 06/16/17 1700  Gross per 24 hour  Intake          2410.61 ml  Output             5680 ml  Net         -3269.39 ml   Tolerating CVVH well. BP holding steady.  Continue present care  Viviann SpareSteven C. Dorris FetchHendrickson, MD [facility] [phone]

## 2017-06-16 NOTE — Progress Notes (Signed)
7 Days Post-Op Procedure(s) (LRB): CORONARY ARTERY BYPASS GRAFTING (CABG) x3 WITH TEE USING LEFT INTERNAL MAMMARY ARTERY AND RIGHT  SAPHENOUS LEG VEIN HARVESTED ENDOSCOPICALLY (N/A) Subjective: Intubated, sedated, intermittently follows commands on wake up assessments  Objective: Vital signs in last 24 hours: Temp:  [97.2 F (36.2 C)-98.1 F (36.7 C)] 97.3 F (36.3 C) (08/05 0800) Pulse Rate:  [72-115] 76 (08/05 0800) Cardiac Rhythm: Atrial fibrillation (08/05 0400) Resp:  [12-25] 23 (08/05 0800) BP: (97-115)/(28-41) 106/41 (08/05 0721) SpO2:  [91 %-100 %] 96 % (08/05 0800) Arterial Line BP: (79-122)/(33-46) 109/41 (08/05 0800) FiO2 (%):  [50 %-60 %] 50 % (08/05 0800) Weight:  [267 lb 13.7 oz (121.5 kg)] 267 lb 13.7 oz (121.5 kg) (08/05 0500)  Hemodynamic parameters for last 24 hours: PAP: (33-46)/(28-41) 46/41 CO:  [5 L/min-9 L/min] 6.3 L/min CI:  [2.2 L/min/m2-4 L/min/m2] 2.8 L/min/m2  Intake/Output from previous day: 08/04 0701 - 08/05 0700 In: 2897.7 [I.V.:2507.7; NG/GT:240; IV Piggyback:150] Out: 5120 [Urine:50; Emesis/NG output:150; Chest Tube:120] Intake/Output this shift: Total I/O In: 178.6 [I.V.:178.6] Out: 184 [Other:184]  General appearance: sedated Heart: irregularly irregular rhythm Lungs: wheezes faint bilateral Abdomen: soft Extremities: edema 3+  Lab Results:  Recent Labs  06/15/17 0317 06/16/17 0350  WBC 16.3* 17.9*  HGB 7.6* 7.3*  HCT 22.5* 21.3*  PLT 175 168   BMET:  Recent Labs  06/15/17 1559 06/16/17 0350  NA 134* 133*  K 3.8 4.2  CL 100* 101  CO2 23 23  GLUCOSE 154* 151*  BUN 20 18  CREATININE 2.17* 2.17*  CALCIUM 7.5* 7.4*    PT/INR: No results for input(s): LABPROT, INR in the last 72 hours. ABG    Component Value Date/Time   PHART 7.399 06/16/2017 0348   HCO3 24.3 06/16/2017 0348   TCO2 25 06/16/2017 0348   ACIDBASEDEF 1.0 06/16/2017 0348   O2SAT 73.2 06/16/2017 0425   CBG (last 3)   Recent Labs  06/15/17 1925  06/15/17 2314 06/16/17 0349  GLUCAP 140* 147* 140*    Assessment/Plan: S/P Procedure(s) (LRB): CORONARY ARTERY BYPASS GRAFTING (CABG) x3 WITH TEE USING LEFT INTERNAL MAMMARY ARTERY AND RIGHT  SAPHENOUS LEG VEIN HARVESTED ENDOSCOPICALLY (N/A) -  CV- still in atrial fib- rate controlled on amiodarone  Cardiac index is > 3 on milrinone and norepi  RESP- VDRF- vent per CCM   improved pulmonary edema on CXR  RENAL- 2 liters negative with CVVH- continue as still volume overloaded  ENDO- CBG well controlled  Nutrition- will start trickle tube feeds with Nepro via OG  ID- on Fotaz- WBC elevated  Anemia- Hgb 7.3- will transfuse 1 unit     LOS: 11 days    Philip Love 06/16/2017

## 2017-06-16 NOTE — Progress Notes (Deleted)
      [address]       [place] [id number]             [phone]      C/o headache  BP (!) 113/36   Pulse 77   Temp 97.9 F (36.6 C) (Core (Comment))   Resp (!) 24   Ht 5\' 8"  (1.727 m)   Wt 267 lb 13.7 oz (121.5 kg)   SpO2 100%   BMI 40.73 kg/m    Intake/Output Summary (Last 24 hours) at 06/16/17 1725 Last data filed at 06/16/17 1700  Gross per 24 hour  Intake          2410.61 ml  Output             5680 ml  Net         -3269.39 ml   Lung has stayed up on water seal so far today  Viviann SpareSteven C. Dorris FetchHendrickson, MD [facility] [phone]

## 2017-06-16 NOTE — Progress Notes (Signed)
[facility]   Name: Philip DutchRobert Love MRN: [id number] DOB: 09/03/1941    ADMISSION DATE:  06/10/2017 CONSULTATION DATE:  06/11/2017  REFERRING MD:  Morton PetersVan Tright  CHIEF COMPLAINT:  Respiratory Decompensation/ vent management  Brief Patient Summary:   76 year old obese former smoker with OSA , and insulin-dependent diabetes [place] He presented to the emergency department there with 2 weeks of progressive shortness of breath ankle swelling and chest discomfort. He had nonspecific EKG changes but was in sinus rhythm. Cardiac enzymes were mildly positive and chest x-ray showed mild CHF. He was transferred to this hospital. His creatinine was 2. Echocardiogram showed EF of 45%. No significant MR. Creatinine improved to 1.7 and he underwent coronary angiography and right heart cath. Coronary showed a 90% left main stenosis, proximal 80-90% LAD stenosis, 90% stenosis of the mid RCA and 80% stenosis of the proximal circumflex. LVEDP is 12. Right heart cath showed cardiac output of 5.5 L/m with normal right-sided pressures. The patient was admitted to the ICU placed on IV heparin and nitroglycerin. His creatinine post cath remains at 1.7.  Blood sugars are fairly well controlled.Patient underwent CABG x 3 with TEE using Left internal mammary artery and right saphenous leg vein on  05/12/2017. He was recovering in the CVICU, was extubated 06/10/2017. He refused CPAP the night of 7/30-7/31> He was in atrial fibrillation and when bolused with Amio he had a rhythm change with long pauses and significant change in mental status requiring emergent re-intubation per Dr. Delton CoombesByrum 7/31. ABG showed a metabolic acidosis.( 7.311/ 41.5/390/22/ 21.1)  CCM consulted for vent management and evaluation of respiratory decompensation.   SUBJECTIVE:  Remains on CVVHD, negative fluid balance over the last 2.5 days Tolerated amiodarone without significant  ectopy  VITAL SIGNS: BP (!) 106/41   Pulse 76   Temp (!) 97.3 F (36.3 C) (Core (Comment)) Comment (Src): swan  Resp (!) 23   Ht 5\' 8"  (1.727 m)   Wt 121.5 kg (267 lb 13.7 oz)   SpO2 96%   BMI 40.73 kg/m   HEMODYNAMICS: PAP: (33-46)/(28-41) 46/41 CO:  [5 L/min-9 L/min] 6.3 L/min CI:  [2.2 L/min/m2-4 L/min/m2] 2.8 L/min/m2  VENTILATOR SETTINGS: Vent Mode: PRVC FiO2 (%):  [50 %-60 %] 50 % Set Rate:  [22 bmp] 22 bmp Vt Set:  [550 mL] 550 mL PEEP:  [5 cmH20] 5 cmH20 Plateau Pressure:  [29 cmH20-31 cmH20] 30 cmH20  INTAKE / OUTPUT: I/O last 3 completed shifts: In: 4280.3 [I.V.:3420.3; NG/GT:300; IV Piggyback:560] Out: 7617 [Urine:95; Emesis/NG output:300; WUJWJ:1914Other:7052; Chest Tube:170]  PHYSICAL EXAMINATION: General:  Obese man, sedated, intubated, anasarca HEENT: Endotracheal tube in place, no oral lesions noted, pupils slightly irregular but equal and reactive Neuro: Sedated, grimaces with stimulation, does intermittently follow some commands CV: Irregular, not currently paced PULM: Coarse bilaterally, bibasilar inspiratory crackles GI: Obese, soft, nontender, bowel sounds present Extremities: 3+ lower extremity edema to the thigh Skin: No rash, some bullous weeping lesions  LABS:  BMET  Recent Labs Lab 06/15/17 0317 06/15/17 1559 06/16/17 0350  NA 134*  131* 134* 133*  K 3.9  3.9 3.8 4.2  CL 102  100* 100* 101  CO2 24  23 23 23   BUN 27*  27* 20 18  CREATININE 2.46*  2.45* 2.17* 2.17*  GLUCOSE 157*  157* 154* 151*    Electrolytes  Recent Labs Lab 06/14/17 0342  06/15/17 0317 06/15/17 1559 06/16/17 0330 06/16/17 0350  CALCIUM 7.5*  < >  7.4*  7.5* 7.5*  --  7.4*  MG 2.5*  --  2.5*  --   --  2.5*  PHOS 3.5  < > 2.3* 2.8 2.8  --   < > = values in this interval not displayed.  CBC  Recent Labs Lab 06/14/17 0342 06/15/17 0317 06/16/17 0350  WBC 20.2* 16.3* 17.9*  HGB 8.6* 7.6* 7.3*  HCT 25.2* 22.5* 21.3*  PLT 194 175 168     Coag's  Recent Labs Lab 06/03/2017 1516  APTT 42*  INR 1.47    Sepsis Markers  Recent Labs Lab 06/12/17 0905 06/12/17 1832  LATICACIDVEN 2.5* 1.4    ABG  Recent Labs Lab 06/15/17 0320 06/15/17 1609 06/16/17 0348  PHART 7.433 7.418 7.399  PCO2ART 37.5 36.7 39.1  PO2ART 90.0 87.0 93.0    Liver Enzymes  Recent Labs Lab 06/14/17 0342  06/15/17 0317 06/15/17 1559 06/16/17 0350  AST 36  --  31  --  37  ALT 22  --  17  --  17  ALKPHOS 100  --  114  --  147*  BILITOT 1.0  --  1.1  --  1.3*  ALBUMIN 2.7*  < > 2.8*  2.9* 2.8* 2.9*  < > = values in this interval not displayed.  Cardiac Enzymes No results for input(s): TROPONINI, PROBNP in the last 168 hours.  Glucose  Recent Labs Lab 06/15/17 0318 06/15/17 0755 06/15/17 1154 06/15/17 1925 06/15/17 2314 06/16/17 0349  GLUCAP 164* 108* 153* 140* 147* 140*    Imaging Dg Chest Port 1 View  Result Date: 06/16/2017 CLINICAL DATA:  Pulmonary edema, endotracheal tube, chest tube EXAM: PORTABLE CHEST 1 VIEW COMPARISON:  06/15/2017 FINDINGS: Endotracheal tube 3.5 cm from carina. Feeding tube, Swan-Ganz catheter, mediastinal drain, LEFT central venous line, and LEFT chest tube are unchanged. There is some improvement in pulmonary edema pattern. No pneumothorax IMPRESSION: 1. Stable support apparatus. 2. Mild Improvement in pulmonary edema pattern. Electronically Signed   By: Genevive BiStewart  Edmunds M.D.   On: 06/16/2017 07:30    STUDIES:  Echo 06/06/2017>>EF 40-45%, Moderate LVH, Mildly to moderately  reduced systolic dysfunction, MV mild regurgitation, LA mildly dilated 7/27 LHC/RHC >>90% left main stenosis, proximal 80-90% LAD stenosis, 90% stenosis of the mid RCA and 80% stenosis of the proximal circumflex. LVEDP is 12, cardiac output of 5.5 L/m with normal right-sided pressures.  CULTURES: 7/27 MRSA PCR >> negative Sputum 7/31>> multiple organism present, none predominant Respiratory 8/3 >>    ANTIBIOTICS: Ceftazidine 7/31>>  Zinacef 7/29 preop  SIGNIFICANT EVENTS: 06/08/2017>> CABG 06/10/2017>> Extubation 06/11/2017>> Emergent re-intubation for mixed respiratory/metabolic acidosis 06/12/2017 > self extubated -> BiPAP 06/13/17 > CRRT  LINES/TUBES: R IJ Swan 7/29 >> Left and Mediastinal CT>> 7/29 R Radial A Line 7/29 >>8/1 ETT 7/31 >> 8/1 Left femoral Aline 8/1 > L IJ HD cath 8/2 >>  DISCUSSION:  ASSESSMENT / PLAN:  PULMONARY A: Acute hypoxic respiratory failure Mixed Respiratory/Metabolic Acidosis  R/o HCAP/ LLL opacity Suspected OSA/OHS  P:   Continue mechanical ventilation. 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   Independent critical care time is 31 minutes.   Levy Pupa, MD, PhD 06/16/2017, 8:59 AM [facility] [phone] or if no answer [phone]

## 2017-06-17 ENCOUNTER — Encounter (HOSPITAL_COMMUNITY): Admission: EM | Disposition: E | Payer: Self-pay | Source: Home / Self Care | Attending: Cardiothoracic Surgery

## 2017-06-17 ENCOUNTER — Inpatient Hospital Stay (HOSPITAL_COMMUNITY): Payer: Medicare Other

## 2017-06-17 LAB — TYPE AND SCREEN
ABO/RH(D): O POS
Antibody Screen: NEGATIVE
UNIT DIVISION: 0

## 2017-06-17 LAB — POCT I-STAT 3, ART BLOOD GAS (G3+)
Bicarbonate: 25.3 mmol/L (ref 20.0–28.0)
O2 SAT: 98 %
PH ART: 7.393 (ref 7.350–7.450)
TCO2: 27 mmol/L (ref 0–100)
pCO2 arterial: 41.2 mmHg (ref 32.0–48.0)
pO2, Arterial: 98 mmHg (ref 83.0–108.0)

## 2017-06-17 LAB — RENAL FUNCTION PANEL
ALBUMIN: 3.1 g/dL — AB (ref 3.5–5.0)
Albumin: 3.1 g/dL — ABNORMAL LOW (ref 3.5–5.0)
Anion gap: 9 (ref 5–15)
Anion gap: 11 (ref 5–15)
BUN: 16 mg/dL (ref 6–20)
BUN: 15 mg/dL (ref 6–20)
CHLORIDE: 100 mmol/L — AB (ref 101–111)
CHLORIDE: 101 mmol/L (ref 101–111)
CO2: 24 mmol/L (ref 22–32)
CO2: 24 mmol/L (ref 22–32)
CREATININE: 2.08 mg/dL — AB (ref 0.61–1.24)
CREATININE: 1.82 mg/dL — AB (ref 0.61–1.24)
Calcium: 7.7 mg/dL — ABNORMAL LOW (ref 8.9–10.3)
Calcium: 7.6 mg/dL — ABNORMAL LOW (ref 8.9–10.3)
GFR calc Af Amer: 35 mL/min — ABNORMAL LOW (ref >60)
GFR calc Af Amer: 41 mL/min — ABNORMAL LOW (ref >60)
GFR calc non Af Amer: 30 mL/min — ABNORMAL LOW (ref >60)
GFR, EST NON AFRICAN AMERICAN: 35 mL/min — AB (ref >60)
GLUCOSE: 189 mg/dL — AB (ref 65–99)
Glucose, Bld: 232 mg/dL — ABNORMAL HIGH (ref 65–99)
POTASSIUM: 4 mmol/L (ref 3.5–5.1)
Phosphorus: 2.1 mg/dL — ABNORMAL LOW (ref 2.5–4.6)
Phosphorus: 3.2 mg/dL (ref 2.5–4.6)
Potassium: 4 mmol/L (ref 3.5–5.1)
Sodium: 133 mmol/L — ABNORMAL LOW (ref 135–145)
Sodium: 136 mmol/L (ref 135–145)

## 2017-06-17 LAB — COOXEMETRY PANEL
Carboxyhemoglobin: 0.9 % (ref 0.5–1.5)
Methemoglobin: 1.5 % (ref 0.0–1.5)
O2 Saturation: 62.7 %
Total hemoglobin: 7.5 g/dL — ABNORMAL LOW (ref 12.0–16.0)

## 2017-06-17 LAB — CBC
HCT: 24.5 % — ABNORMAL LOW (ref 39.0–52.0)
Hemoglobin: 8.3 g/dL — ABNORMAL LOW (ref 13.0–17.0)
MCH: 28.7 pg (ref 26.0–34.0)
MCHC: 33.9 g/dL (ref 30.0–36.0)
MCV: 84.8 fL (ref 78.0–100.0)
Platelets: 200 10*3/uL (ref 150–400)
RBC: 2.89 MIL/uL — ABNORMAL LOW (ref 4.22–5.81)
RDW: 15.6 % — ABNORMAL HIGH (ref 11.5–15.5)
WBC: 17.8 10*3/uL — ABNORMAL HIGH (ref 4.0–10.5)

## 2017-06-17 LAB — BPAM RBC
BLOOD PRODUCT EXPIRATION DATE: 201809042359
ISSUE DATE / TIME: 201808051146
UNIT TYPE AND RH: 5100

## 2017-06-17 LAB — IRON AND TIBC
Iron: 13 ug/dL — ABNORMAL LOW (ref 45–182)
Saturation Ratios: 8 % — ABNORMAL LOW (ref 17.9–39.5)
TIBC: 154 ug/dL — ABNORMAL LOW (ref 250–450)
UIBC: 141 ug/dL

## 2017-06-17 LAB — GLUCOSE, CAPILLARY
GLUCOSE-CAPILLARY: 164 mg/dL — AB (ref 65–99)
GLUCOSE-CAPILLARY: 182 mg/dL — AB (ref 65–99)
GLUCOSE-CAPILLARY: 230 mg/dL — AB (ref 65–99)
GLUCOSE-CAPILLARY: 209 mg/dL — AB (ref 65–99)
Glucose-Capillary: 183 mg/dL — ABNORMAL HIGH (ref 65–99)
Glucose-Capillary: 236 mg/dL — ABNORMAL HIGH (ref 65–99)

## 2017-06-17 LAB — CULTURE, RESPIRATORY W GRAM STAIN: Culture: NORMAL

## 2017-06-17 LAB — CULTURE, RESPIRATORY

## 2017-06-17 LAB — FERRITIN: Ferritin: 81 ng/mL (ref 24–336)

## 2017-06-17 LAB — MAGNESIUM: Magnesium: 2.5 mg/dL — ABNORMAL HIGH (ref 1.7–2.4)

## 2017-06-17 SURGERY — CORONARY ARTERY BYPASS GRAFTING (CABG)
Anesthesia: General | Site: Chest

## 2017-06-17 MED ORDER — NEPRO/CARBSTEADY PO LIQD
1000.0000 mL | ORAL | Status: DC
  Administered 2017-06-17 – 2017-06-18 (×2): 1000 mL
  Filled 2017-06-17 (×3): qty 1000

## 2017-06-17 MED ORDER — DARBEPOETIN ALFA 100 MCG/0.5ML IJ SOSY
100.0000 ug | PREFILLED_SYRINGE | INTRAMUSCULAR | Status: DC
  Administered 2017-06-17: 100 ug via INTRAVENOUS
  Filled 2017-06-17 (×2): qty 0.5

## 2017-06-17 MED ORDER — SODIUM PHOSPHATES 45 MMOLE/15ML IV SOLN
30.0000 mmol | Freq: Once | INTRAVENOUS | Status: AC
  Administered 2017-06-17: 30 mmol via INTRAVENOUS
  Filled 2017-06-17: qty 10

## 2017-06-17 NOTE — Progress Notes (Signed)
[facility] / CRITICAL CARE MEDICINE   Name: Philip Love MRN: [id number] DOB: [birth year]    ADMISSION DATE:  05/19/2017 CONSULTATION DATE:  06/11/2017  REFERRING MD:  Morton Peters  CHIEF COMPLAINT:  Respiratory Decompensation/ vent management  Brief Patient Summary:   76 year old obese former smoker with OSA , and insulin-dependent diabetes recently moved from [place] to [facility]. He presented to the emergency department there with 2 weeks of progressive shortness of breath ankle swelling and chest discomfort. He had nonspecific EKG changes but was in sinus rhythm. Cardiac enzymes were mildly positive and chest x-ray showed mild CHF. He was transferred to this hospital. His creatinine was 2. Echocardiogram showed EF of 45%. No significant MR. Creatinine improved to 1.7 and he underwent coronary angiography and right heart cath. Coronary showed a 90% left main stenosis, proximal 80-90% LAD stenosis, 90% stenosis of the mid RCA and 80% stenosis of the proximal circumflex. LVEDP is 12. Right heart cath showed cardiac output of 5.5 L/m with normal right-sided pressures. The patient was admitted to the ICU placed on IV heparin and nitroglycerin. His creatinine post cath remains at 1.7.  Blood sugars are fairly well controlled.Patient underwent CABG x 3 with TEE using Left internal mammary artery and right saphenous leg vein on  06/06/2017. He was recovering in the CVICU, was extubated 06/10/2017. He refused CPAP the night of 7/30-7/31> He was in atrial fibrillation and when bolused with Amio he had a rhythm change with long pauses and significant change in mental status requiring emergent re-intubation per Dr. Delton Coombes 7/31. ABG showed a metabolic acidosis.( 7.311/ 41.5/390/22/ 21.1)  CCM consulted for vent management and evaluation of respiratory decompensation.   SUBJECTIVE:  Tolerating CRRT well, -3.9L over past 24 hours (net +7.5L). Remains on levophed and milrinone.  VITAL SIGNS: BP (!)  101/35   Pulse 73   Temp 97.7 F (36.5 C)   Resp (!) 23   Ht 5\' 8"  (1.727 m)   Wt 118.8 kg (261 lb 14.5 oz)   SpO2 100%   BMI 39.82 kg/m   HEMODYNAMICS: PAP: (34-52)/(13-31) 40/16 CO:  [5.9 L/min-7.7 L/min] 5.9 L/min CI:  [2.6 L/min/m2-3.4 L/min/m2] 2.6 L/min/m2  VENTILATOR SETTINGS: Vent Mode: PRVC FiO2 (%):  [50 %] 50 % Set Rate:  [22 bmp] 22 bmp Vt Set:  [550 mL] 550 mL PEEP:  [5 cmH20] 5 cmH20 Plateau Pressure:  [26 cmH20-30 cmH20] 30 cmH20  INTAKE / OUTPUT: I/O last 3 completed shifts: In: 3570 [I.V.:2637; Blood:315; NG/GT:618] Out: 8563 [Urine:61; Emesis/NG output:50; WUJWJ:1914; Chest Tube:130]  PHYSICAL EXAMINATION: General:  Obese man, in NAD HEENT: Endotracheal tube in place, no oral lesions noted, PERRL Neuro: Sedated, grimaces with stimulation, does not follow commands CV: Irregular, not currently paced PULM: Coarse bilaterally, bibasilar inspiratory crackles GI: Obese, soft, nontender, bowel sounds present Extremities: 3+ lower extremity edema to the thigh, general anasarca Skin: No rash, some bullous weeping lesions  LABS:  BMET  Recent Labs Lab 06/16/17 0350 06/16/17 1552 2017-07-05 0317  NA 133* 135 133*  K 4.2 4.2 4.0  CL 101 101 100*  CO2 23 23 24   BUN 18 18 16   CREATININE 2.17* 2.00* 2.08*  GLUCOSE 151* 183* 189*    Electrolytes  Recent Labs Lab 06/15/17 0317  06/16/17 0330 06/16/17 0350 06/16/17 1552 July 05, 2017 0317  CALCIUM 7.4*  7.5*  < >  --  7.4* 7.5* 7.7*  MG 2.5*  --   --  2.5*  --  2.5*  PHOS 2.3*  < >  2.8  --  2.7 2.1*  < > = values in this interval not displayed.  CBC  Recent Labs Lab 06/15/17 0317 06/16/17 0350 06/21/2017 0317  WBC 16.3* 17.9* 17.8*  HGB 7.6* 7.3* 8.3*  HCT 22.5* 21.3* 24.5*  PLT 175 168 200    Coag's No results for input(s): APTT, INR in the last 168 hours.  Sepsis Markers  Recent Labs Lab 06/12/17 0905 06/12/17 1832  LATICACIDVEN 2.5* 1.4    ABG  Recent Labs Lab  06/15/17 1609 06/16/17 0348 07/06/2017 0333  PHART 7.418 7.399 7.393  PCO2ART 36.7 39.1 41.2  PO2ART 87.0 93.0 98.0    Liver Enzymes  Recent Labs Lab 06/14/17 0342  06/15/17 0317  06/16/17 0350 06/16/17 1552 06/24/2017 0317  AST 36  --  31  --  37  --   --   ALT 22  --  17  --  17  --   --   ALKPHOS 100  --  114  --  147*  --   --   BILITOT 1.0  --  1.1  --  1.3*  --   --   ALBUMIN 2.7*  < > 2.8*  2.9*  < > 2.9* 3.0* 3.1*  < > = values in this interval not displayed.  Cardiac Enzymes No results for input(s): TROPONINI, PROBNP in the last 168 hours.  Glucose  Recent Labs Lab 06/16/17 0833 06/16/17 1152 06/16/17 1603 06/16/17 1935 06/16/17 2308 06/24/2017 0331  GLUCAP 147* 164* 185* 189* 196* 183*    Imaging Dg Chest Port 1 View  Result Date: 07/01/2017 CLINICAL DATA:  Endotracheal tube. Chest tube. History of CABG and pulmonary edema. EXAM: PORTABLE CHEST 1 VIEW COMPARISON:  06/16/2017; 06/15/2017; 06/14/2017 FINDINGS: Grossly unchanged enlarged cardiac silhouette and mediastinal contours post median sternotomy. Atherosclerotic plaque in the thoracic aorta. Stable position of support apparatus. No pneumothorax. Pulmonary vasculature remains indistinct with cephalization of flow. Grossly unchanged bilateral infrahilar and bibasilar heterogeneous airspace opacities. No new focal airspace opacities. No definite pleural effusion or pneumothorax. No acute osseus abnormalities. IMPRESSION: 1.  Stable positioning of support apparatus.  No pneumothorax. 2. Similar findings of pulmonary edema and bibasilar opacities, atelectasis versus infiltrate. Electronically Signed   By: Simonne ComeJohn  Watts M.D.   On: 06/30/2017 07:13    STUDIES:  Echo 06/06/2017>>EF 40-45%, Moderate LVH, Mildly to moderately  reduced systolic dysfunction, MV mild regurgitation, LA mildly dilated 7/27 LHC/RHC >>90% left main stenosis, proximal 80-90% LAD stenosis, 90% stenosis of the mid RCA and 80% stenosis of the  proximal circumflex. LVEDP is 12, cardiac output of 5.5 L/m with normal right-sided pressures.  CULTURES: 7/27 MRSA PCR >> negative Sputum 7/31>> multiple organism present, none predominant Respiratory 8/3 >>   ANTIBIOTICS: Ceftazidine 7/31>>  Zinacef 7/29 preop  SIGNIFICANT EVENTS: 06/06/2017>> CABG 06/10/2017>> Extubation 06/11/2017>> Emergent re-intubation for mixed respiratory/metabolic acidosis 06/12/2017 > self extubated -> BiPAP 06/13/17 > CRRT  LINES/TUBES: R IJ Swan 7/29 >> Left and Mediastinal CT>> 7/29 R Radial A Line 7/29 >>8/1 ETT 7/31 >> 8/1 Left femoral Aline 8/1 > L IJ HD cath 8/2 >>  DISCUSSION:  ASSESSMENT / PLAN:  PULMONARY A: Acute hypoxic respiratory failure Mixed Respiratory/Metabolic Acidosis  R/o HCAP/ LLL opacity Suspected OSA/OHS  P:   Continue mechanical ventilation - 8 mL/kg Doubt he will be in a position for spontaneous breathing until more volume removed with CRRT Wean FiO2 and PEEP as able for SpO2 > 92% Follow sputum cultures Antibiotics as below  CARDIOVASCULAR  A:  Cardiogenic shock Afib/ Bradycardia (on amiodarone) 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   CC time: 30 min.   Rutherford Guys, [place] - [facility] [place] [facility] Pager: [phone]  or [phone] 06/26/2017, 10:37 AM

## 2017-06-17 NOTE — Progress Notes (Signed)
[facility] Rounding Note  Subjective:      CRRT running well, pulled 1.8L yesterday- down about 6  liters total Weight is down from a max of 130.1 kg to 118.8 kg today (adm weight 104 kg)  Objective Vital signs in last 24 hours: Vitals:   07/01/2017 0745 06/27/2017 0757 07/12/2017 0800 06/14/2017 0815  BP:  (!) 137/43    Pulse: 73 75 72 75  Resp: (!) 22 (!) 23 (!) 22 20  Temp: 98.2 F (36.8 C)  98.2 F (36.8 C) 98.1 F (36.7 C)  TempSrc:      SpO2: 95%  97% 98%  Weight:      Height:       Weight change: -5 lb 15.2 oz (-2.7 kg)  Intake/Output Summary (Last 24 hours) at 06/16/2017 4315 Last data filed at 07/01/2017 0800  Gross per 24 hour  Intake          2540.67 ml  Output             6195 ml  Net         -3654.33 ml   Physical Exam: General: sedated on vent Generalized anasarca Heart: RRR Sternotomy scar healing/clean and dry.   Lungs: rhonchi throughout Abdomen: obese Extremities: anasarca, although improving Access- left IJ vascath placed 8/2  Labs:   Recent Labs Lab 06/16/17 1552 06/19/2017 0317 06/18/2017 1632  NA 135 133* 136  K 4.2 4.0 4.0  CL 101 100* 101  CO2 23 24 24   GLUCOSE 183* 189* 232*  BUN 18 16 15   CREATININE 2.00* 2.08* 1.82*  CALCIUM 7.5* 7.7* 7.6*  PHOS 2.7 2.1* 3.2    Recent Labs Lab 06/14/17 0342  06/15/17 0317  06/16/17 0350 06/16/17 1552 06/14/2017 0317  AST 36  --  31  --  37  --   --   ALT 22  --  17  --  17  --   --   ALKPHOS 100  --  114  --  147*  --   --   BILITOT 1.0  --  1.1  --  1.3*  --   --   PROT 5.2*  --  5.3*  --  5.2*  --   --   ALBUMIN 2.7*  < > 2.8*  2.9*  < > 2.9* 3.0* 3.1*  < > = values in this interval not displayed. No results for input(s): LIPASE, AMYLASE in the last 168 hours. No results for input(s): AMMONIA in the last 168 hours. CBC:  Recent Labs Lab 06/13/17 0344 06/14/17 0342 06/15/17 0317 06/16/17 0350 07/09/2017 0317  WBC 21.2* 20.2* 16.3* 17.9* 17.8*  HGB 9.0* 8.6* 7.6* 7.3* 8.3*  HCT 26.3* 25.2* 22.5*  21.3* 24.5*  MCV 84.8 84.0 83.6 84.9 84.8  PLT 195 194 175 168 200    Recent Labs Lab 06/16/17 1152 06/16/17 1603 06/16/17 1935 06/16/17 2308 06/24/2017 0331  GLUCAP 164* 185* 189* 196* 183*    Studies/Results: Dg Chest Port 1 View  Result Date: 07/10/2017 CLINICAL DATA:  Endotracheal tube. Chest tube. History of CABG and pulmonary edema. EXAM: PORTABLE CHEST 1 VIEW COMPARISON:  06/16/2017; 06/15/2017; 06/14/2017 FINDINGS: Grossly unchanged enlarged cardiac silhouette and mediastinal contours post median sternotomy. Atherosclerotic plaque in the thoracic aorta. Stable position of support apparatus. No pneumothorax. Pulmonary vasculature remains indistinct with cephalization of flow. Grossly unchanged bilateral infrahilar and bibasilar heterogeneous airspace opacities. No new focal airspace opacities. No definite pleural effusion or pneumothorax. No acute osseus abnormalities. IMPRESSION: 1.  Stable positioning of support apparatus.  No pneumothorax. 2. Similar findings of pulmonary edema and bibasilar opacities, atelectasis versus infiltrate. Electronically Signed   By: Sandi Mariscal M.D.   On: 06/29/2017 07:13   Dg Chest Port 1 View  Result Date: 06/16/2017 CLINICAL DATA:  Pulmonary edema, endotracheal tube, chest tube EXAM: PORTABLE CHEST 1 VIEW COMPARISON:  06/15/2017 FINDINGS: Endotracheal tube 3.5 cm from carina. Feeding tube, Swan-Ganz catheter, mediastinal drain, LEFT central venous line, and LEFT chest tube are unchanged. There is some improvement in pulmonary edema pattern. No pneumothorax IMPRESSION: 1. Stable support apparatus. 2. Mild Improvement in pulmonary edema pattern. Electronically Signed   By: Suzy Bouchard M.D.   On: 06/16/2017 07:30   Medications: Infusions: . sodium chloride Stopped (06/12/17 1900)  . sodium chloride Stopped (06/10/17 0700)  . sodium chloride 20 mL/hr at 07/10/2017 0800  . albumin human 12.5 g (06/15/17 0327)  . amiodarone 30 mg/hr (06/28/2017 0800)  .  cefTAZidime (FORTAZ)  IV 2 g (06/16/17 2110)  . DOPamine Stopped (06/16/17 0700)  . fentaNYL infusion INTRAVENOUS Stopped (06/14/2017 0800)  . lactated ringers    . lactated ringers Stopped (06/26/2017 0400)  . milrinone 0.25 mcg/kg/min (06/16/2017 0800)  . norepinephrine (LEVOPHED) Adult infusion 18 mcg/min (07/10/2017 0800)  . dialysis replacement fluid (prismasate) 300 mL/hr at 07/03/2017 0514  . dialysis replacement fluid (prismasate) 300 mL/hr at 06/29/2017 0514  . dialysate (PRISMASATE) 1,500 mL/hr at 06/16/2017 0514  . sodium chloride      Scheduled Medications: . aspirin EC  325 mg Oral Daily   Or  . aspirin  324 mg Per Tube Daily  . atorvastatin  80 mg Oral q1800  . bisacodyl  10 mg Oral Daily   Or  . bisacodyl  10 mg Rectal Daily  . chlorhexidine gluconate (MEDLINE KIT)  15 mL Mouth Rinse BID  . Chlorhexidine Gluconate Cloth  6 each Topical q morning - 10a  . [START ON 06/21/2017] darbepoetin (ARANESP) injection - DIALYSIS  100 mcg Intravenous Q Fri-HD  . docusate  200 mg Oral Daily  . enoxaparin (LOVENOX) injection  30 mg Subcutaneous Daily  . feeding supplement (NEPRO CARB STEADY)  1,000 mL Per Tube Q24H  . insulin aspart  0-24 Units Subcutaneous Q4H  . levalbuterol  0.63 mg Nebulization Q6H  . mouth rinse  15 mL Mouth Rinse 10 times per day  . metoCLOPramide (REGLAN) injection  10 mg Intravenous Q6H  . metoprolol tartrate  12.5 mg Oral BID   Or  . metoprolol tartrate  12.5 mg Per Tube BID  . pantoprazole sodium  40 mg Per Tube QHS  . sodium chloride flush  10-40 mL Intracatheter Q12H  . sodium chloride flush  3 mL Intravenous Q12H   Background 76 y.o. male with past medical history hypertension, diabetes mellitus, OSA, chronic low back pain. Presented 7/25 with SOB/HF symptoms,  troponin was elevated, creatinine 1.4. Cath done 7/27 (w/creatinine 1.96 and after that creatinine improved to 1.1-1.2) ->3V ds. CABG 7/29 with progressive rise in creatinine post op, development of  oliguria, failed extubation, marked volume overload. Renal was asked to see. Failed lasix challenge, CRRT initiated 8/2.   Assessment/Plan:   1. 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 BL creatinine around 1.4 Urinalysis bland. Giiven the scenario likely ATN from hemodynamic cause. Minimal UOP at this time.Patient started CRRT on 8/2.  Still volume overloaded, will try and remove more fluid as patient's blood pressure allows. Currently pulling about 150cc/hr. So far weight down from 130kg->118.8 kg  with adm weight noted to be 104.3 kg 2. Hypertension/volume  - Patient is tolerating CRRT well, will continue as patient is still volume overloaded - pressor doses coming down some - still with massive edema.  Weight down 20lbs since start of CRRT. 3. Elytes- potassium okay, sodium down likely secondary to volume- phos is decreasing - repleted 8/4 and today 4. Anemia  - situational secondary to surgery- supportive care and transfuse as needed, Hgb in the 8s after transfused 1 unit of PRBC yesterday-  Got iron stores today.  Iron -13, TIBC -154, saturation ratio 8.  Will start ESA today Aranesp 100 mcg. Will need IV Fe for tsat 8% once off ATB's   5. Pulm- Required re intubation on 8/3. Pulm edema + pos HCAP. Fortaz through 8/7. Resp cx 8/5 pending.  6. S/p 3V CABG 06/08/2017 - per Dr. Drue Novel  07/07/2017,8:32 AM  LOS: 12 days    I have seen and examined this patient and agree with plan and assessment in the above note with renal recommendations/intervention highlighted. Tolerating CRRT well, fluid coming off, weight down >11 kg, still on pressor/inotrope support. Tressie Ellis for possible HCAP. Started Aranesp 100/week. Once off ATB's will replete Fe IV.    Dorsie Burich B,MD 06/13/2017 6:46 PM

## 2017-06-17 NOTE — Progress Notes (Signed)
8 Days Post-Op Procedure(s) (LRB): CORONARY ARTERY BYPASS GRAFTING (CABG) x3 WITH TEE USING LEFT INTERNAL MAMMARY ARTERY AND RIGHT  SAPHENOUS LEG VEIN HARVESTED ENDOSCOPICALLY (N/A) Subjective: Acute on chronic renal failure now on CRT for fluid removal Urgent multivessel CABG for critical left main stenosis Stable cardiac index and hemodynamics postop Acute respiratory failure related to the patient's obesity, sleep apnea, and fluid retention from acute renal failure Patient slowly improving pulmonary status with 2 pounds per day removed by CVVH Objective: Vital signs in last 24 hours: Temp:  [91 F (32.8 C)-98.8 F (37.1 C)] 97.5 F (36.4 C) (08/06 1619) Pulse Rate:  [69-80] 72 (08/06 1700) Cardiac Rhythm: Normal sinus rhythm (08/06 1600) Resp:  [0-28] 25 (08/06 1700) BP: (89-137)/(28-43) 98/36 (08/06 1700) SpO2:  [94 %-100 %] 98 % (08/06 1700) Arterial Line BP: (101-143)/(31-49) 121/40 (08/06 0815) FiO2 (%):  [40 %-50 %] 40 % (08/06 1603) Weight:  [261 lb 14.5 oz (118.8 kg)] 261 lb 14.5 oz (118.8 kg) (08/06 0500)  Hemodynamic parameters for last 24 hours: PAP: (34-52)/(13-31) 40/16 CO:  [5.9 L/min-7.7 L/min] 5.9 L/min CI:  [2.6 L/min/m2-3.4 L/min/m2] 2.6 L/min/m2  Intake/Output from previous day: 08/05 0701 - 08/06 0700 In: 2643.2 [I.V.:1800.2; Blood:315; NG/GT:528] Out: 6160 [Urine:41; Chest Tube:50] Intake/Output this shift: Total I/O In: 1209.6 [I.V.:551.2; Other:50; NG/GT:348.3; IV Piggyback:260] Out: 2626 [Urine:15; Other:2611]  Morbid obese Sedated but responsive Surgical incisions clean and dry Chest tubes removed from mediastinum  Lab Results:  Recent Labs  06/16/17 0350 06/28/2017 0317  WBC 17.9* 17.8*  HGB 7.3* 8.3*  HCT 21.3* 24.5*  PLT 168 200   BMET:  Recent Labs  06/27/2017 0317 06/14/2017 1632  NA 133* 136  K 4.0 4.0  CL 100* 101  CO2 24 24  GLUCOSE 189* 232*  BUN 16 15  CREATININE 2.08* 1.82*  CALCIUM 7.7* 7.6*    PT/INR: No results for  input(s): LABPROT, INR in the last 72 hours. ABG    Component Value Date/Time   PHART 7.393 07/05/2017 0333   HCO3 25.3 07/08/2017 0333   TCO2 27 06/12/2017 0333   ACIDBASEDEF 1.0 06/16/2017 0348   O2SAT 62.7 06/19/2017 0359   CBG (last 3)   Recent Labs  06/19/2017 0725 06/12/2017 1059 07/11/2017 1617  GLUCAP 182* 230* 236*    Assessment/Plan: S/P Procedure(s) (LRB): CORONARY ARTERY BYPASS GRAFTING (CABG) x3 WITH TEE USING LEFT INTERNAL MAMMARY ARTERY AND RIGHT  SAPHENOUS LEG VEIN HARVESTED ENDOSCOPICALLY (N/A) Increase Nepro tube feedings 40 cc/h  continue with fluid removal by CRT Not yet ready for extubation   LOS: 12 days    Kathlee Nationseter Van Trigt III 07/11/2017

## 2017-06-18 ENCOUNTER — Inpatient Hospital Stay (HOSPITAL_COMMUNITY): Payer: Medicare Other

## 2017-06-18 DIAGNOSIS — J810 Acute pulmonary edema: Secondary | ICD-10-CM

## 2017-06-18 DIAGNOSIS — G9340 Encephalopathy, unspecified: Secondary | ICD-10-CM

## 2017-06-18 LAB — RENAL FUNCTION PANEL
ALBUMIN: 3.3 g/dL — AB (ref 3.5–5.0)
Anion gap: 8 (ref 5–15)
BUN: 17 mg/dL (ref 6–20)
CHLORIDE: 102 mmol/L (ref 101–111)
CO2: 27 mmol/L (ref 22–32)
CREATININE: 1.79 mg/dL — AB (ref 0.61–1.24)
Calcium: 7.8 mg/dL — ABNORMAL LOW (ref 8.9–10.3)
GFR, EST AFRICAN AMERICAN: 42 mL/min — AB (ref >60)
GFR, EST NON AFRICAN AMERICAN: 36 mL/min — AB (ref >60)
Glucose, Bld: 192 mg/dL — ABNORMAL HIGH (ref 65–99)
PHOSPHORUS: 2.2 mg/dL — AB (ref 2.5–4.6)
POTASSIUM: 4 mmol/L (ref 3.5–5.1)
Sodium: 137 mmol/L (ref 135–145)

## 2017-06-18 LAB — COMPREHENSIVE METABOLIC PANEL
ALT: 19 U/L (ref 17–63)
AST: 56 U/L — ABNORMAL HIGH (ref 15–41)
Albumin: 3.2 g/dL — ABNORMAL LOW (ref 3.5–5.0)
Alkaline Phosphatase: 325 U/L — ABNORMAL HIGH (ref 38–126)
Anion gap: 12 (ref 5–15)
BUN: 16 mg/dL (ref 6–20)
CO2: 24 mmol/L (ref 22–32)
Calcium: 7.7 mg/dL — ABNORMAL LOW (ref 8.9–10.3)
Chloride: 98 mmol/L — ABNORMAL LOW (ref 101–111)
Creatinine, Ser: 1.8 mg/dL — ABNORMAL HIGH (ref 0.61–1.24)
GFR calc Af Amer: 41 mL/min — ABNORMAL LOW (ref >60)
GFR calc non Af Amer: 36 mL/min — ABNORMAL LOW (ref >60)
Glucose, Bld: 287 mg/dL — ABNORMAL HIGH (ref 65–99)
Potassium: 4.1 mmol/L (ref 3.5–5.1)
Sodium: 134 mmol/L — ABNORMAL LOW (ref 135–145)
Total Bilirubin: 1.1 mg/dL (ref 0.3–1.2)
Total Protein: 6.1 g/dL — ABNORMAL LOW (ref 6.5–8.1)

## 2017-06-18 LAB — CULTURE, RESPIRATORY: CULTURE: NO GROWTH

## 2017-06-18 LAB — CBC
HCT: 25.1 % — ABNORMAL LOW (ref 39.0–52.0)
Hemoglobin: 8.2 g/dL — ABNORMAL LOW (ref 13.0–17.0)
MCH: 28.3 pg (ref 26.0–34.0)
MCHC: 32.7 g/dL (ref 30.0–36.0)
MCV: 86.6 fL (ref 78.0–100.0)
Platelets: 194 10*3/uL (ref 150–400)
RBC: 2.9 MIL/uL — ABNORMAL LOW (ref 4.22–5.81)
RDW: 16.2 % — ABNORMAL HIGH (ref 11.5–15.5)
WBC: 18.9 10*3/uL — ABNORMAL HIGH (ref 4.0–10.5)

## 2017-06-18 LAB — COOXEMETRY PANEL
Carboxyhemoglobin: 1.3 % (ref 0.5–1.5)
Methemoglobin: 0.9 % (ref 0.0–1.5)
O2 Saturation: 59.4 %
Total hemoglobin: 8.4 g/dL — ABNORMAL LOW (ref 12.0–16.0)

## 2017-06-18 LAB — GLUCOSE, CAPILLARY
GLUCOSE-CAPILLARY: 201 mg/dL — AB (ref 65–99)
GLUCOSE-CAPILLARY: 221 mg/dL — AB (ref 65–99)
Glucose-Capillary: 181 mg/dL — ABNORMAL HIGH (ref 65–99)
Glucose-Capillary: 186 mg/dL — ABNORMAL HIGH (ref 65–99)
Glucose-Capillary: 187 mg/dL — ABNORMAL HIGH (ref 65–99)
Glucose-Capillary: 196 mg/dL — ABNORMAL HIGH (ref 65–99)
Glucose-Capillary: 211 mg/dL — ABNORMAL HIGH (ref 65–99)

## 2017-06-18 LAB — CULTURE, RESPIRATORY W GRAM STAIN

## 2017-06-18 LAB — PHOSPHORUS: Phosphorus: 2.3 mg/dL — ABNORMAL LOW (ref 2.5–4.6)

## 2017-06-18 LAB — MAGNESIUM: MAGNESIUM: 2.7 mg/dL — AB (ref 1.7–2.4)

## 2017-06-18 MED ORDER — SODIUM PHOSPHATES 45 MMOLE/15ML IV SOLN
30.0000 mmol | Freq: Once | INTRAVENOUS | Status: AC
  Administered 2017-06-18: 30 mmol via INTRAVENOUS
  Filled 2017-06-18: qty 10

## 2017-06-18 MED ORDER — VANCOMYCIN HCL IN DEXTROSE 1-5 GM/200ML-% IV SOLN: 1000.0000 mg | Freq: Once | INTRAVENOUS | Status: DC

## 2017-06-18 MED ORDER — VANCOMYCIN HCL 10 G IV SOLR
2000.0000 mg | Freq: Once | INTRAVENOUS | Status: AC
  Administered 2017-06-18: 2000 mg via INTRAVENOUS
  Filled 2017-06-18: qty 2000

## 2017-06-18 MED ORDER — PIPERACILLIN-TAZOBACTAM 3.375 G IVPB: 3.3750 g | Freq: Three times a day (TID) | INTRAVENOUS | Status: DC

## 2017-06-18 MED ORDER — VITAL HIGH PROTEIN PO LIQD: 1000.0000 mL | ORAL | Status: DC

## 2017-06-18 MED ORDER — NEPRO/CARBSTEADY PO LIQD
1000.0000 mL | ORAL | Status: DC
  Administered 2017-06-19 – 2017-06-20 (×2): 1000 mL
  Filled 2017-06-18 (×4): qty 1000

## 2017-06-18 MED ORDER — SODIUM PHOSPHATES 45 MMOLE/15ML IV SOLN
20.0000 mmol | Freq: Every day | INTRAVENOUS | Status: DC
  Administered 2017-06-19 – 2017-06-20 (×2): 20 mmol via INTRAVENOUS
  Filled 2017-06-18 (×3): qty 6.67

## 2017-06-18 MED ORDER — INSULIN GLARGINE 100 UNIT/ML ~~LOC~~ SOLN
20.0000 [IU] | Freq: Two times a day (BID) | SUBCUTANEOUS | Status: DC
  Administered 2017-06-18 – 2017-06-20 (×5): 20 [IU] via SUBCUTANEOUS
  Filled 2017-06-18 (×6): qty 0.2

## 2017-06-18 MED ORDER — VANCOMYCIN HCL IN DEXTROSE 1-5 GM/200ML-% IV SOLN
1000.0000 mg | INTRAVENOUS | Status: DC
  Administered 2017-06-19: 1000 mg via INTRAVENOUS
  Filled 2017-06-18: qty 200

## 2017-06-18 MED ORDER — SODIUM PHOSPHATES 45 MMOLE/15ML IV SOLN
10.0000 mmol | Freq: Once | INTRAVENOUS | Status: DC
  Filled 2017-06-18: qty 3.33

## 2017-06-18 MED ORDER — PRO-STAT SUGAR FREE PO LIQD
60.0000 mL | Freq: Four times a day (QID) | ORAL | Status: DC
  Administered 2017-06-18 – 2017-06-24 (×26): 60 mL
  Filled 2017-06-18 (×25): qty 60

## 2017-06-18 MED ORDER — PIPERACILLIN-TAZOBACTAM 3.375 G IVPB
3.3750 g | Freq: Four times a day (QID) | INTRAVENOUS | Status: DC
  Administered 2017-06-18 – 2017-06-23 (×21): 3.375 g via INTRAVENOUS
  Filled 2017-06-18 (×26): qty 50

## 2017-06-18 NOTE — Procedures (Signed)
HPI:  76 y/o with MS change  TECHNICAL SUMMARY:  A multichannel referential and bipolar montage EEG using the standard international 10-20 system was performed on the patient described as nonresponsive.  The dominant background activity consists of 5-6 hertz activity seen most prominantly over the posterior head region.   Low voltage fast (beta) activity is distributed symmetrically and maximally over the anterior head regions.  ACTIVATION:  Stepwise photic stimulation and hyperventilation was not performed.  EPILEPTIFORM ACTIVITY:  There were no spikes, sharp waves or paroxysmal activity.  SLEEP:  No sleep architecture is identified.  CARDIAC:  The EKG lead is not well recorded.  IMPRESSION:  This is an abnormal EEG demonstrating a moderate diffuse slowing of electrocerebral activity.  This can be seen in a wide variety of encephalopathic state including those of a toxic, metabolic, or degenerative nature.  There were no focal, hemispheric, or lateralizing features.  No epileptiform activity was recorded.

## 2017-06-18 NOTE — Progress Notes (Signed)
9 Days Post-Op Procedure(s) (LRB): CORONARY ARTERY BYPASS GRAFTING (CABG) x3 WITH TEE USING LEFT INTERNAL MAMMARY ARTERY AND RIGHT  SAPHENOUS LEG VEIN HARVESTED ENDOSCOPICALLY (N/A) Subjective: Non purposeful response after several days iv sedation on CRT, intubated CXR w/ improved edema Nsr, co-ox satisfactory New central line placed today - old introducer from OR removed Objective: Vital signs in last 24 hours: Temp:  [97.5 F (36.4 C)-98.5 F (36.9 C)] 98.5 F (36.9 C) (08/07 0754) Pulse Rate:  [59-75] 75 (08/07 0900) Cardiac Rhythm: Normal sinus rhythm (08/07 0800) Resp:  [19-28] 22 (08/07 0900) BP: (86-152)/(28-48) 152/46 (08/07 0900) SpO2:  [9 %-100 %] 95 % (08/07 0900) FiO2 (%):  [40 %] 40 % (08/07 0807) Weight:  [256 lb 2.8 oz (116.2 kg)] 256 lb 2.8 oz (116.2 kg) (08/07 0300)  Hemodynamic parameters for last 24 hours:  stable  Intake/Output from previous day: 08/06 0701 - 08/07 0700 In: 2241.5 [I.V.:1143.2; NG/GT:788.3; IV Piggyback:260] Out: 5913 [Urine:15] Intake/Output this shift: Total I/O In: 443.1 [I.V.:243.1; NG/GT:200] Out: 241 [Urine:12; Other:229]  Lungs clear Incisions clean extrem warm  Lab Results:  Recent Labs  06/26/2017 0317 06/18/17 0405  WBC 17.8* 18.9*  HGB 8.3* 8.2*  HCT 24.5* 25.1*  PLT 200 194   BMET:  Recent Labs  07/12/2017 1632 06/18/17 0405  NA 136 134*  K 4.0 4.1  CL 101 98*  CO2 24 24  GLUCOSE 232* 287*  BUN 15 16  CREATININE 1.82* 1.80*  CALCIUM 7.6* 7.7*    PT/INR: No results for input(s): LABPROT, INR in the last 72 hours. ABG    Component Value Date/Time   PHART 7.393 07/06/2017 0333   HCO3 25.3 06/15/2017 0333   TCO2 27 06/12/2017 0333   ACIDBASEDEF 1.0 06/16/2017 0348   O2SAT 59.4 06/18/2017 0432   CBG (last 3)   Recent Labs  06/18/17 0001 06/18/17 0434 06/18/17 0740  GLUCAP 221* 201* 196*    Assessment/Plan: S/P Procedure(s) (LRB): CORONARY ARTERY BYPASS GRAFTING (CABG) x3 WITH TEE USING LEFT  INTERNAL MAMMARY ARTERY AND RIGHT  SAPHENOUS LEG VEIN HARVESTED ENDOSCOPICALLY (N/A) Cont CVVH- patient making some urine now Wean vent after sedation wears off - will take at least another day Rising WBC- reculture and cover with vancomycin Cont TF- nepro  LOS: 13 days    Kathlee Nationseter Van Trigt III 06/18/2017

## 2017-06-18 NOTE — Progress Notes (Signed)
EEG completed, results pending. 

## 2017-06-18 NOTE — Progress Notes (Signed)
Pharmacy Antibiotic Note  Philip DutchRobert Love is a 76 y.o. male admitted on 06/08/2017 with respiratory failure requiring mechanical ventillation. Pt was treated with 7 days of Fortaz, but WBC has increased slightly. Pharmacy has been consulted for vancomycin and Zosyn dosing. Note: pt is on CRRT since 8/2 for volume control.  Plan: -Vancomycin 2g IV x1 then 1g IV q24h -Zosyn 3.375g IV q6h -Check vancomycin level as indicated -Monitor CRRT daily, S/Sx infection, LOT, cultures  Height: 5\' 8"  (172.7 cm) Weight: 256 lb 2.8 oz (116.2 kg) IBW/kg (Calculated) : 68.4  Temp (24hrs), Avg:98.2 F (36.8 C), Min:97.5 F (36.4 C), Max:98.5 F (36.9 C)   Recent Labs Lab 06/12/17 0905  06/12/17 1832  06/14/17 0342  06/15/17 0317  06/16/17 0350 06/16/17 1552 07/01/2017 0317 07/11/2017 1632 06/18/17 0405  WBC  --   < >  --   < > 20.2*  --  16.3*  --  17.9*  --  17.8*  --  18.9*  CREATININE 3.86*  < >  --   < > 3.18*  < > 2.46*  2.45*  < > 2.17* 2.00* 2.08* 1.82* 1.80*  LATICACIDVEN 2.5*  --  1.4  --   --   --   --   --   --   --   --   --   --   < > = values in this interval not displayed.  Estimated Creatinine Clearance: 45.2 mL/min (A) (by C-G formula based on SCr of 1.8 mg/dL (H)).    Allergies  Allergen Reactions  . Bee Venom Swelling    Antimicrobials this admission: 7/31 Ceftazidime >> 8/6 8/7 Vancomycin >>  8/7 Zosyn >>  Dose adjustments this admission: n/a  Microbiology results: 7/27 surgical screen - MSSA 7/31 Trach aspirate - rare GNR 8/3 Trach aspirate - normal flora 8/5 Trach aspirate - NGTD x2  Thank you for allowing pharmacy to be a part of this patient's care.  Mosetta AnisMichael T Bitonti 06/18/2017 9:36 AM

## 2017-06-18 NOTE — Progress Notes (Signed)
[facility] Rounding Note  Subjective:     Patient is intubated on the vent.  Sedation has been off since yesterday however patient only responsive to pain.  Plan is for EEG and CT head.  CRRT continues to run well.  Pulled 1.9L overnight.  Weight is down from a max of 130.1 kg to 116.2 kg today (adm weight 104 kg)  Objective Vital signs in last 24 hours: Vitals:   06/18/17 0800 06/18/17 0804 06/18/17 0830 06/18/17 0900  BP: (!) 128/47 (!) 128/47 (!) 126/42 (!) 152/46  Pulse: 73 74 74 75  Resp: (!) 21 (!) 23 (!) 21 (!) 22  Temp:      TempSrc:      SpO2: 98%  99% 95%  Weight:      Height:       Weight change: -5 lb 11.7 oz (-2.6 kg)  Intake/Output Summary (Last 24 hours) at 06/18/17 6767 Last data filed at 06/18/17 0900  Gross per 24 hour  Intake          2536.21 ml  Output             6001 ml  Net         -3464.79 ml   Physical Exam: General: sedated on vent, Generalized anasarca Heart: RRR Sternotomy scar healing/clean and dry.   Lungs: rhonchi throughout Abdomen: obese Extremities: 2+ pitting edema in upper and lower extremities bilaterally  Access- left IJ vascath placed 8/2 New R [place] line (8/7)  Labs:   Recent Labs Lab 06/24/2017 0317 07/03/2017 1632 06/18/17 0405  NA 133* 136 134*  K 4.0 4.0 4.1  CL 100* 101 98*  CO2 24 24 24   GLUCOSE 189* 232* 287*  BUN 16 15 16   CREATININE 2.08* 1.82* 1.80*  CALCIUM 7.7* 7.6* 7.7*  PHOS 2.1* 3.2 2.3*    Recent Labs Lab 06/15/17 0317  06/16/17 0350  07/07/2017 0317 07/06/2017 1632 06/18/17 0405  AST 31  --  37  --   --   --  56*  ALT 17  --  17  --   --   --  19  ALKPHOS 114  --  147*  --   --   --  325*  BILITOT 1.1  --  1.3*  --   --   --  1.1  PROT 5.3*  --  5.2*  --   --   --  6.1*  ALBUMIN 2.8*  2.9*  < > 2.9*  < > 3.1* 3.1* 3.2*  < > = values in this interval not displayed.   Recent Labs Lab 06/14/17 0342 06/15/17 0317 06/16/17 0350 06/12/2017 0317 06/18/17 0405  WBC 20.2* 16.3* 17.9* 17.8* 18.9*  HGB 8.6* 7.6*  7.3* 8.3* 8.2*  HCT 25.2* 22.5* 21.3* 24.5* 25.1*  MCV 84.0 83.6 84.9 84.8 86.6  PLT 194 175 168 200 194    Recent Labs Lab 07/01/2017 1617 06/29/2017 2030 06/18/17 0001 06/18/17 0434 06/18/17 0740  GLUCAP 236* 209* 221* 201* 196*    Studies/Results: Dg Chest Port 1 View  Result Date: 06/18/2017 CLINICAL DATA:  Check endotracheal tube placement EXAM: PORTABLE CHEST 1 VIEW COMPARISON:  07/10/2017 FINDINGS: Endotracheal tube and nasogastric catheter are noted in satisfactory position. Swan-Ganz catheter is been removed. Right jugular sheath remains. A left jugular dialysis catheter is again noted and stable. Left thoracostomy catheter and mediastinal drain have also been removed. No recurrent pneumothorax is seen. Bilateral parenchymal opacities are again noted consistent with pulmonary edema. Mild  bibasilar atelectatic changes are noted as well. IMPRESSION: Tubes and lines as described. Stable changes of edema and bibasilar atelectasis. Electronically Signed   By: Inez Catalina M.D.   On: 06/18/2017 07:53   Dg Chest Port 1 View  Result Date: 06/15/2017 CLINICAL DATA:  Endotracheal tube. Chest tube. History of CABG and pulmonary edema. EXAM: PORTABLE CHEST 1 VIEW COMPARISON:  06/16/2017; 06/15/2017; 06/14/2017 FINDINGS: Grossly unchanged enlarged cardiac silhouette and mediastinal contours post median sternotomy. Atherosclerotic plaque in the thoracic aorta. Stable position of support apparatus. No pneumothorax. Pulmonary vasculature remains indistinct with cephalization of flow. Grossly unchanged bilateral infrahilar and bibasilar heterogeneous airspace opacities. No new focal airspace opacities. No definite pleural effusion or pneumothorax. No acute osseus abnormalities. IMPRESSION: 1.  Stable positioning of support apparatus.  No pneumothorax. 2. Similar findings of pulmonary edema and bibasilar opacities, atelectasis versus infiltrate. Electronically Signed   By: Sandi Mariscal M.D.   On: 06/14/2017  07:13   Medications: Infusions: . sodium chloride Stopped (06/12/17 1900)  . sodium chloride Stopped (06/10/17 0700)  . sodium chloride 10 mL/hr at 06/18/17 0900  . albumin human 12.5 g (06/15/17 0327)  . amiodarone 30 mg/hr (06/18/17 0900)  . fentaNYL infusion INTRAVENOUS Stopped (07/05/2017 0800)  . lactated ringers    . lactated ringers Stopped (06/19/2017 0400)  . milrinone 0.25 mcg/kg/min (06/18/17 0917)  . norepinephrine (LEVOPHED) Adult infusion 16 mcg/min (06/18/17 0900)  . dialysis replacement fluid (prismasate) 300 mL/hr at 06/16/2017 2224  . dialysis replacement fluid (prismasate) 300 mL/hr at 07/04/2017 2224  . dialysate (PRISMASATE) 1,500 mL/hr at 06/18/17 0630  . sodium chloride    . sodium phosphate  Dextrose 5% IVPB      Scheduled Medications: . aspirin EC  325 mg Oral Daily   Or  . aspirin  324 mg Per Tube Daily  . atorvastatin  80 mg Oral q1800  . bisacodyl  10 mg Oral Daily   Or  . bisacodyl  10 mg Rectal Daily  . chlorhexidine gluconate (MEDLINE KIT)  15 mL Mouth Rinse BID  . Chlorhexidine Gluconate Cloth  6 each Topical q morning - 10a  . darbepoetin (ARANESP) injection - DIALYSIS  100 mcg Intravenous Q Mon-HD  . docusate  200 mg Oral Daily  . enoxaparin (LOVENOX) injection  30 mg Subcutaneous Daily  . feeding supplement (NEPRO CARB STEADY)  1,000 mL Per Tube Q24H  . insulin aspart  0-24 Units Subcutaneous Q4H  . insulin glargine  20 Units Subcutaneous BID  . levalbuterol  0.63 mg Nebulization Q6H  . mouth rinse  15 mL Mouth Rinse 10 times per day  . metoCLOPramide (REGLAN) injection  10 mg Intravenous Q6H  . metoprolol tartrate  12.5 mg Oral BID   Or  . metoprolol tartrate  12.5 mg Per Tube BID  . pantoprazole sodium  40 mg Per Tube QHS  . sodium chloride flush  10-40 mL Intracatheter Q12H  . sodium chloride flush  3 mL Intravenous Q12H   Background 76 y.o. male with past medical history hypertension, diabetes mellitus, OSA, chronic low back pain.  Presented 7/25 with SOB/HF symptoms,  troponin was elevated, creatinine 1.4. Cath done 7/27 (w/creatinine 1.96 and after that creatinine improved to 1.1-1.2) ->3V ds. CABG 7/29 with progressive rise in creatinine post op, development of oliguria, failed extubation, marked volume overload. Renal was asked to see. Failed lasix challenge, CRRT initiated 8/2.   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  BL creatinine around 1.4 Urinalysis bland. Given the  scenario likely ATN from hemodynamic cause. Minimal UOP at this time.  Patient started CRRT on 8/2.  Still volume overloaded, will try and remove more fluid as patient's blood pressure allows. Currently pulling about 150cc/hr. So far weight down from 130kg->116.2 kg with adm weight noted to be 104.3 kg Plan -continue CRRT  Hypertension Patient is tolerating CRRT well, will continue as patient is still volume overloaded.  Patient still requiring pressors.  Still with massive edema.  Weight down 14kg since start of CRRT. - continue CRRT  Electrolytes/phosphotemia   potassium okay, sodium down likely secondary to volume- phos is decreasing - repleted 8/4 and 8/6.  Will repleat again today as phos is 2.3 Plan -since requiring frequent replacement, will start daily dose of 20 mm phos as sodium phosphate  Normocytic Anemia   Situational secondary to surgery- supportive care and transfuse as needed, Transfused 1 unit of PRBC on 8/5.  Today hemoglobin 8.2 and stable.  Iron -13, TIBC -154, saturation ratio 8. Patient started on Aranesp 100 mcg. Will need IV Fe for tsat 8% once off ATB's Plan -aranesp 168mg weekly     Acute respiratory failure Required re intubation on 8/3. Pulm edema + pos HCAP. [place] through 8/7. Resp cx 8/5 with no growth to date.  MSSA nasal swab positive. PCCM starting vanc/zosyn  S/p 3V CABG 05/26/2017 - per Dr. VDarcey Nora Acute encephalopathy Patient's sedation has been off since yesterday and he only withdraws to pain.  Plan is for EEG  and Head CT  JBoyd Kerbs PGY-2, Internal medicine Pager: [phone]/05/2017,9:22 AM  LOS: 13 days     I have seen and examined this patient and agree with plan and assessment in the above note with renal recommendations/intervention highlighted. Continue current CRRT. Stasrt daily phos replacement. No indication at this time of renal recovery. Shawni Volkov B,MD 06/18/2017 10:22 AM

## 2017-06-18 NOTE — Progress Notes (Signed)
[facility] / CRITICAL CARE MEDICINE   Name: Philip Love MRN: [id number] DOB: 10/09/1941    ADMISSION DATE:  05/13/2017 CONSULTATION DATE:  06/11/2017  REFERRING MD:  Morton Peters  CHIEF COMPLAINT:  Respiratory Decompensation/ vent management  Brief Patient Summary:   77 year old obese former smoker with OSA , and insulin-dependent diabetes recently moved from [place] to [facility]. He presented to the emergency department there with 2 weeks of progressive shortness of breath ankle swelling and chest discomfort. He had nonspecific EKG changes but was in sinus rhythm. Cardiac enzymes were mildly positive and chest x-ray showed mild CHF. He was transferred to this hospital. His creatinine was 2. Echocardiogram showed EF of 45%. No significant MR. Creatinine improved to 1.7 and he underwent coronary angiography and right heart cath. Coronary showed a 90% left main stenosis, proximal 80-90% LAD stenosis, 90% stenosis of the mid RCA and 80% stenosis of the proximal circumflex. LVEDP is 12. Right heart cath showed cardiac output of 5.5 L/m with normal right-sided pressures. The patient was admitted to the ICU placed on IV heparin and nitroglycerin. His creatinine post cath remains at 1.7.  Blood sugars are fairly well controlled.Patient underwent CABG x 3 with TEE using Left internal mammary artery and right saphenous leg vein on  06/05/2017. He was recovering in the CVICU, was extubated 06/10/2017. He refused CPAP the night of 7/30-7/31> He was in atrial fibrillation and when bolused with Amio he had a rhythm change with long pauses and significant change in mental status requiring emergent re-intubation per Dr. Delton Coombes 7/31. ABG showed a metabolic acidosis.( 7.311/ 41.5/390/22/ 21.1)  CCM consulted for vent management and evaluation of respiratory decompensation.   SUBJECTIVE:  Sedation off since 0800 on 08/06 and pt remains unresponsive (only withdraws to pain). -3.67 L over past 24 hours and +  4.26 L net. Coox 59 this AM.  VITAL SIGNS: BP (!) 118/40   Pulse 74   Temp 98.4 F (36.9 C)   Resp (!) 22   Ht 5\' 8"  (1.727 m)   Wt 116.2 kg (256 lb 2.8 oz)   SpO2 96%   BMI 38.95 kg/m   HEMODYNAMICS: PAP: (40-45)/(16-23) 40/16  VENTILATOR SETTINGS: Vent Mode: PRVC FiO2 (%):  [40 %-50 %] 40 % Set Rate:  [22 bmp] 22 bmp Vt Set:  [550 mL] 550 mL PEEP:  [5 cmH20] 5 cmH20 Plateau Pressure:  [18 cmH20-30 cmH20] 18 cmH20  INTAKE / OUTPUT: I/O last 3 completed shifts: In: 3290.6 [I.V.:1892.3; Other:50; NG/GT:1088.3; IV Piggyback:260] Out: 8764 [Urine:40; MVHQI:6962; Chest Tube:40]  PHYSICAL EXAMINATION:  General:  Obese man, in NAD HEENT: Endotracheal tube in place, no oral lesions noted, PERRL Neuro: Unresponsive, withdraws to pain CV: Irregular, not currently paced PULM: Coarse bilaterally, bibasilar inspiratory crackles GI: Obese, soft, nontender, bowel sounds present Extremities: 2+ lower extremity edema to the thigh, general anasarca Skin: No rash, some bullous weeping lesions  LABS:  BMET  Recent Labs Lab 07/11/17 0317 July 11, 2017 1632 06/18/17 0405  NA 133* 136 134*  K 4.0 4.0 4.1  CL 100* 101 98*  CO2 24 24 24   BUN 16 15 16   CREATININE 2.08* 1.82* 1.80*  GLUCOSE 189* 232* 287*    Electrolytes  Recent Labs Lab 06/16/17 0350  07/11/17 0317 2017-07-11 1632 06/18/17 0405  CALCIUM 7.4*  < > 7.7* 7.6* 7.7*  MG 2.5*  --  2.5*  --  2.7*  PHOS  --   < > 2.1* 3.2 2.3*  < > =  values in this interval not displayed.  CBC  Recent Labs Lab 06/16/17 0350 06/26/2017 0317 06/18/17 0405  WBC 17.9* 17.8* 18.9*  HGB 7.3* 8.3* 8.2*  HCT 21.3* 24.5* 25.1*  PLT 168 200 194    Coag's No results for input(s): APTT, INR in the last 168 hours.  Sepsis Markers  Recent Labs Lab 06/12/17 0905 06/12/17 1832  LATICACIDVEN 2.5* 1.4    ABG  Recent Labs Lab 06/15/17 1609 06/16/17 0348 06/29/2017 0333  PHART 7.418 7.399 7.393  PCO2ART 36.7 39.1 41.2  PO2ART  87.0 93.0 98.0    Liver Enzymes  Recent Labs Lab 06/15/17 0317  06/16/17 0350  06/21/2017 0317 06/19/2017 1632 06/18/17 0405  AST 31  --  37  --   --   --  56*  ALT 17  --  17  --   --   --  19  ALKPHOS 114  --  147*  --   --   --  325*  BILITOT 1.1  --  1.3*  --   --   --  1.1  ALBUMIN 2.8*  2.9*  < > 2.9*  < > 3.1* 3.1* 3.2*  < > = values in this interval not displayed.  Cardiac Enzymes No results for input(s): TROPONINI, PROBNP in the last 168 hours.  Glucose  Recent Labs Lab 06/13/2017 0725 06/24/2017 1059 07/10/2017 1617 06/27/2017 2030 06/18/17 0001 06/18/17 0434  GLUCAP 182* 230* 236* 209* 221* 201*    Imaging No results found.  STUDIES:  Echo 06/06/2017>>EF 40-45%, Moderate LVH, Mildly to moderately  reduced systolic dysfunction, MV mild regurgitation, LA mildly dilated 7/27 LHC/RHC >>90% left main stenosis, proximal 80-90% LAD stenosis, 90% stenosis of the mid RCA and 80% stenosis of the proximal circumflex. LVEDP is 12, cardiac output of 5.5 L/m with normal right-sided pressures. EEG 8/7 >  CT head 8/7 >   CULTURES: 7/27 MRSA PCR >> negative Sputum 7/31>> multiple organism present, none predominant Respiratory 8/3 >>   ANTIBIOTICS: Ceftazidine 7/31>> 8/7 Zinacef 7/29 preop  SIGNIFICANT EVENTS: 05/18/2017>> CABG 06/10/2017>> Extubation 06/11/2017>> Emergent re-intubation for mixed respiratory/metabolic acidosis 06/12/2017 > self extubated -> BiPAP 06/13/17 > CRRT  LINES/TUBES: R IJ Swan 7/29 >> Left and Mediastinal CT>> 7/29 R Radial A Line 7/29 >>8/1 ETT 7/31 >> 8/1 Left femoral Aline 8/1 > L IJ HD cath 8/2 >>  DISCUSSION:  ASSESSMENT / PLAN:  PULMONARY A: Acute hypoxic respiratory failure Mixed Respiratory/Metabolic Acidosis  R/o HCAP/ LLL opacity Suspected OSA/OHS  P:   Continue mechanical ventilation - 8 mL/kg He will not be in a position for spontaneous breathing until more volume removed with CRRT and until mental status improved Wean FiO2  and PEEP as able for SpO2 > 92% Follow sputum cultures Abx to d/c today after 8 days CXR in AM  CARDIOVASCULAR A:  Cardiogenic shock Afib/ Bradycardia (on amiodarone) 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 Back pain P:   Fentanyl gtt / Midazolam PRN RASS goal 0 to -1 Daily wakeup assessment Assess EEG. Assess head CT. Will likely need neuro consult pending above results.   FAMILY  - Updates: updated at bedside on 8/6  - Inter-disciplinary family meet or Palliative Care meeting due by: 06/24/2017.   CC time: 30 min.   Rutherford Guysahul Pat Sires, GeorgiaPA - [facility] Pager: [phone]  or [phone] 06/18/2017, 7:43 AM

## 2017-06-18 NOTE — Op Note (Signed)
NAMMancel Bale:  Grimme, Thorsten              ACCOUNT NO.:  [id number]  MEDICAL RECORD NO.:  [id number]  LOCATION:  [id number]                        FACILITY:  [facility]  PHYSICIAN:  Kerin PernaPeter Van Trigt, M.D.  DATE OF BIRTH:  1941-07-08  DATE OF PROCEDURE:  06/18/2017 DATE OF DISCHARGE:                              OPERATIVE REPORT   OPERATION:  Placement of right subclavian vein triple-lumen catheter.  SURGEON:  Kerin PernaPeter Van Trigt, M.D.  PREOPERATIVE DIAGNOSES:  Status post coronary artery bypass graft with postoperative acute-on-chronic renal failure, postoperative acute respiratory insufficiency.  POSTOPERATIVE DIAGNOSES:  Status post coronary artery bypass graft with postoperative acute-on-chronic renal failure, postoperative acute respiratory insufficiency.  ANESTHESIA:  IV sedation and 1% local lidocaine.  PROCEDURE:  After informed consent had been obtained from the patient's family, the right chest and neck was prepped and draped as a sterile field.  The patient was sedated.  A proper time-out was performed.  1% lidocaine was infiltrated beneath the distal third of the right clavicle.  Using the Seldinger technique, the right subclavian vein was cannulated and a guidewire was passed proximally to the right atrium. Over the guidewire dilator, then the triple-lumen catheter was passed without difficulty.  The catheter was flushed, capped, and secured to the skin and a sterile dressing was applied.  A portable chest x-ray was pending.     Kerin PernaPeter Van Trigt, M.D.     PV/MEDQ  D:  06/18/2017  T:  06/18/2017  Job:  [id number]

## 2017-06-18 NOTE — Progress Notes (Signed)
Nutrition Follow Up  DOCUMENTATION CODES:   Morbid obesity  INTERVENTION:    Nepro at goal rate of 10 ml/hr with Prostat liquid protein 60 ml QID  Total TF regimen to provide 1232 kcals, 139 gm protein, 174 ml of free water  NUTRITION DIAGNOSIS:   Inadequate oral intake related to inability to eat as evidenced by NPO status, ongoing   GOAL:   Provide needs based on ASPEN/SCCM guidelines, progressing  MONITOR:   Vent status, TF tolerance, Labs, Weight trends, Skin, I & O's  ASSESSMENT:   75 yo obese former smoker with OSA , and insulin-dependent diabetes [place] [place] He presented to the emergency department there with 2 weeks of progressive shortness of breath ankle swelling and chest discomfort.   Patient is currently intubated on ventilator support Temp (24hrs), Avg:98.1 F (36.7 C), Min:97.5 F (36.4 C), Max:98.5 F (36.9 C)  Pt s/p CABG 7/29. Emergently re-intubated for mixed respiratory/metabolic acidosis. Nepro formula initiated per MD 8/6. Currently infusing at 40 ml/hr via OGT.  Medications reviewed and include Reglan and multiple pressors. Labs reviewed. Mg 2.7 (H). Na 134 (L). CBG's 221-201-196. Volume overloaded. Continues on CRRT.  Verbal with Read Back order received per Dr. Donata ClayVan Trigt to change TF regimen.  Diet Order:  Diet NPO time specified  Skin:  Reviewed, no issues  Last BM:  8/6  Height:   Ht Readings from Last 1 Encounters:  06/14/17 5\' 8"  (1.727 m)   Weight:   Wt Readings from Last 1 Encounters:  06/18/17 256 lb 2.8 oz (116.2 kg)  05/22/2017         230 lb (104.3 kg)  Ideal Body Weight:  70 kg  BMI:  Body mass index is 38.95 kg/m. >> highly skewed   Estimated Nutritional Needs (using admission weight):  Kcal:  [id number]  Protein:  >/= 139 gm  Fluid:  per MD  EDUCATION NEEDS:   No education needs identified at this time  Maureen ChattersKatie Rupal Childress, RD, LDN Pager #:  [phone] After-Hours Pager #: [phone]

## 2017-06-18 NOTE — Progress Notes (Signed)
Patient ID: Philip DutchRobert Love, male   DOB: October 22, 1941, 76 y.o.   MRN: [id number] EVENING ROUNDS NOTE :     [address]       [place] [id number]             [phone]                 9 Days Post-Op Procedure(s) (LRB): CORONARY ARTERY BYPASS GRAFTING (CABG) x3 WITH TEE USING LEFT INTERNAL MAMMARY ARTERY AND RIGHT  SAPHENOUS LEG VEIN HARVESTED ENDOSCOPICALLY (N/A)  Total Length of Stay:  LOS: 13 days  BP (!) 108/43   Pulse 65   Temp (!) 96.9 F (36.1 C) (Axillary)   Resp 19   Ht 5\' 8"  (1.727 m)   Wt 256 lb 2.8 oz (116.2 kg)   SpO2 100%   BMI 38.95 kg/m   .Intake/Output      08/06 0701 - 08/07 0700 08/07 0701 - 08/08 0700   I.V. (mL/kg) 1143.2 (9.8) 624.4 (5.4)   Other 50    NG/GT 788.3 432.7   IV Piggyback 260 860   Total Intake(mL/kg) 2241.5 (19.3) 1917.1 (16.5)   Urine (mL/kg/hr) 15 (0) 12 (0)   Other 5898 3489   Chest Tube 0    Total Output 5913 3501   Net -3671.5 -1583.9        Stool Occurrence 1 x 1 x     . sodium chloride Stopped (06/12/17 1900)  . sodium chloride Stopped (06/10/17 0700)  . sodium chloride Stopped (06/18/17 1000)  . albumin human 12.5 g (06/15/17 0327)  . amiodarone 30 mg/hr (06/18/17 1700)  . fentaNYL infusion INTRAVENOUS Stopped (06/24/2017 0800)  . lactated ringers    . lactated ringers Stopped (06/15/2017 0400)  . milrinone 0.25 mcg/kg/min (06/18/17 1800)  . norepinephrine (LEVOPHED) Adult infusion 12 mcg/min (06/18/17 1751)  . piperacillin-tazobactam (ZOSYN)  IV 3.375 g (06/18/17 1620)  . dialysis replacement fluid (prismasate) 300 mL/hr at 06/18/17 1630  . dialysis replacement fluid (prismasate) 300 mL/hr at 06/18/17 1630  . dialysate (PRISMASATE) 1,500 mL/hr at 06/18/17 1418  . sodium chloride    . [START ON 06/19/2017] sodium phosphate  Dextrose 5% IVPB    . [START ON 06/19/2017] vancomycin       Lab Results  Component Value Date   WBC 18.9 (H) 06/18/2017   HGB 8.2 (L) 06/18/2017   HCT 25.1 (L) 06/18/2017   PLT 194  06/18/2017   GLUCOSE 192 (H) 06/18/2017   CHOL 125 06/08/2017   TRIG 173 (H) 06/08/2017   HDL 34 (L) 06/08/2017   LDLCALC 56 06/08/2017   ALT 19 06/18/2017   AST 56 (H) 06/18/2017   NA 137 06/18/2017   K 4.0 06/18/2017   CL 102 06/18/2017   CREATININE 1.79 (H) 06/18/2017   BUN 17 06/18/2017   CO2 27 06/18/2017   TSH 3.808 06/08/2017   INR 1.47 06/08/2017   HGBA1C 6.3 (H) 06/08/2017   Remains unresponsive on vent cvvh Discussed ct of head results with patient s family   Philip Ovensdward B Selah Zelman MD  [place] [phone] Office [phone] 06/18/2017 6:16 PM

## 2017-06-19 ENCOUNTER — Inpatient Hospital Stay (HOSPITAL_COMMUNITY): Payer: Medicare Other

## 2017-06-19 LAB — GLUCOSE, CAPILLARY
GLUCOSE-CAPILLARY: 176 mg/dL — AB (ref 65–99)
GLUCOSE-CAPILLARY: 203 mg/dL — AB (ref 65–99)
GLUCOSE-CAPILLARY: 205 mg/dL — AB (ref 65–99)
Glucose-Capillary: 164 mg/dL — ABNORMAL HIGH (ref 65–99)
Glucose-Capillary: 152 mg/dL — ABNORMAL HIGH (ref 65–99)
Glucose-Capillary: 146 mg/dL — ABNORMAL HIGH (ref 65–99)

## 2017-06-19 LAB — CBC
HCT: 25.4 % — ABNORMAL LOW (ref 39.0–52.0)
Hemoglobin: 8.2 g/dL — ABNORMAL LOW (ref 13.0–17.0)
MCH: 28.7 pg (ref 26.0–34.0)
MCHC: 32.3 g/dL (ref 30.0–36.0)
MCV: 88.8 fL (ref 78.0–100.0)
Platelets: 205 10*3/uL (ref 150–400)
RBC: 2.86 MIL/uL — ABNORMAL LOW (ref 4.22–5.81)
RDW: 17 % — ABNORMAL HIGH (ref 11.5–15.5)
WBC: 17.5 10*3/uL — ABNORMAL HIGH (ref 4.0–10.5)

## 2017-06-19 LAB — COMPREHENSIVE METABOLIC PANEL
ALT: 22 U/L (ref 17–63)
AST: 50 U/L — ABNORMAL HIGH (ref 15–41)
Albumin: 3.4 g/dL — ABNORMAL LOW (ref 3.5–5.0)
Alkaline Phosphatase: 299 U/L — ABNORMAL HIGH (ref 38–126)
Anion gap: 11 (ref 5–15)
BUN: 23 mg/dL — ABNORMAL HIGH (ref 6–20)
CO2: 26 mmol/L (ref 22–32)
Calcium: 8 mg/dL — ABNORMAL LOW (ref 8.9–10.3)
Chloride: 100 mmol/L — ABNORMAL LOW (ref 101–111)
Creatinine, Ser: 1.67 mg/dL — ABNORMAL HIGH (ref 0.61–1.24)
GFR calc Af Amer: 45 mL/min — ABNORMAL LOW (ref >60)
GFR calc non Af Amer: 39 mL/min — ABNORMAL LOW (ref >60)
Glucose, Bld: 170 mg/dL — ABNORMAL HIGH (ref 65–99)
Potassium: 3.8 mmol/L (ref 3.5–5.1)
Sodium: 137 mmol/L (ref 135–145)
Total Bilirubin: 1.1 mg/dL (ref 0.3–1.2)
Total Protein: 6.2 g/dL — ABNORMAL LOW (ref 6.5–8.1)

## 2017-06-19 LAB — COOXEMETRY PANEL
Carboxyhemoglobin: 1.4 % (ref 0.5–1.5)
Methemoglobin: 0.9 % (ref 0.0–1.5)
O2 Saturation: 57.5 %
Total hemoglobin: 8.1 g/dL — ABNORMAL LOW (ref 12.0–16.0)

## 2017-06-19 LAB — RENAL FUNCTION PANEL
ALBUMIN: 3.4 g/dL — AB (ref 3.5–5.0)
ANION GAP: 9 (ref 5–15)
Albumin: 3.8 g/dL (ref 3.5–5.0)
Anion gap: 12 (ref 5–15)
BUN: 24 mg/dL — AB (ref 6–20)
BUN: 31 mg/dL — AB (ref 6–20)
CHLORIDE: 98 mmol/L — AB (ref 101–111)
CO2: 28 mmol/L (ref 22–32)
CO2: 26 mmol/L (ref 22–32)
CREATININE: 1.76 mg/dL — AB (ref 0.61–1.24)
Calcium: 8.1 mg/dL — ABNORMAL LOW (ref 8.9–10.3)
Calcium: 8.4 mg/dL — ABNORMAL LOW (ref 8.9–10.3)
Chloride: 101 mmol/L (ref 101–111)
Creatinine, Ser: 1.64 mg/dL — ABNORMAL HIGH (ref 0.61–1.24)
GFR calc Af Amer: 42 mL/min — ABNORMAL LOW (ref >60)
GFR calc non Af Amer: 37 mL/min — ABNORMAL LOW (ref >60)
GFR, EST AFRICAN AMERICAN: 46 mL/min — AB (ref >60)
GFR, EST NON AFRICAN AMERICAN: 40 mL/min — AB (ref >60)
Glucose, Bld: 170 mg/dL — ABNORMAL HIGH (ref 65–99)
Glucose, Bld: 210 mg/dL — ABNORMAL HIGH (ref 65–99)
POTASSIUM: 3.9 mmol/L (ref 3.5–5.1)
Phosphorus: 2.8 mg/dL (ref 2.5–4.6)
Phosphorus: 2.9 mg/dL (ref 2.5–4.6)
Potassium: 3.8 mmol/L (ref 3.5–5.1)
Sodium: 138 mmol/L (ref 135–145)
Sodium: 136 mmol/L (ref 135–145)

## 2017-06-19 LAB — MAGNESIUM: MAGNESIUM: 2.6 mg/dL — AB (ref 1.7–2.4)

## 2017-06-19 LAB — C DIFFICILE QUICK SCREEN W PCR REFLEX
C Diff antigen: NEGATIVE
C Diff interpretation: NOT DETECTED
C Diff toxin: NEGATIVE

## 2017-06-19 LAB — URINE CULTURE: Culture: NO GROWTH

## 2017-06-19 LAB — VANCOMYCIN, TROUGH: Vancomycin Tr: 16 ug/mL (ref 15–20)

## 2017-06-19 MED ORDER — ATORVASTATIN CALCIUM 80 MG PO TABS
80.0000 mg | ORAL_TABLET | Freq: Every day | ORAL | Status: DC
  Administered 2017-06-19 – 2017-06-24 (×6): 80 mg
  Filled 2017-06-19 (×6): qty 1

## 2017-06-19 MED ORDER — VANCOMYCIN HCL IN DEXTROSE 750-5 MG/150ML-% IV SOLN
750.0000 mg | INTRAVENOUS | Status: DC
  Administered 2017-06-20 – 2017-06-21 (×2): 750 mg via INTRAVENOUS
  Filled 2017-06-19 (×3): qty 150

## 2017-06-19 MED ORDER — METOPROLOL TARTRATE 12.5 MG HALF TABLET: 12.5000 mg | ORAL_TABLET | Freq: Two times a day (BID) | ORAL | Status: DC

## 2017-06-19 MED ORDER — METOPROLOL TARTRATE 25 MG/10 ML ORAL SUSPENSION
12.5000 mg | Freq: Two times a day (BID) | ORAL | Status: DC
  Administered 2017-06-19 – 2017-06-21 (×5): 12.5 mg
  Filled 2017-06-19 (×5): qty 5

## 2017-06-19 MED ORDER — AMIODARONE HCL 200 MG PO TABS
200.0000 mg | ORAL_TABLET | Freq: Every day | ORAL | Status: DC
  Administered 2017-06-19 – 2017-06-22 (×4): 200 mg
  Filled 2017-06-19 (×4): qty 1

## 2017-06-19 MED ORDER — AMIODARONE HCL 200 MG PO TABS: 200.0000 mg | ORAL_TABLET | Freq: Every day | ORAL | Status: DC

## 2017-06-19 MED ORDER — ENOXAPARIN SODIUM 40 MG/0.4ML ~~LOC~~ SOLN
40.0000 mg | Freq: Every day | SUBCUTANEOUS | Status: DC
  Administered 2017-06-19 – 2017-06-21 (×3): 40 mg via SUBCUTANEOUS
  Filled 2017-06-19 (×3): qty 0.4

## 2017-06-19 MED ORDER — ACETAMINOPHEN 500 MG PO TABS
1000.0000 mg | ORAL_TABLET | Freq: Four times a day (QID) | ORAL | Status: DC | PRN
  Administered 2017-06-22: 1000 mg
  Filled 2017-06-19: qty 2

## 2017-06-19 NOTE — Progress Notes (Signed)
      [address]       [place] [id number]             [phone]      Remains intubated. Back on PRVC after CPAP earlier  BP (!) 119/49   Pulse 93   Temp 98.4 F (36.9 C) (Axillary)   Resp (!) 23   Ht 5\' 8"  (1.727 m)   Wt 247 lb 2.2 oz (112.1 kg)   SpO2 100%   BMI 37.58 kg/m    Intake/Output Summary (Last 24 hours) at 06/19/17 1823 Last data filed at 06/19/17 1810  Gross per 24 hour  Intake             2289 ml  Output             6435 ml  Net            -4146 ml   No new issues. Continue current care  Viviann SpareSteven C. Dorris FetchHendrickson, MD [facility] [phone]

## 2017-06-19 NOTE — Progress Notes (Signed)
[facility] / CRITICAL CARE MEDICINE   Name: Philip Love MRN: [id number] DOB: Dec 30, 1940    ADMISSION DATE:  Jun 24, 2017 CONSULTATION DATE:  06/11/2017  REFERRING MD:  Morton Peters  CHIEF COMPLAINT:  Respiratory Decompensation/ vent management  Brief Patient Summary:   76 year old obese former smoker with OSA , and insulin-dependent diabetes recently moved from [place] to [facility]. He presented to the emergency department there with 2 weeks of progressive shortness of breath ankle swelling and chest discomfort. He had nonspecific EKG changes but was in sinus rhythm. Cardiac enzymes were mildly positive and chest x-ray showed mild CHF. He was transferred to this hospital. His creatinine was 2. Echocardiogram showed EF of 45%. No significant MR. Creatinine improved to 1.7 and he underwent coronary angiography and right heart cath. Coronary showed a 90% left main stenosis, proximal 80-90% LAD stenosis, 90% stenosis of the mid RCA and 80% stenosis of the proximal circumflex. LVEDP is 12. Right heart cath showed cardiac output of 5.5 L/m with normal right-sided pressures. The patient was admitted to the ICU placed on IV heparin and nitroglycerin. His creatinine post cath remains at 1.7.  Blood sugars are fairly well controlled.Patient underwent CABG x 3 with TEE using Left internal mammary artery and right saphenous leg vein on  06/08/2017. He was recovering in the CVICU, was extubated 06/10/2017. He refused CPAP the night of 7/30-7/31> He was in atrial fibrillation and when bolused with Amio he had a rhythm change with long pauses and significant change in mental status requiring emergent re-intubation per Dr. Delton Coombes 7/31. ABG showed a metabolic acidosis.( 7.311/ 41.5/390/22/ 21.1)  CCM consulted for vent management and evaluation of respiratory decompensation.   SUBJECTIVE:   Given fentanyl 25 mcg around 0700 for biting on ETT Weaning PSV 5/5 since 0700 Remains unresponsive, intermittently  responses to pain CRRT continues NF goal 146ml/hr, UOP 28ml/12hr Coox 57 this AM. Remains on milrinone, amio, levophed at 15 mcg/min  VITAL SIGNS: BP (!) 120/39   Pulse (!) 57   Temp (!) 97.3 F (36.3 C) (Oral)   Resp (!) 22   Ht 5\' 8"  (1.727 m)   Wt 247 lb 2.2 oz (112.1 kg)   SpO2 100%   BMI 37.58 kg/m   HEMODYNAMICS: CVP:  [12 mmHg-13 mmHg] 12 mmHg  VENTILATOR SETTINGS: Vent Mode: PRVC FiO2 (%):  [40 %] 40 % Set Rate:  [22 bmp] 22 bmp Vt Set:  [550 mL] 550 mL PEEP:  [5 cmH20] 5 cmH20 Plateau Pressure:  [15 cmH20-19 cmH20] 15 cmH20  INTAKE / OUTPUT: I/O last 3 completed shifts: In: 3812.5 [I.V.:1636.9; Other:240; NG/GT:932.7; IV Piggyback:1003] Out: 9763 [Urine:27; Other:9736]  PHYSICAL EXAMINATION:  General:  Obese male, NAD on MV HEENT: MM pink/moist, ETT/OGT, pupils 3/very sluggish, sclerae anicteric, + corneals Neuro: Opens eyes to voice, does not respond to pain, no movement noted, breathing above vent CV: IRIR, not paced PULM: even/non-labored on PSV 5/5 at 15, diminished on right and left base, otherwise coarse GI: obese, soft, hyper BS Extremities: warm/dry, diffuse anasarca Skin: no rash, weeping lesions to BUE  LABS:  BMET  Recent Labs Lab 06/18/17 0405 06/18/17 1410 06/19/17 0419  NA 134* 137 137  138  K 4.1 4.0 3.8  3.9  CL 98* 102 100*  101  CO2 24 27 26  28   BUN 16 17 23*  24*  CREATININE 1.80* 1.79* 1.67*  1.64*  GLUCOSE 287* 192* 170*  170*    Electrolytes  Recent Labs Lab 07/03/2017  4098  06/18/17 0405 06/18/17 1410 06/19/17 0419  CALCIUM 7.7*  < > 7.7* 7.8* 8.0*  8.1*  MG 2.5*  --  2.7*  --  2.6*  PHOS 2.1*  < > 2.3* 2.2* 2.8  < > = values in this interval not displayed.  CBC  Recent Labs Lab 06/16/2017 0317 06/18/17 0405 06/19/17 0419  WBC 17.8* 18.9* 17.5*  HGB 8.3* 8.2* 8.2*  HCT 24.5* 25.1* 25.4*  PLT 200 194 205    Coag's No results for input(s): APTT, INR in the last 168 hours.  Sepsis  Markers  Recent Labs Lab 06/12/17 0905 06/12/17 1832  LATICACIDVEN 2.5* 1.4    ABG  Recent Labs Lab 06/15/17 1609 06/16/17 0348 06/22/2017 0333  PHART 7.418 7.399 7.393  PCO2ART 36.7 39.1 41.2  PO2ART 87.0 93.0 98.0    Liver Enzymes  Recent Labs Lab 06/16/17 0350  06/18/17 0405 06/18/17 1410 06/19/17 0419  AST 37  --  56*  --  50*  ALT 17  --  19  --  22  ALKPHOS 147*  --  325*  --  299*  BILITOT 1.3*  --  1.1  --  1.1  ALBUMIN 2.9*  < > 3.2* 3.3* 3.4*  3.4*  < > = values in this interval not displayed.  Cardiac Enzymes No results for input(s): TROPONINI, PROBNP in the last 168 hours.  Glucose  Recent Labs Lab 06/18/17 0740 06/18/17 1122 06/18/17 1623 06/18/17 1949 06/18/17 2351 06/19/17 0452  GLUCAP 196* 187* 211* 181* 186* 164*    Imaging Ct Head Wo Contrast  Result Date: 06/18/2017 CLINICAL DATA:  76 y/o M; unexplained altered level of consciousness. EXAM: CT HEAD WITHOUT CONTRAST TECHNIQUE: Contiguous axial images were obtained from the base of the skull through the vertex without intravenous contrast. COMPARISON:  None. FINDINGS: Brain: Scattered small foci of hypoattenuation in periventricular white matter are nonspecific probably represent chronic microvascular ischemic changes. There is a lucency in the right cerebellar hemisphere which may represent an age indeterminate lacunar infarction. Additionally, there is hypoattenuation within the medial basal ganglia bilaterally which can be associated with anoxic or metabolic injury, also age indeterminate. No large vascular territory acute infarction, intracranial hemorrhage, hydrocephalus, or extra-axial collection. Vascular: Mild calcific atherosclerosis of carotid siphons. No hyperdense vessel. Skull: Normal. Negative for fracture or focal lesion. Sinuses/Orbits: Mild left maxillary sinus mucosal thickening. Small right sphenoid sinus fluid level and trace mastoid effusions, probably due to intubation.  Bilateral intra-ocular lens replacement. Other: None. IMPRESSION: 1. No acute large vascular territory infarction, intracranial hemorrhage, or focal mass effect. 2. Hypoattenuation bilateral medial basal ganglia can be associated with anoxic or metabolic injury, age indeterminate. 3. Right inferior cerebellar lucency compatible with age-indeterminate lacunar infarction. 4. Nonspecific foci of hypoattenuation in white matter compatible with microvascular ischemic changes. Mild brain parenchymal volume loss. Electronically Signed   By: Mitzi Hansen M.D.   On: 06/18/2017 13:44   Dg Chest Port 1 View  Result Date: 06/18/2017 CLINICAL DATA:  Right subclavian central line placement. EXAM: PORTABLE CHEST 1 VIEW COMPARISON:  Earlier the same day FINDINGS: 0924 hours. Endotracheal tube tip is approximately 6.2 cm above the base of the carina. The NG tube passes into the stomach although the distal tip position is not included on the film. Left IJ central line remains in place with tip overlying the innominate vein confluence and directed laterally, potentially resting against the wall of the vein. Right subclavian central line is new in the interval with  tip position near the SVC/ RA junction. Right IJ sheath remains in place. No evidence for pneumothorax. The cardiopericardial silhouette remains enlarged. Vascular congestion with diffuse interstitial opacity again noted suggesting edema. Probable basilar atelectasis bilaterally. Patient is status post median sternotomy. IMPRESSION: 1. New right subclavian central line tip overlies a position near the expected location of the SVC/RA junction. No evidence for pneumothorax. 2. Stable position of pre-existing tube/catheters. 3. Cardiomegaly with vascular congestion and diffuse interstitial opacities suggesting edema. 4. Basilar atelectasis. Electronically Signed   By: Kennith Center M.D.   On: 06/18/2017 09:42    STUDIES:  Echo 06/06/2017>>EF 40-45%, Moderate  LVH, Mildly to moderately  reduced systolic dysfunction, MV mild regurgitation, LA mildly dilated 7/27 LHC/RHC >>90% left main stenosis, proximal 80-90% LAD stenosis, 90% stenosis of the mid RCA and 80% stenosis of the proximal circumflex. LVEDP is 12, cardiac output of 5.5 L/m with normal right-sided pressures. EEG 8/7 > moderate diffuse slowing, no focal, hemispheric or lateralizing features, no epileptiform activity  CT head 8/7 > No acute large vascular territory infarction, intracranial hemorrhage, or focal mass effect; hypoattenuation bilateral medial basal ganglia can be associated/w anoxic or metabolic injury, age indeterminate; right inferior cerebellar lucency c/w age-indeterminate lacunar infarction; nonspecific foci of hypoattenuation in white matter compatible/w microvascular ischemic changes; mild brain parenchymal volume loss.  CULTURES: 7/27 MRSA PCR >> neg 7/27 surgical screen- urine >>MSSA postive Sputum 7/31>> multiple organism present, none predominant Respiratory 8/3 >> normal [facility] 8/7 >> Trach asp 8/7 >>  ANTIBIOTICS: Ceftazidine 7/31>> 8/7 Zinacef 7/29 preop Zosyn 8/7 >> Vanc 8/7 >>  SIGNIFICANT EVENTS: 05/26/2017>> CABG 06/10/2017>> Extubation 06/11/2017>> Emergent re-intubation for mixed respiratory/metabolic acidosis 06/12/2017 > self extubated -> BiPAP 06/13/17 > CRRT  LINES/TUBES: R IJ Swan 7/29 >> 8/6 Left and Mediastinal CT>> 7/29 R Radial A Line 7/29 >>8/1 ETT 7/31 >> 8/1 Left femoral Aline 8/1 >8/6 L IJ HD cath 8/2 >> R SVC CVL 8/7 >>  DISCUSSION:  ASSESSMENT / PLAN:  PULMONARY A: Acute hypoxic respiratory failure Mixed Respiratory/Metabolic Acidosis  R/o HCAP/ LLL opacity vs pulmonary edema Suspected OSA/OHS  P:   Continue mechanical ventilation - 8 mL/kg Continue daily SBT- but no extubation given mental status and will need more volume removed with CRRT  Wean FiO2 and PEEP as able for SpO2 > 92% Follow sputum cultures Abx to d/c today after  8 days Trend CXR   CARDIOVASCULAR A:  Cardiogenic shock Afib/ Bradycardia (on amiodarone) 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 P:   Hyperglycemia protocol  NEUROLOGIC A:   Acute encephalopathy Sedation for mechanical ventilation -  Back pain -all sedation since 0800 on 08/06 and remains unresponsive as of 0800 on 08/07 - given fentanyl 8/8- 25 mcg for biting on ETT/vent alarming - EEG with diffuse  slowing, no epileptiform activity - CT head hypoattenuation bilateral medial basal ganglia r/t anoxic vs metabolic injury P:   D/c versed Fentanyl to PRN- minimize sedation if at all possible RASS goal 0  Consider MRI of head/ neuro consult if neuro exam does not improve with holding sedation  FAMILY  - Updates: No family at bedside 8/8.  - Inter-disciplinary family meet or Palliative Care meeting due   CC time: 30 min  Posey BoyerBrooke Andre Gallego, AGACNP-BC [facility] Pgr: [phone] or if no answer [phone] 06/19/2017, 7:57 AM

## 2017-06-19 NOTE — Progress Notes (Signed)
Family conference was held in the ICU waiting room with the patient's wife and family, and the unit ICU Director. I reviewed the patient's condition and plan of care involving his cardiac, pulmonary, renal, and neuro function. The family and myself are concerned over his decreased responsiveness  since his IV sedation with fentanyl was stopped [after 3 days] on August 6. I reviewed the results of his head CT scan and EEG performed yesterday. We agreed to ask for a neurology consult in the a.m. if his neuro status shows no significant improvement. The family understands that even though his first head CT scan was negative for significant stroke the patient could have sustained a stroke that would become more apparent on later scans. The patient's family wishes that unless there is a reasonable chance for  recovery they do not feel the patient would want  prolonged aggressive therapy, which I understand. We'll plan on updating the patient's family tomorrow evening when out of surgery.

## 2017-06-19 NOTE — Progress Notes (Signed)
[facility] Rounding Note  Subjective:    Patient is intubated on the vent.  Unable to follow commands.  CRRT continues to run well.  Weight is down from a max of 130.1 kg to 112.1 kg today (adm weight 104 kg)  Objective Vital signs in last 24 hours: Vitals:   06/19/17 0600 06/19/17 0700 06/19/17 0732 06/19/17 0801  BP: (!) 128/42 (!) 120/39 (!) 167/48   Pulse: 60 (!) 57 75   Resp: 14 (!) 22 16   Temp:    (!) 97.1 F (36.2 C)  TempSrc:    Axillary  SpO2: 100% 100% 100%   Weight: 247 lb 2.2 oz (112.1 kg)     Height:       Weight change: -9 lb 0.6 oz (-4.1 kg)  Intake/Output Summary (Last 24 hours) at 06/19/17 0853 Last data filed at 06/19/17 0800  Gross per 24 hour  Intake          2697.49 ml  Output             6975 ml  Net         -4277.51 ml   Physical Exam: General: on vent Heart: RRR Sternotomy scar healing/clean and dry.   Lungs: rhonchi throughout Abdomen: obese Extremities: 2+ pitting edema in upper and lower extremities bilaterally  Access- left IJ vascath placed 8/2 New R [place] line (8/7) L great toe with some purplish discoloration/sl mottling Incisions legs healing (despite all the edema)  Labs:   Recent Labs Lab 06/18/17 0405 06/18/17 1410 06/19/17 0419  NA 134* 137 137  138  K 4.1 4.0 3.8  3.9  CL 98* 102 100*  101  CO2 24 27 26  28   GLUCOSE 287* 192* 170*  170*  BUN 16 17 23*  24*  CREATININE 1.80* 1.79* 1.67*  1.64*  CALCIUM 7.7* 7.8* 8.0*  8.1*  PHOS 2.3* 2.2* 2.8    Recent Labs Lab 06/16/17 0350  06/18/17 0405 06/18/17 1410 06/19/17 0419  AST 37  --  56*  --  50*  ALT 17  --  19  --  22  ALKPHOS 147*  --  325*  --  299*  BILITOT 1.3*  --  1.1  --  1.1  PROT 5.2*  --  6.1*  --  6.2*  ALBUMIN 2.9*  < > 3.2* 3.3* 3.4*  3.4*  < > = values in this interval not displayed.   Recent Labs Lab 06/15/17 0317 06/16/17 0350 07/10/2017 0317 06/18/17 0405 06/19/17 0419  WBC 16.3* 17.9* 17.8* 18.9* 17.5*  HGB 7.6* 7.3* 8.3* 8.2* 8.2*  HCT  22.5* 21.3* 24.5* 25.1* 25.4*  MCV 83.6 84.9 84.8 86.6 88.8  PLT 175 168 200 194 205    Recent Labs Lab 06/18/17 1623 06/18/17 1949 06/18/17 2351 06/19/17 0452 06/19/17 0759  GLUCAP 211* 181* 186* 164* 152*    Studies/Results: Ct Head Wo Contrast  Result Date: 06/18/2017 CLINICAL DATA:  76 y/o M; unexplained altered level of consciousness. EXAM: CT HEAD WITHOUT CONTRAST TECHNIQUE: Contiguous axial images were obtained from the base of the skull through the vertex without intravenous contrast. COMPARISON:  None. FINDINGS: Brain: Scattered small foci of hypoattenuation in periventricular white matter are nonspecific probably represent chronic microvascular ischemic changes. There is a lucency in the right cerebellar hemisphere which may represent an age indeterminate lacunar infarction. Additionally, there is hypoattenuation within the medial basal ganglia bilaterally which can be associated with anoxic or metabolic injury, also age indeterminate. No  large vascular territory acute infarction, intracranial hemorrhage, hydrocephalus, or extra-axial collection. Vascular: Mild calcific atherosclerosis of carotid siphons. No hyperdense vessel. Skull: Normal. Negative for fracture or focal lesion. Sinuses/Orbits: Mild left maxillary sinus mucosal thickening. Small right sphenoid sinus fluid level and trace mastoid effusions, probably due to intubation. Bilateral intra-ocular lens replacement. Other: None. IMPRESSION: 1. No acute large vascular territory infarction, intracranial hemorrhage, or focal mass effect. 2. Hypoattenuation bilateral medial basal ganglia can be associated with anoxic or metabolic injury, age indeterminate. 3. Right inferior cerebellar lucency compatible with age-indeterminate lacunar infarction. 4. Nonspecific foci of hypoattenuation in white matter compatible with microvascular ischemic changes. Mild brain parenchymal volume loss. Electronically Signed   By: Kristine Garbe  M.D.   On: 06/18/2017 13:44   Dg Chest Port 1 View  Result Date: 06/19/2017 CLINICAL DATA:  Respiratory failure. EXAM: PORTABLE CHEST 1 VIEW COMPARISON:  Radiograph of June 18, 2017. FINDINGS: Stable cardiomegaly. Status post coronary artery bypass graft. Endotracheal and nasogastric tubes are unchanged in position. Left internal jugular catheter is unchanged in position. Right subclavian catheter is unchanged in position. No pneumothorax or pleural effusion is noted. Stable bibasilar subsegmental atelectasis or edema is noted. Bony thorax is unremarkable. IMPRESSION: Stable support apparatus.  Stable bibasilar atelectasis or edema. Electronically Signed   By: Marijo Conception, M.D.   On: 06/19/2017 08:27   Dg Chest Port 1 View  Result Date: 06/18/2017 CLINICAL DATA:  Right subclavian central line placement. EXAM: PORTABLE CHEST 1 VIEW COMPARISON:  Earlier the same day FINDINGS: 0924 hours. Endotracheal tube tip is approximately 6.2 cm above the base of the carina. The NG tube passes into the stomach although the distal tip position is not included on the film. Left IJ central line remains in place with tip overlying the innominate vein confluence and directed laterally, potentially resting against the wall of the vein. Right subclavian central line is new in the interval with tip position near the SVC/ RA junction. Right IJ sheath remains in place. No evidence for pneumothorax. The cardiopericardial silhouette remains enlarged. Vascular congestion with diffuse interstitial opacity again noted suggesting edema. Probable basilar atelectasis bilaterally. Patient is status post median sternotomy. IMPRESSION: 1. New right subclavian central line tip overlies a position near the expected location of the SVC/RA junction. No evidence for pneumothorax. 2. Stable position of pre-existing tube/catheters. 3. Cardiomegaly with vascular congestion and diffuse interstitial opacities suggesting edema. 4. Basilar atelectasis.  Electronically Signed   By: Misty Stanley M.D.   On: 06/18/2017 09:42   Dg Chest Port 1 View  Result Date: 06/18/2017 CLINICAL DATA:  Check endotracheal tube placement EXAM: PORTABLE CHEST 1 VIEW COMPARISON:  07/03/2017 FINDINGS: Endotracheal tube and nasogastric catheter are noted in satisfactory position. Swan-Ganz catheter is been removed. Right jugular sheath remains. A left jugular dialysis catheter is again noted and stable. Left thoracostomy catheter and mediastinal drain have also been removed. No recurrent pneumothorax is seen. Bilateral parenchymal opacities are again noted consistent with pulmonary edema. Mild bibasilar atelectatic changes are noted as well. IMPRESSION: Tubes and lines as described. Stable changes of edema and bibasilar atelectasis. Electronically Signed   By: Inez Catalina M.D.   On: 06/18/2017 07:53   Medications: Infusions: . sodium chloride Stopped (06/12/17 1900)  . sodium chloride Stopped (06/10/17 0700)  . sodium chloride Stopped (06/18/17 1000)  . albumin human 12.5 g (06/15/17 0327)  . lactated ringers    . lactated ringers Stopped (07/02/2017 0400)  . milrinone 0.25 mcg/kg/min (06/19/17 0519)  .  norepinephrine (LEVOPHED) Adult infusion 12 mcg/min (06/19/17 0900)  . piperacillin-tazobactam (ZOSYN)  IV Stopped (06/19/17 0901)  . dialysis replacement fluid (prismasate) 300 mL/hr at 06/19/17 0921  . dialysis replacement fluid (prismasate) 300 mL/hr at 06/19/17 0924  . dialysate (PRISMASATE) 1,500 mL/hr at 06/19/17 0702  . sodium chloride    . sodium phosphate  Dextrose 5% IVPB 20 mmol (06/19/17 0602)  . vancomycin      Scheduled Medications: . amiodarone  200 mg Oral Daily  . aspirin EC  325 mg Oral Daily   Or  . aspirin  324 mg Per Tube Daily  . atorvastatin  80 mg Oral q1800  . bisacodyl  10 mg Oral Daily   Or  . bisacodyl  10 mg Rectal Daily  . chlorhexidine gluconate (MEDLINE KIT)  15 mL Mouth Rinse BID  . Chlorhexidine Gluconate Cloth  6 each  Topical q morning - 10a  . darbepoetin (ARANESP) injection - DIALYSIS  100 mcg Intravenous Q Mon-HD  . docusate  200 mg Oral Daily  . enoxaparin (LOVENOX) injection  40 mg Subcutaneous Daily  . feeding supplement (NEPRO CARB STEADY)  1,000 mL Per Tube Q24H  . feeding supplement (PRO-STAT SUGAR FREE 64)  60 mL Per Tube QID  . insulin aspart  0-24 Units Subcutaneous Q4H  . insulin glargine  20 Units Subcutaneous BID  . levalbuterol  0.63 mg Nebulization Q6H  . mouth rinse  15 mL Mouth Rinse 10 times per day  . metoCLOPramide (REGLAN) injection  10 mg Intravenous Q6H  . metoprolol tartrate  12.5 mg Oral BID   Or  . metoprolol tartrate  12.5 mg Per Tube BID  . pantoprazole sodium  40 mg Per Tube QHS  . sodium chloride flush  10-40 mL Intracatheter Q12H  . sodium chloride flush  3 mL Intravenous Q12H   Background 76 y.o. male with past medical history hypertension, diabetes mellitus, OSA, chronic low back pain. Presented 7/25 with SOB/HF symptoms,  troponin was elevated, creatinine 1.4. Cath done 7/27 (w/creatinine 1.96 and after that creatinine improved to 1.1-1.2) ->3V ds. CABG 7/29 with progressive rise in creatinine post op, development of oliguria, failed extubation, marked volume overload. Renal was asked to see. Failed lasix challenge, CRRT initiated 8/2.   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  BL creatinine around 1.4 Urinalysis bland.  Given the scenario likely ATN from hemodynamic cause.  Minimal UOP at this time.   Patient started CRRT on 8/2.   Still volume overloaded, will try and remove more fluid as patient's blood pressure allows.  Currently pulling about 150cc/hr.  So far weight down from 130kg->112.1 kg with adm weight noted to be 104.3 kg  Plan -continue CRRT  Blood pressure Patient is tolerating CRRT well, will continue as patient is still volume overloaded.   Patient still requiring pressors.   Still with  edema.   Weight down 18kg since start of CRRT. - continue  CRRT -weaning pressors as tolerated  Electrolytes/phosphotemia   Potassium, phos and sodium okay  Plan -since requiring frequent replacement, will continue daily 20 mm phos as sodium phosphate  Normocytic Anemia   Situational secondary to surgery- supportive care and transfuse as needed, Transfused 1 unit of PRBC on 8/5.   Today hemoglobin 8.2 and stable.   Iron -13, TIBC -154, saturation ratio 8.  Patient started on Aranesp 100 mcg.  Will need IV Fe for tsat 8% once off ATB's  Plan -aranesp 116mg weekly     Acute  respiratory failure Required re intubation on 8/3.  Pulm edema + pos HCAP.  [place]az through 8/7.  Resp cx 8/5 with no growth to date.   MSSA nasal swab positive.  PCCM started vanc/zosyn 8/7  S/p 3V CABG 05/20/2017 - per Dr. Darcey Nora  Acute encephalopathy EEG and Head CT 8/7.   EEG was abnormal demonstrating a moderate diffuse slowing of electrocerebral activity.   CT head showed no acute large vascular territory infarction, intracranial hemorrhage, or focal mass effect. Hypoattenuation bilateral medial basal ganglia can be associated with anoxic or metabolic injury   Boyd Kerbs  PGY-2, Internal medicine Pager: [phone] 06/19/2017,8:53 AM  LOS: 14 days    I have seen and examined this patient and agree with the excellent note above by Dr. Heber [place]. Renal wise still has a ot of edema despite 18 kg off. Continue same CRRT, all 4K fluids, no heparin, daily phos repletion. No renal recovery at this time.  Still not really responsive, off sedation. CT and EEG results noted.    Angelito Hopping B,MD 06/19/2017 10:18 AM

## 2017-06-19 NOTE — Progress Notes (Signed)
10 Days Post-Op Procedure(s) (LRB): CORONARY ARTERY BYPASS GRAFTING (CABG) x3 WITH TEE USING LEFT INTERNAL MAMMARY ARTERY AND RIGHT  SAPHENOUS LEG VEIN HARVESTED ENDOSCOPICALLY (N/A) Subjective: Opens eyes, moves all extremities Off iv fentanyl after several days of sedation on vent CXR clear nsr  WBC 17 k- cultures done and pending Hb stable 8.1 Objective: Vital signs in last 24 hours: Temp:  [96.9 F (36.1 C)-97.8 F (36.6 C)] 97.1 F (36.2 C) (08/08 0801) Pulse Rate:  [35-80] 75 (08/08 0732) Cardiac Rhythm: Normal sinus rhythm (08/08 0800) Resp:  [14-28] 16 (08/08 0732) BP: (103-171)/(37-81) 167/48 (08/08 0732) SpO2:  [95 %-100 %] 100 % (08/08 0732) FiO2 (%):  [40 %] 40 % (08/08 0732) Weight:  [247 lb 2.2 oz (112.1 kg)] 247 lb 2.2 oz (112.1 kg) (08/08 0600)  Hemodynamic parameters for last 24 hours: CVP:  [12 mmHg-13 mmHg] 12 mmHg  Intake/Output from previous day: 08/07 0701 - 08/08 0700 In: 2969 [I.V.:1153.3; NG/GT:572.7; IV Piggyback:1003] Out: 6964 [Urine:27] Intake/Output this shift: Total I/O In: 79.1 [I.V.:49.1; NG/GT:30] Out: 240 [Other:240]       Exam    General- alert and comfortable   Lungs- clear without rales, wheezes   Cor- regular rate and rhythm, no murmur , gallop   Abdomen- soft, non-tender   Extremities - warm, non-tender, minimal edema   Neuro-  no focal weakness   Lab Results:  Recent Labs  06/18/17 0405 06/19/17 0419  WBC 18.9* 17.5*  HGB 8.2* 8.2*  HCT 25.1* 25.4*  PLT 194 205   BMET:  Recent Labs  06/18/17 1410 06/19/17 0419  NA 137 137  138  K 4.0 3.8  3.9  CL 102 100*  101  CO2 27 26  28   GLUCOSE 192* 170*  170*  BUN 17 23*  24*  CREATININE 1.79* 1.67*  1.64*  CALCIUM 7.8* 8.0*  8.1*    PT/INR: No results for input(s): LABPROT, INR in the last 72 hours. ABG    Component Value Date/Time   PHART 7.393 06/29/2017 0333   HCO3 25.3 07/09/2017 0333   TCO2 27 07/06/2017 0333   ACIDBASEDEF 1.0 06/16/2017 0348   O2SAT 57.5 06/19/2017 0450   CBG (last 3)   Recent Labs  06/18/17 2351 06/19/17 0452 06/19/17 0759  GLUCAP 186* 164* 152*    Assessment/Plan: S/P Procedure(s) (LRB): CORONARY ARTERY BYPASS GRAFTING (CABG) x3 WITH TEE USING LEFT INTERNAL MAMMARY ARTERY AND RIGHT  SAPHENOUS LEG VEIN HARVESTED ENDOSCOPICALLY (N/A) PSvent wean- not ready for extubation TF nutrition Oral amio dosing Cont CVVH  LOS: 14 days    Kathlee Nationseter Van Trigt III 06/19/2017

## 2017-06-19 NOTE — Progress Notes (Signed)
Pharmacy Antibiotic Note  Philip Love is a 76 y.o. male admitted on 05/30/2017 with respiratory failure requiring mechanical ventillation. Pt was treated with 7 days of Fortaz, but WBC has increased slightly. Pharmacy has been consulted for vancomycin and Zosyn dosing. Patient is currently on CRRT since 8/2 for volume control.  Vancomycin level ordered by provider, drawn on 8/8 before second dose of vancomycin (drawn ~1 hr early). Vanc trough is therapeutic at 16 mcg/ml (goal 15-20 mcg/ml), after 1 dose. Patient is obese, so accumulation is a concern.   Plan: - Decrease Vancomycin to 750 mg IV Q24h - Continue Zosyn 3.375g IV q6h - Obtain vancomycin level on 8/10 due to concern of accumulation, then obtain levels thereafter as indicated - Monitor CRRT daily, S/Sx infection, LOT, cultures  Height: 5\' 8"  (172.7 cm) Weight: 247 lb 2.2 oz (112.1 kg) IBW/kg (Calculated) : 68.4  Temp (24hrs), Avg:97.3 F (36.3 C), Min:96.8 F (36 C), Max:97.8 F (36.6 C)   Recent Labs Lab 06/12/17 1832  06/15/17 0317  06/16/17 0350  12/19/2016 0317 12/19/2016 1632 06/18/17 0405 06/18/17 1410 06/19/17 0419 06/19/17 0935  WBC  --   < > 16.3*  --  17.9*  --  17.8*  --  18.9*  --  17.5*  --   CREATININE  --   < > 2.46*  2.45*  < > 2.17*  < > 2.08* 1.82* 1.80* 1.79* 1.67*  1.64*  --   LATICACIDVEN 1.4  --   --   --   --   --   --   --   --   --   --   --   VANCOTROUGH  --   --   --   --   --   --   --   --   --   --   --  16  < > = values in this interval not displayed.  Estimated Creatinine Clearance: 48.7 mL/min (A) (by C-G formula based on SCr of 1.64 mg/dL (H)).    Allergies  Allergen Reactions  . Bee Venom Swelling    Antimicrobials this admission: 7/31 Ceftazidime >> 8/6 8/7 Vancomycin >>  8/7 Zosyn >>  Dose adjustments this admission: n/a  Microbiology results: 7/27 surgical screen - MSSA 7/31 Trach aspirate - rare GNR 8/3 Trach aspirate - normal flora 8/5 Trach aspirate - NGTD  x2  Thank you for allowing pharmacy to be a part of this patient's care.  Adline PotterSabrina Kelwin Gibler, PharmD Pharmacy Resident Pager: [phone] 06/19/2017 12:44 PM

## 2017-06-20 ENCOUNTER — Inpatient Hospital Stay (HOSPITAL_COMMUNITY): Payer: Medicare Other

## 2017-06-20 DIAGNOSIS — I350 Nonrheumatic aortic (valve) stenosis: Secondary | ICD-10-CM

## 2017-06-20 LAB — RENAL FUNCTION PANEL
ANION GAP: 12 (ref 5–15)
Albumin: 3.9 g/dL (ref 3.5–5.0)
Albumin: 4.2 g/dL (ref 3.5–5.0)
Anion gap: 11 (ref 5–15)
BUN: 33 mg/dL — ABNORMAL HIGH (ref 6–20)
BUN: 32 mg/dL — AB (ref 6–20)
CALCIUM: 8.7 mg/dL — AB (ref 8.9–10.3)
CHLORIDE: 99 mmol/L — AB (ref 101–111)
CO2: 26 mmol/L (ref 22–32)
CO2: 23 mmol/L (ref 22–32)
CREATININE: 1.87 mg/dL — AB (ref 0.61–1.24)
Calcium: 8.5 mg/dL — ABNORMAL LOW (ref 8.9–10.3)
Chloride: 100 mmol/L — ABNORMAL LOW (ref 101–111)
Creatinine, Ser: 1.84 mg/dL — ABNORMAL HIGH (ref 0.61–1.24)
GFR calc Af Amer: 39 mL/min — ABNORMAL LOW (ref >60)
GFR calc non Af Amer: 35 mL/min — ABNORMAL LOW (ref >60)
GFR calc non Af Amer: 34 mL/min — ABNORMAL LOW (ref >60)
GFR, EST AFRICAN AMERICAN: 40 mL/min — AB (ref >60)
GLUCOSE: 165 mg/dL — AB (ref 65–99)
Glucose, Bld: 212 mg/dL — ABNORMAL HIGH (ref 65–99)
PHOSPHORUS: 2.4 mg/dL — AB (ref 2.5–4.6)
POTASSIUM: 3.8 mmol/L (ref 3.5–5.1)
Phosphorus: 1.9 mg/dL — ABNORMAL LOW (ref 2.5–4.6)
Potassium: 3.7 mmol/L (ref 3.5–5.1)
Sodium: 138 mmol/L (ref 135–145)
Sodium: 133 mmol/L — ABNORMAL LOW (ref 135–145)

## 2017-06-20 LAB — CULTURE, RESPIRATORY W GRAM STAIN: Culture: NO GROWTH

## 2017-06-20 LAB — COOXEMETRY PANEL
Carboxyhemoglobin: 1.4 % (ref 0.5–1.5)
Methemoglobin: 0.7 % (ref 0.0–1.5)
O2 Saturation: 54.3 %
Total hemoglobin: 8.4 g/dL — ABNORMAL LOW (ref 12.0–16.0)

## 2017-06-20 LAB — CBC
HCT: 26.1 % — ABNORMAL LOW (ref 39.0–52.0)
Hemoglobin: 8.3 g/dL — ABNORMAL LOW (ref 13.0–17.0)
MCH: 28.6 pg (ref 26.0–34.0)
MCHC: 31.8 g/dL (ref 30.0–36.0)
MCV: 90 fL (ref 78.0–100.0)
Platelets: 234 10*3/uL (ref 150–400)
RBC: 2.9 MIL/uL — ABNORMAL LOW (ref 4.22–5.81)
RDW: 17 % — ABNORMAL HIGH (ref 11.5–15.5)
WBC: 24.4 10*3/uL — ABNORMAL HIGH (ref 4.0–10.5)

## 2017-06-20 LAB — MAGNESIUM: MAGNESIUM: 2.6 mg/dL — AB (ref 1.7–2.4)

## 2017-06-20 LAB — GLUCOSE, CAPILLARY
GLUCOSE-CAPILLARY: 158 mg/dL — AB (ref 65–99)
GLUCOSE-CAPILLARY: 231 mg/dL — AB (ref 65–99)
GLUCOSE-CAPILLARY: 261 mg/dL — AB (ref 65–99)
GLUCOSE-CAPILLARY: 315 mg/dL — AB (ref 65–99)
Glucose-Capillary: 151 mg/dL — ABNORMAL HIGH (ref 65–99)
Glucose-Capillary: 141 mg/dL — ABNORMAL HIGH (ref 65–99)

## 2017-06-20 LAB — ECHOCARDIOGRAM COMPLETE
Height: 68 in
Weight: 3728.42 oz

## 2017-06-20 LAB — TRIGLYCERIDES: Triglycerides: 138 mg/dL (ref <150)

## 2017-06-20 MED ORDER — NEPRO/CARBSTEADY PO LIQD
1000.0000 mL | ORAL | Status: DC
  Administered 2017-06-21 – 2017-06-23 (×3): 1000 mL
  Filled 2017-06-20 (×6): qty 1000

## 2017-06-20 MED ORDER — POTASSIUM & SODIUM PHOSPHATES 280-160-250 MG PO PACK: 2.0000 | PACK | Freq: Every day | ORAL | Status: DC

## 2017-06-20 MED ORDER — POTASSIUM & SODIUM PHOSPHATES 280-160-250 MG PO PACK
2.0000 | PACK | Freq: Every day | ORAL | Status: DC
  Administered 2017-06-21: 2
  Filled 2017-06-20: qty 2

## 2017-06-20 MED ORDER — INSULIN GLARGINE 100 UNIT/ML ~~LOC~~ SOLN
30.0000 [IU] | Freq: Two times a day (BID) | SUBCUTANEOUS | Status: DC
  Administered 2017-06-20 – 2017-06-21 (×3): 30 [IU] via SUBCUTANEOUS
  Filled 2017-06-20 (×4): qty 0.3

## 2017-06-20 MED ORDER — PROPOFOL 1000 MG/100ML IV EMUL: 5.0000 ug/kg/min | INTRAVENOUS | Status: DC

## 2017-06-20 NOTE — Progress Notes (Signed)
[facility] Rounding Note  Subjective:    Patient is intubated on the vent.  Unable to follow commands, will retract with pain.  CRRT continues to run well.  Weight is down from a max of 130.1 kg to 105.7 kg today (adm weight 104 kg)  Objective Vital signs in last 24 hours: Vitals:   06/20/17 0600 06/20/17 0720 06/20/17 0757 06/20/17 0800  BP: (!) 106/45  (!) 112/44 (!) 101/51  Pulse: 96  (!) 103 (!) 108  Resp: (!) 27  20 (!) 22  Temp:   (!) 96.6 F (35.9 C) (!) 97.2 F (36.2 C)  TempSrc:   Axillary Core (Comment)  SpO2: 100% 100% 100% 100%  Weight: 233 lb 0.4 oz (105.7 kg)     Height:       Weight change: -14 lb 1.8 oz (-6.4 kg)  Intake/Output Summary (Last 24 hours) at 06/20/17 0951 Last data filed at 06/20/17 0800  Gross per 24 hour  Intake             1693 ml  Output             5930 ml  Net            -4237 ml   Physical Exam: CVP 25 General: on vent Heart: RRR Sternotomy scar healing/clean and dry.   Lungs: rhonchi throughout Abdomen: obese Extremities: 1+ pitting edema in upper and lower extremities bilaterally. L great toe with some purplish discoloration/sl mottling Access- left IJ vascath placed 8/2 New R [place] line (8/7)   Recent Labs Lab 06/19/17 0419 06/19/17 1703 06/20/17 0359  NA 137  138 136 138  K 3.8  3.9 3.8 3.8  CL 100*  101 98* 100*  CO2 _0 GLUCOSE 170*  170* 210* 165*  BUN 23*  24* 31* 33*  CREATININE 1.67*  1.64* 1.76* 1.84*  CALCIUM 8.0*  8.1* 8.4* 8.5*  PHOS 2.8 2.9 2.4*    Recent Labs Lab 06/16/17 0350  06/18/17 0405  06/19/17 0419 06/19/17 1703 06/20/17 0359  AST 37  --  56*  --  50*  --   --   ALT 17  --  19  --  22  --   --   ALKPHOS 147*  --  325*  --  299*  --   --   BILITOT 1.3*  --  1.1  --  1.1  --   --   PROT 5.2*  --  6.1*  --  6.2*  --   --   ALBUMIN 2.9*  < > 3.2*  < > 3.4*  3.4* 3.8 3.9  < > = values in this interval not displayed.   Recent Labs Lab 06/16/17 0350 07/09/2017 0317 06/18/17 0405  06/19/17 0419 06/20/17 0359  WBC 17.9* 17.8* 18.9* 17.5* 24.4*  HGB 7.3* 8.3* 8.2* 8.2* 8.3*  HCT 21.3* 24.5* 25.1* 25.4* 26.1*  MCV 84.9 84.8 86.6 88.8 90.0  PLT 168 200 194 205 234    Recent Labs Lab 06/19/17 1615 06/19/17 1933 06/19/17 2334 06/20/17 0426 06/20/17 0813  GLUCAP 203* 146* 176* 151* 141*    Studies/Results: Ct Head Wo Contrast  Result Date: 06/18/2017 CLINICAL DATA:  76 y/o M; unexplained altered level of consciousness. EXAM: CT HEAD WITHOUT CONTRAST TECHNIQUE: Contiguous axial images were obtained from the base of the skull through the vertex without intravenous contrast. COMPARISON:  None. FINDINGS: Brain: Scattered small foci of hypoattenuation in periventricular white matter are nonspecific  probably represent chronic microvascular ischemic changes. There is a lucency in the right cerebellar hemisphere which may represent an age indeterminate lacunar infarction. Additionally, there is hypoattenuation within the medial basal ganglia bilaterally which can be associated with anoxic or metabolic injury, also age indeterminate. No large vascular territory acute infarction, intracranial hemorrhage, hydrocephalus, or extra-axial collection. Vascular: Mild calcific atherosclerosis of carotid siphons. No hyperdense vessel. Skull: Normal. Negative for fracture or focal lesion. Sinuses/Orbits: Mild left maxillary sinus mucosal thickening. Small right sphenoid sinus fluid level and trace mastoid effusions, probably due to intubation. Bilateral intra-ocular lens replacement. Other: None. IMPRESSION: 1. No acute large vascular territory infarction, intracranial hemorrhage, or focal mass effect. 2. Hypoattenuation bilateral medial basal ganglia can be associated with anoxic or metabolic injury, age indeterminate. 3. Right inferior cerebellar lucency compatible with age-indeterminate lacunar infarction. 4. Nonspecific foci of hypoattenuation in white matter compatible with microvascular  ischemic changes. Mild brain parenchymal volume loss. Electronically Signed   By: Kristine Garbe M.D.   On: 06/18/2017 13:44   Dg Chest Port 1 View  Result Date: 06/20/2017 CLINICAL DATA:  Respiratory failure EXAM: PORTABLE CHEST 1 VIEW COMPARISON:  06/19/2017 FINDINGS: Cardiac shadow is mildly enlarged but stable. Postsurgical changes are again seen. Endotracheal tube and nasogastric catheter and right subclavian central line are again seen and stable. Bibasilar atelectatic changes are noted worse on the right than the left. Elevation right hemidiaphragm is noted. IMPRESSION: Bibasilar atelectasis stable from the previous exam. No new focal abnormality is noted. Electronically Signed   By: Inez Catalina M.D.   On: 06/20/2017 08:08   Dg Chest Port 1 View  Result Date: 06/19/2017 CLINICAL DATA:  Respiratory failure. EXAM: PORTABLE CHEST 1 VIEW COMPARISON:  Radiograph of June 18, 2017. FINDINGS: Stable cardiomegaly. Status post coronary artery bypass graft. Endotracheal and nasogastric tubes are unchanged in position. Left internal jugular catheter is unchanged in position. Right subclavian catheter is unchanged in position. No pneumothorax or pleural effusion is noted. Stable bibasilar subsegmental atelectasis or edema is noted. Bony thorax is unremarkable. IMPRESSION: Stable support apparatus.  Stable bibasilar atelectasis or edema. Electronically Signed   By: Marijo Conception, M.D.   On: 06/19/2017 08:27   Medications: Infusions: . sodium chloride Stopped (06/12/17 1900)  . sodium chloride Stopped (06/10/17 0700)  . sodium chloride Stopped (06/18/17 1000)  . albumin human 12.5 g (06/15/17 0327)  . lactated ringers    . lactated ringers Stopped (06/28/2017 0400)  . milrinone 0.25 mcg/kg/min (06/20/17 0525)  . norepinephrine (LEVOPHED) Adult infusion 15 mcg/min (06/20/17 0312)  . piperacillin-tazobactam (ZOSYN)  IV 3.375 g (06/20/17 0500)  . dialysis replacement fluid (prismasate) 300 mL/hr  at 06/20/17 0306  . dialysis replacement fluid (prismasate) 300 mL/hr at 06/20/17 0819  . dialysate (PRISMASATE) 1,500 mL/hr at 06/20/17 0115  . propofol (DIPRIVAN) infusion    . sodium chloride    . sodium phosphate  Dextrose 5% IVPB 20 mmol (06/20/17 0607)  . vancomycin      Scheduled Medications: . amiodarone  200 mg Per Tube Daily  . aspirin EC  325 mg Oral Daily   Or  . aspirin  324 mg Per Tube Daily  . atorvastatin  80 mg Per Tube q1800  . bisacodyl  10 mg Oral Daily   Or  . bisacodyl  10 mg Rectal Daily  . chlorhexidine gluconate (MEDLINE KIT)  15 mL Mouth Rinse BID  . Chlorhexidine Gluconate Cloth  6 each Topical q morning - 10a  . darbepoetin (  ARANESP) injection - DIALYSIS  100 mcg Intravenous Q Mon-HD  . docusate  200 mg Oral Daily  . enoxaparin (LOVENOX) injection  40 mg Subcutaneous Daily  . feeding supplement (NEPRO CARB STEADY)  1,000 mL Per Tube Q24H  . feeding supplement (PRO-STAT SUGAR FREE 64)  60 mL Per Tube QID  . insulin aspart  0-24 Units Subcutaneous Q4H  . insulin glargine  20 Units Subcutaneous BID  . levalbuterol  0.63 mg Nebulization Q6H  . mouth rinse  15 mL Mouth Rinse 10 times per day  . metoprolol tartrate  12.5 mg Per Tube BID   Or  . metoprolol tartrate  12.5 mg Per Tube BID  . pantoprazole sodium  40 mg Per Tube QHS  . sodium chloride flush  10-40 mL Intracatheter Q12H  . sodium chloride flush  3 mL Intravenous Q12H   Background 76 y.o. male with past medical history hypertension, diabetes mellitus, OSA, chronic low back pain. Presented 7/25 with SOB/HF symptoms,  troponin was elevated, creatinine 1.4. Cath done 7/27 (w/creatinine 1.96 and after that creatinine improved to 1.1-1.2) ->3V ds. CABG 7/29 with progressive rise in creatinine post op, development of oliguria, failed extubation, marked volume overload. Renal was asked to see. Failed lasix challenge, CRRT initiated 8/2.   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  BL creatinine around 1.4  Urinalysis bland.  Given the scenario likely ATN from hemodynamic cause.  Minimal UOP at this time.   Patient started CRRT on 8/2.   Still volume overloaded, will try and remove more fluid as patient's blood pressure allows.  Currently pulling about 150cc/hr.  So far weight down from 130kg->105.1 kg with adm weight noted to be 104.3 kg, so now very close to baseline weight but still quite a bit of edema on exam  Plan -continue CRRT   Blood pressure Patient is tolerating CRRT well, will continue as patient is still volume overloaded.   Patient still requiring pressors.   Still with  edema.   Weight down 25kg since start of CRRT. - continue CRRT -weaning pressors as tolerated  Electrolytes/phosphotemia   Potassium, phos (2.4) and sodium okay  Plan - daily 20 mm phos as sodium phosphate  Normocytic Anemia   Situational secondary to surgery- supportive care and transfuse as needed, Transfused 1 unit of PRBC on 8/5.   Today hemoglobin 8.3 and stable.   Iron -13, TIBC -154, saturation ratio 8.  Patient started on Aranesp 100 mcg.  Will need IV Fe for tsat 8% once off ATB's  Plan -aranesp 172mg weekly     Acute respiratory failure Required re intubation on 8/3.  Pulm edema + pos HCAP.  Fortaz through 8/7.  Resp cx 8/5 with no growth to date.   MSSA nasal swab positive.  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 VDarcey Nora Acute encephalopathy Patient is unresponsive to commands, and while he is posturing he does have purposeful movements. EEG 8/7 demonstrating moderate diffuse slowing of electrocerebral activity. And Head CT 8/7  showed no acute large vascular territory infarction, intracranial hemorrhage, or focal mass effect. Hypoattenuation bilateral medial basal ganglia can be associated with anoxic or metabolic injury.   Neurology has been consulted and recommended MRI when safe from CT surgery standpoint.   JBoyd Kerbs PGY-2, Internal  medicine Pager: [phone]/07/2017,9:51 AM  LOS: 15 days    Agree with above excellent note and assessment by Dr. HHeber Greenevers Persistent anuric AKI. Tolerating CRRT very  well. Continue same fluid removal. Neuro now involved d/t posturing movements. Concern about anoxic brain injury.  Jamal Maes, MD [facility] [phone] Pager 06/20/2017, 10:48 AM

## 2017-06-20 NOTE — Consult Note (Signed)
Neurology Consultation Reason for Consult: Abnormal neurological exam Referring Physician: Zenaida Niece tright, P  CC: Abnormal neurological exam  History is obtained from: Chart review  HPI: Philip Love is a 76 y.o. male with a history of multiple medical problems including diabetes who presented with 2 weeks progressive shortness of breath. He underwent CABG on 7/29 and was recovering and extubated on 7/30. He did have an episode on 7/31 with long pauses and altered mental status after being bolused with amiodarone. He was intubated emergently at that time. He was sedated following this, but sedation was turned off on 8/6 and he has not significantly improved. Overnight, it was noted that he was posturing bilaterally and therefore neurology has been consulted.   LKW: Unclear tpa given?: no, recent surgery   ROS:  Unable to obtain due to altered mental status.   Past Medical History:  Diagnosis Date  . Arthritis    "generalized; elbows; knees" (05/31/2017)  . CHF (congestive heart failure) (HCC) 06/06/2017  . Chronic lower back pain   . Essential hypertension   . Hyperlipidemia   . Obesity (BMI 30-39.9)   . Type 2 diabetes mellitus (HCC)      Family History  Problem Relation Age of Onset  . Heart disease Mother 15  . Heart attack Father 66  . Diabetes Father   . Heart attack Sister 20  . Congestive Heart Failure Sister      Social History:  reports that he quit smoking about 43 years ago. His smoking use included Cigarettes. He has a 3.00 pack-year smoking history. He has never used smokeless tobacco. He reports that he does not drink alcohol or use drugs.   Exam: Current vital signs: BP (!) 101/51   Pulse (!) 108   Temp (!) 97.2 F (36.2 C) (Core (Comment))   Resp (!) 22   Ht 5\' 8"  (1.727 m)   Wt 105.7 kg (233 lb 0.4 oz)   SpO2 100%   BMI 35.43 kg/m  Vital signs in last 24 hours: Temp:  [96.6 F (35.9 C)-98.8 F (37.1 C)] 97.2 F (36.2 C) (08/09 0800) Pulse  Rate:  [56-108] 108 (08/09 0800) Resp:  [14-27] 22 (08/09 0800) BP: (95-138)/(30-51) 101/51 (08/09 0800) SpO2:  [100 %] 100 % (08/09 0800) FiO2 (%):  [40 %] 40 % (08/09 0720) Weight:  [105.7 kg (233 lb 0.4 oz)] 105.7 kg (233 lb 0.4 oz) (08/09 0600)   Physical Exam  Constitutional: Appears Elderly Psych: Does not respond Eyes: No scleral injection HENT: Intubated Head: Normocephalic.  Cardiovascular: Normal rate and regular rhythm.  Respiratory: Ventilated GI: Soft.  No distension. There is no tenderness.  Skin: WDI  Neuro: Mental Status: Does not open eyes. Cranial Nerves: II: Does not blink to threat. Pupils are equal, round, and reactive to light.   III,IV, VI: EOMI without ptosis or diploplia.  V: VII: Corneals intact bilaterally X: Cough intact  Motor: To noxious stimulation he has extensor posturing in bilateral upper extremities, he withdraws bilateral lower extremities that he does clearly extensor posture, he also has some apparent voluntary cortical movements as well. Sensory: He does respond to noxious relation in all 4 extremities Deep Tendon Reflexes: Depressed throughout Plantars: Toes are downgoing bilaterally.  Cerebellar: Does not perform    I have reviewed labs in epic and the results pertinent to this consultation are: Elevated creatinine at 1.8  I have reviewed the images obtained: CT head - multiple findings which are age indeterminate,  Impression: 77 year old male  with a compromised neurological status now more than 7 days out from his most recent possible insult. Possibilities include embolic shower versus anoxic brain injury. I think that the latter is more likely given the symmetry of his findings. His exam is unusual, and though he is posturing, he has cortically driven movements as well. I'm not certain I can predict with any degree of certainty what his likelihood of recovery is, but he could have improvement to the point of interactiveness and  possibly even independent living, but I think that this would be a long-term recovery rather than any expectation of rapid improvement in short-term.  If patient were otherwise healthy and family wanted aggressive care with the understanding that he likely will not recover fully, then I think that long-term supportive care would be indicated.  An MRI of the brain would be helpful, though I'm not sure if this can be done this soon postoperatively.  Recommendations: 1) care to the extent that family wishes 2) agree with echocardiogram 3) neurology will follow 4) MRI brain if safe from CT surgery standpoint.   Ritta SlotMcNeill Mazal Ebey, MD [facility] [phone]  If 7pm- 7am, please page neurology on call as listed in AMION.

## 2017-06-20 NOTE — Progress Notes (Signed)
[facility]   Name: Philip Love    ADMISSION DATE:  05/25/2017 CONSULTATION DATE:  06/11/2017  REFERRING MD:  Philip Love  CHIEF COMPLAINT:  Respiratory Decompensation/ vent management  Brief Patient Summary:   76 year old obese former smoker with OSA , and insulin-dependent diabetes [place] He presented to the emergency department there with 2 weeks of progressive shortness of breath ankle swelling and chest discomfort. He had nonspecific EKG changes but was in sinus rhythm. Cardiac enzymes were mildly positive and chest x-ray showed mild CHF. He was transferred to this hospital. His creatinine was 2. Echocardiogram showed EF of 45%. No significant MR. Creatinine improved to 1.7 and he underwent coronary angiography and right heart cath. Coronary showed a 90% left main stenosis, proximal 80-90% LAD stenosis, 90% stenosis of the mid RCA and 80% stenosis of the proximal circumflex. LVEDP is 12. Right heart cath showed cardiac output of 5.5 L/m with normal right-sided pressures. The patient was admitted to the ICU placed on IV heparin and nitroglycerin. His creatinine post cath remains at 1.7.  Blood sugars are fairly well controlled.Patient underwent CABG x 3 with TEE using Left internal mammary artery and right saphenous leg vein on  August 13, 2017. He was recovering in the CVICU, was extubated 06/10/2017. He refused CPAP the night of 7/30-7/31> He was in atrial fibrillation and when bolused with Amio he had a rhythm change with long pauses and significant change in mental status requiring emergent re-intubation per Dr. Delton Love 7/31. ABG showed a metabolic acidosis.( 7.311/ 41.5/390/22/ 21.1)  CCM consulted for vent management and evaluation of respiratory decompensation.   SUBJECTIVE:   Only sedation since 8/6 was 25 mcg of fentanyl on 8/8 around 0715 Weaned PSV x 4 hrs 8/8 Per RN, patient bit his  tongue last night- with some bleeding, now has bite block Neuro consult for today per primary team CRRT  Continues w/NF ~150 ml/hr  VITAL SIGNS: BP (!) 106/45   Pulse 96   Temp 98.8 F (37.1 C)   Resp (!) 27   Ht 5\' 8"  (1.727 m)   Wt 233 lb 0.4 oz (105.7 kg)   SpO2 100%   BMI 35.43 kg/m   HEMODYNAMICS: CVP:  [10 mmHg-25 mmHg] 25 mmHg  VENTILATOR SETTINGS: Vent Mode: PRVC FiO2 (%):  [40 %] 40 % Set Rate:  [22 bmp] 22 bmp Vt Set:  [550 mL] 550 mL PEEP:  [5 cmH20] 5 cmH20 Pressure Support:  [5 cmH20] 5 cmH20 Plateau Pressure:  [20 cmH20-24 cmH20] 20 cmH20  INTAKE / OUTPUT: I/O last 3 completed shifts: In: 2908.4 [I.V.:1010.9; Other:440; NG/GT:770; IV Piggyback:687.5] Out: 8830 [Urine:30; Emesis/NG output:50; NWGNF:6213Other:8575; Stool:175]  PHYSICAL EXAMINATION:  General:  Obese male, NAD on MV HEENT: bite block in, unable to visualize tongue due to clenching, ETT/OGT, pupils 3/minimally reactive, sclerae anicteric Neuro: decerebrate posturing, +gag, +cough CV: IRIR, not paced PULM: even/ synchronous with MV, breathing over vent, lungs coarse, diminished in bases, thick secretions GI: obese, soft, hyper BS Extremities: cool/dry, trace edema BLE  Skin: no rash, BLE with dressing, weeping improved  LABS:  BMET  Recent Labs Lab 06/19/17 0419 06/19/17 1703 06/20/17 0359  NA 137  138 136 138  K 3.8  3.9 3.8 3.8  CL 100*  101 98* 100*  CO2 26  28 26 26   BUN 23*  24* 31* 33*  CREATININE 1.67*  1.64* 1.76* 1.84*  GLUCOSE 170*  170* 210* 165*    Electrolytes  Recent Labs Lab 06/18/17 0405  06/19/17 0419 06/19/17 1703 06/20/17 0359  CALCIUM 7.7*  < > 8.0*  8.1* 8.4* 8.5*  MG 2.7*  --  2.6*  --  2.6*  PHOS 2.3*  < > 2.8 2.9 2.4*  < > = values in this interval not displayed.  CBC  Recent Labs Lab 06/18/17 0405 06/19/17 0419 06/20/17 0359  WBC 18.9* 17.5* 24.4*  HGB 8.2* 8.2* 8.3*  HCT 25.1* 25.4* 26.1*  PLT 194 205 234    Coag's No results for  input(s): APTT, INR in the last 168 hours.  Sepsis Markers No results for input(s): LATICACIDVEN, PROCALCITON, O2SATVEN in the last 168 hours.  ABG  Recent Labs Lab 06/15/17 1609 06/16/17 0348 07/09/2017 0333  PHART 7.418 7.399 7.393  PCO2ART 36.7 39.1 41.2  PO2ART 87.0 93.0 98.0    Liver Enzymes  Recent Labs Lab 06/16/17 0350  06/18/17 0405  06/19/17 0419 06/19/17 1703 06/20/17 0359  AST 37  --  56*  --  50*  --   --   ALT 17  --  19  --  22  --   --   ALKPHOS 147*  --  325*  --  299*  --   --   BILITOT 1.3*  --  1.1  --  1.1  --   --   ALBUMIN 2.9*  < > 3.2*  < > 3.4*  3.4* 3.8 3.9  < > = values in this interval not displayed.  Cardiac Enzymes No results for input(s): TROPONINI, PROBNP in the last 168 hours.  Glucose  Recent Labs Lab 06/19/17 0452 06/19/17 0759 06/19/17 1129 06/19/17 1615 06/19/17 1933 06/19/17 2334  GLUCAP 164* 152* 205* 203* 146* 176*    Imaging No results found.  STUDIES:  Echo 06/06/2017>>EF 40-45%, Moderate LVH, Mildly to moderately  reduced systolic dysfunction, MV mild regurgitation, LA mildly dilated 7/27 LHC/RHC >>90% left main stenosis, proximal 80-90% LAD stenosis, 90% stenosis of the mid RCA and 80% stenosis of the proximal circumflex. LVEDP is 12, cardiac output of 5.5 L/m with normal right-sided pressures. EEG 8/7 > moderate diffuse slowing, no focal, hemispheric or lateralizing features, no epileptiform activity  CT head 8/7 > No acute large vascular territory infarction, intracranial hemorrhage, or focal mass effect; hypoattenuation bilateral medial basal ganglia can be associated/w anoxic or metabolic injury, age indeterminate; right inferior cerebellar lucency c/w age-indeterminate lacunar infarction; nonspecific foci of hypoattenuation in white matter compatible/w microvascular ischemic changes; mild brain parenchymal volume loss.  CULTURES: 7/27 MRSA PCR >> neg 7/27 surgical screen- urine >>MSSA postive Sputum 7/31>>  multiple organism present, none predominant Respiratory 8/3 >> normal Trach asp 8/5 >> ngtd [facility] 8/7 >> Trach asp 8/7 >> UC 8/7 >> neg Cdif 8/8 >> neg   ANTIBIOTICS: Ceftazidine 7/31>> 8/7 Zinacef 7/29 preop Zosyn 8/7 >> Vanc 8/7 >>  SIGNIFICANT EVENTS: 05/15/2017>> CABG 06/10/2017>> Extubation 06/11/2017>> Emergent re-intubation for mixed respiratory/metabolic acidosis 06/12/2017 > self extubated -> BiPAP 06/13/17 > CRRT  LINES/TUBES: R IJ Swan 7/29 >> 8/6 Left and Mediastinal CT>> 7/29 R Radial A Line 7/29 >>8/1 ETT 7/31 >> 8/1 Left femoral Aline 8/1 >8/6 L IJ HD cath 8/2 >> R SVC CVL 8/7 >>  DISCUSSION:  ASSESSMENT / PLAN:  PULMONARY A: Acute hypoxic respiratory failure Mixed Respiratory/Metabolic Acidosis  R/o HCAP/ LLL opacity vs pulmonary edema Suspected OSA/OHS  P:   Continue mechanical ventilation - 8 mL/kg Hold with further SBT/no  extubation given neuro status Wean FiO2 and PEEP as able for SpO2 > 92% Follow sputum cultures See ID Trend CXR   CARDIOVASCULAR A:  Cardiogenic shock Afib/ Bradycardia (on amiodarone) 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   P:   Lose dose propofol as needed to prevent clenching to protect ETT-  Neuro to see for evaluation this am   Fentanyl to PRN-  minimize sedation if at all possible RASS goal 0  Further imaging/testing per Neuro recs  FAMILY  - Updates: No family at bedside 8/9 am.  Family conference held yesterday afternoon with Dr. Donata Clay with plans for neurology consult this morning due to poor responsiveness with holding sedation for several days.  Family at that time stated they did not wish for prolonged aggressive therapy if patient would not have a reasonable chance of recovery.   - Inter-disciplinary family meet or Palliative Care meeting due - ongoing  CC time: 30 min  Posey Boyer, AGACNP-BC [facility] Pgr: [phone] or if no answer [phone] 06/20/2017, 7:29 AM

## 2017-06-20 NOTE — Plan of Care (Signed)
Problem: Cardiac: Goal: Ability to maintain an adequate cardiac output will improve Outcome: Progressing Monitoring Co-ox daily, remains on Mil gtt  Problem: Nutritional: Goal: Risk for body nutrition deficit will decrease Outcome: Progressing TF at goal, tolerating  Problem: Physical Regulation: Goal: Postoperative complications will be avoided or minimized Outcome: Not Progressing Increased FSBS addressed on evening rounds, pt remains with AMS

## 2017-06-20 NOTE — Progress Notes (Signed)
  Echocardiogram 2D Echocardiogram has been performed.  Philip Love 06/20/2017, 1:19 PM

## 2017-06-20 NOTE — Plan of Care (Signed)
Problem: Bowel/Gastric: Goal: Gastrointestinal status for postoperative course will improve Outcome: Not Progressing Pt having diarrhea  Problem: Respiratory: Goal: Levels of oxygenation will improve Outcome: Progressing Pt remains on vent but with minimal support Goal: Respiratory status will improve Outcome: Not Progressing Pt not at a place to protect airway without vent yet  Problem: Urinary Elimination: Goal: Ability to achieve and maintain adequate renal perfusion and functioning will improve Outcome: Progressing Pt remains on CVVHD at this time for volume overload  Problem: Role Relationship: Goal: Method of communication will improve Outcome: Not Progressing Pt does not interact or communicate at this time

## 2017-06-20 NOTE — Progress Notes (Addendum)
11 Days Post-Op Procedure(s) (LRB): CORONARY ARTERY BYPASS GRAFTING (CABG) x3 WITH TEE USING LEFT INTERNAL MAMMARY ARTERY AND RIGHT  SAPHENOUS LEG VEIN HARVESTED ENDOSCOPICALLY (N/A) Subjective: Continues to be supported with mechanical ventilation, off sedation except as needed propofol to prevent patient biting on ET tube Appreciate neurology assessment and recommendations. Patient is less than 2 weeks after CABG and it would not be safe for another week to perform an MRI Echocardiogram performed today shows that LV function no significant valvular abnormalities Patient maintaining sinus rhythm Meeting nutritional goal through Nepro tube feeds Cultures remain negative, central line was personally changed 3 days ago White count remains elevated at 24,000 Objective: Vital signs in last 24 hours: Temp:  [96.6 F (35.9 C)-98.8 F (37.1 C)] 97 F (36.1 C) (08/09 1700) Pulse Rate:  [56-108] 88 (08/09 1700) Cardiac Rhythm: Atrial fibrillation (08/09 1400) Resp:  [18-29] 26 (08/09 1700) BP: (90-123)/(30-74) 101/42 (08/09 1700) SpO2:  [100 %] 100 % (08/09 1700) FiO2 (%):  [40 %] 40 % (08/09 1614) Weight:  [233 lb 0.4 oz (105.7 kg)] 233 lb 0.4 oz (105.7 kg) (08/09 0600)  Hemodynamic parameters for last 24 hours: CVP:  [6 mmHg-25 mmHg] 6 mmHg  Intake/Output from previous day: 08/08 0701 - 08/09 0700 In: 2036.6 [I.V.:576.6; NG/GT:660; IV Piggyback:600] Out: 6292 [Urine:15; Emesis/NG output:50; Stool:175] Intake/Output this shift: Total I/O In: 746.6 [I.V.:231.6; NG/GT:290; IV Piggyback:225] Out: 2483 [Urine:10; ZOXWR:6045Other:2413; Stool:60]       Exam    General- eyes closed but appears  comfortable on ventilator   Lungs- clear without rales, wheezes   Cor- regular rate and rhythm, no murmur , gallop   Abdomen- soft, non-tender   Extremities - warm, non-tender, minimal edema   Neuro- posturing but also moves all extremities to pain   Lab Results:  Recent Labs  06/19/17 0419  06/20/17 0359  WBC 17.5* 24.4*  HGB 8.2* 8.3*  HCT 25.4* 26.1*  PLT 205 234   BMET:  Recent Labs  06/20/17 0359 06/20/17 1537  NA 138 133*  K 3.8 3.7  CL 100* 99*  CO2 26 23  GLUCOSE 165* 212*  BUN 33* 32*  CREATININE 1.84* 1.87*  CALCIUM 8.5* 8.7*    PT/INR: No results for input(s): LABPROT, INR in the last 72 hours. ABG    Component Value Date/Time   PHART 7.393 11-18-2016 0333   HCO3 25.3 11-18-2016 0333   TCO2 27 11-18-2016 0333   ACIDBASEDEF 1.0 06/16/2017 0348   O2SAT 54.3 06/20/2017 0410   CBG (last 3)   Recent Labs  06/20/17 0813 06/20/17 1214 06/20/17 1543  GLUCAP 141* 231* 315*    Assessment/Plan: S/P Procedure(s) (LRB): CORONARY ARTERY BYPASS GRAFTING (CABG) x3 WITH TEE USING LEFT INTERNAL MAMMARY ARTERY AND RIGHT  SAPHENOUS LEG VEIN HARVESTED ENDOSCOPICALLY (N/A) We'll continue to support patient and follow neuro status Patient has been intubated 6 days   LOS: 15 days    Kathlee Nationseter Van Trigt III 06/20/2017  I discussed patients condition and plan of care with family in ICU PVT

## 2017-06-20 NOTE — Progress Notes (Signed)
Chaplain stopped in to provide continual support for this family while they go through this medical process with their loved one.  Family members are discussing how he wouldn't want to live this way but that the doctor is helping them understand that this is healing right now, not just life saving.  Chaplain will continue to follow.    06/20/17 1400  Clinical Encounter Type  Visited With Patient and family together  Visit Type Follow-up;Psychological support;Spiritual support

## 2017-06-21 ENCOUNTER — Inpatient Hospital Stay (HOSPITAL_COMMUNITY): Payer: Medicare Other

## 2017-06-21 DIAGNOSIS — J9600 Acute respiratory failure, unspecified whether with hypoxia or hypercapnia: Secondary | ICD-10-CM

## 2017-06-21 LAB — RENAL FUNCTION PANEL
ALBUMIN: 4.5 g/dL (ref 3.5–5.0)
ANION GAP: 13 (ref 5–15)
Albumin: 4.4 g/dL (ref 3.5–5.0)
Anion gap: 12 (ref 5–15)
BUN: 39 mg/dL — ABNORMAL HIGH (ref 6–20)
BUN: 39 mg/dL — AB (ref 6–20)
CALCIUM: 8.8 mg/dL — AB (ref 8.9–10.3)
CO2: 25 mmol/L (ref 22–32)
CO2: 25 mmol/L (ref 22–32)
CREATININE: 1.88 mg/dL — AB (ref 0.61–1.24)
Calcium: 11.6 mg/dL — ABNORMAL HIGH (ref 8.9–10.3)
Chloride: 98 mmol/L — ABNORMAL LOW (ref 101–111)
Chloride: 101 mmol/L (ref 101–111)
Creatinine, Ser: 1.8 mg/dL — ABNORMAL HIGH (ref 0.61–1.24)
GFR calc non Af Amer: 36 mL/min — ABNORMAL LOW (ref >60)
GFR, EST AFRICAN AMERICAN: 41 mL/min — AB (ref >60)
GFR, EST AFRICAN AMERICAN: 39 mL/min — AB (ref >60)
GFR, EST NON AFRICAN AMERICAN: 34 mL/min — AB (ref >60)
Glucose, Bld: 224 mg/dL — ABNORMAL HIGH (ref 65–99)
Glucose, Bld: 172 mg/dL — ABNORMAL HIGH (ref 65–99)
PHOSPHORUS: 2 mg/dL — AB (ref 2.5–4.6)
PHOSPHORUS: 3.4 mg/dL (ref 2.5–4.6)
POTASSIUM: 3.6 mmol/L (ref 3.5–5.1)
POTASSIUM: 3.4 mmol/L — AB (ref 3.5–5.1)
SODIUM: 136 mmol/L (ref 135–145)
Sodium: 138 mmol/L (ref 135–145)

## 2017-06-21 LAB — GLUCOSE, CAPILLARY
GLUCOSE-CAPILLARY: 218 mg/dL — AB (ref 65–99)
Glucose-Capillary: 139 mg/dL — ABNORMAL HIGH (ref 65–99)
Glucose-Capillary: 167 mg/dL — ABNORMAL HIGH (ref 65–99)
Glucose-Capillary: 189 mg/dL — ABNORMAL HIGH (ref 65–99)
Glucose-Capillary: 194 mg/dL — ABNORMAL HIGH (ref 65–99)
Glucose-Capillary: 229 mg/dL — ABNORMAL HIGH (ref 65–99)

## 2017-06-21 LAB — COOXEMETRY PANEL
Carboxyhemoglobin: 1.5 % (ref 0.5–1.5)
Methemoglobin: 1.2 % (ref 0.0–1.5)
O2 Saturation: 47 %
Total hemoglobin: 8.6 g/dL — ABNORMAL LOW (ref 12.0–16.0)

## 2017-06-21 LAB — CBC
HCT: 26.8 % — ABNORMAL LOW (ref 39.0–52.0)
Hemoglobin: 8.5 g/dL — ABNORMAL LOW (ref 13.0–17.0)
MCH: 28.3 pg (ref 26.0–34.0)
MCHC: 31.7 g/dL (ref 30.0–36.0)
MCV: 89.3 fL (ref 78.0–100.0)
Platelets: 258 10*3/uL (ref 150–400)
RBC: 3 MIL/uL — ABNORMAL LOW (ref 4.22–5.81)
RDW: 16.8 % — ABNORMAL HIGH (ref 11.5–15.5)
WBC: 31.7 10*3/uL — ABNORMAL HIGH (ref 4.0–10.5)

## 2017-06-21 LAB — TSH: TSH: 5.899 u[IU]/mL — ABNORMAL HIGH (ref 0.350–4.500)

## 2017-06-21 LAB — AMMONIA: AMMONIA: 27 umol/L (ref 9–35)

## 2017-06-21 LAB — MAGNESIUM: Magnesium: 2.8 mg/dL — ABNORMAL HIGH (ref 1.7–2.4)

## 2017-06-21 MED ORDER — AMIODARONE IV BOLUS ONLY 150 MG/100ML
150.0000 mg | Freq: Once | INTRAVENOUS | Status: AC
  Administered 2017-06-21: 150 mg via INTRAVENOUS
  Filled 2017-06-21: qty 100

## 2017-06-21 MED ORDER — HEPARIN (PORCINE) IN NACL 100-0.45 UNIT/ML-% IJ SOLN
900.0000 [IU]/h | INTRAMUSCULAR | Status: DC
  Administered 2017-06-21: 800 [IU]/h via INTRAVENOUS
  Administered 2017-06-23 – 2017-06-24 (×2): 900 [IU]/h via INTRAVENOUS
  Filled 2017-06-21 (×3): qty 250

## 2017-06-21 MED ORDER — ALBUMIN HUMAN 5 % IV SOLN
12.5000 g | Freq: Once | INTRAVENOUS | Status: AC
  Administered 2017-06-21: 12.5 g via INTRAVENOUS
  Filled 2017-06-21: qty 250

## 2017-06-21 MED ORDER — SODIUM PHOSPHATES 45 MMOLE/15ML IV SOLN
30.0000 mmol | Freq: Every morning | INTRAVENOUS | Status: DC
  Administered 2017-06-21 – 2017-06-24 (×4): 30 mmol via INTRAVENOUS
  Filled 2017-06-21 (×5): qty 10

## 2017-06-21 MED ORDER — FLUCONAZOLE IN SODIUM CHLORIDE 100-0.9 MG/50ML-% IV SOLN
100.0000 mg | INTRAVENOUS | Status: AC
  Administered 2017-06-21 – 2017-06-23 (×3): 100 mg via INTRAVENOUS
  Filled 2017-06-21 (×3): qty 50

## 2017-06-21 MED ORDER — POTASSIUM CHLORIDE 10 MEQ/50ML IV SOLN
10.0000 meq | INTRAVENOUS | Status: AC
  Administered 2017-06-21 (×3): 10 meq via INTRAVENOUS
  Filled 2017-06-21 (×3): qty 50

## 2017-06-21 MED ORDER — ALBUMIN HUMAN 25 % IV SOLN
12.5000 g | Freq: Once | INTRAVENOUS | Status: AC
  Administered 2017-06-21: 12.5 g via INTRAVENOUS
  Filled 2017-06-21: qty 50

## 2017-06-21 NOTE — Progress Notes (Signed)
PULMONARY / CRITICAL CARE MEDICINE   Name: Philip Love MRN: [id number] DOB: [birth year]    ADMISSION DATE:  05/13/2017 CONSULTATION DATE:  06/11/2017  REFERRING MD:  Morton Peters  CHIEF COMPLAINT:  Respiratory Decompensation/ vent management  Brief Patient Summary:   76 year old obese former smoker with OSA , and insulin-dependent diabetes recently moved from [place] to [facility]. He presented to the ER with 2 weeks of progressive shortness of breath ankle swelling and chest discomfort. He had nonspecific EKG changes but was in sinus rhythm. Cardiac enzymes were mildly positive and chest x-ray showed mild CHF. He was transferred to [facility] [place]. His creatinine was 2. Echocardiogram showed EF of 45%. No significant MR. Creatinine improved to 1.7 and he underwent coronary angiography and right heart cath. Coronary showed a 90% left main stenosis, proximal 80-90% LAD stenosis, 90% stenosis of the mid RCA and 80% stenosis of the proximal circumflex. LVEDP is 12. Right heart cath showed cardiac output of 5.5 L/m with normal right-sided pressures. The patient was admitted to the ICU placed on IV heparin and nitroglycerin. His creatinine post cath was 1.7.  Patient underwent CABG x 3 with TEE using Left internal mammary artery and right saphenous leg vein on 05/23/2017. He was recovering in the CVICU, was extubated 06/10/2017. He refused CPAP the night of 7/30-7/31> He was in atrial fibrillation and when bolused with Amio he had a rhythm change with long pauses and significant change in mental status requiring emergent re-intubation per Dr. Delton Coombes 7/31. ABG showed a metabolic acidosis.( 7.311/ 41.5/390/22/ 21.1)  CCM consulted for vent management and evaluation of respiratory decompensation.   SUBJECTIVE:  RN reports pt remains on CVVHD (keeping even), no acute events overnight.  Not waking.  Remains on milrinone, levophed.  SvO2 53%.     VITAL SIGNS: BP (!) 91/52   Pulse (!) 120   Temp 97.6 F  (36.4 C) (Oral)   Resp (!) 23   Ht 5\' 8"  (1.727 m)   Wt 231 lb 14.8 oz (105.2 kg)   SpO2 100%   BMI 35.26 kg/m   HEMODYNAMICS: CVP:  [3 mmHg-9 mmHg] 6 mmHg  VENTILATOR SETTINGS: Vent Mode: PRVC FiO2 (%):  [40 %] 40 % Set Rate:  [22 bmp] 22 bmp Vt Set:  [550 mL] 550 mL PEEP:  [5 cmH20] 5 cmH20 Plateau Pressure:  [23 cmH20-26 cmH20] 25 cmH20  INTAKE / OUTPUT: I/O last 3 completed shifts: In: 3643.1 [I.V.:1020.1; Other:60; WU/JW:1191.4; IV Piggyback:945.5] Out: 7829 [Urine:60; FAOZH:0865; Stool:770]  PHYSICAL EXAMINATION:  General: chronically ill appearing male on vent HEENT: MM pink/moist, ETT, poor dentition  Neuro: eyes open, no follow commands, bites ETT with stimulation, no follow commands  CV: s1s2 rrr, no m/r/g  PULM: even/non-labored, lungs bilaterally clear  HQ:IONG, non-tender, bsx4 active  Extremities: warm/dry, no edema  Skin: no rashes or lesions.  R [place] [facility], L IJ HD cath c/d/i   LABS:  BMET  Recent Labs Lab 06/20/17 1537 06/21/17 0430 06/21/17 1633  NA 133* 136 138  K 3.7 3.6 3.4*  CL 99* 98* 101  CO2 23 25 25   BUN 32* 39* 39*  CREATININE 1.87* 1.80* 1.88*  GLUCOSE 212* 224* 172*    Electrolytes  Recent Labs Lab 06/19/17 0419  06/20/17 0359 06/20/17 1537 06/21/17 0430 06/21/17 1633  CALCIUM 8.0*  8.1*  < > 8.5* 8.7* 8.8* 11.6*  MG 2.6*  --  2.6*  --  2.8*  --   PHOS 2.8  < > 2.4*  1.9* 2.0* 3.4  < > = values in this interval not displayed.  CBC  Recent Labs Lab 06/19/17 0419 06/20/17 0359 06/21/17 0430  WBC 17.5* 24.4* 31.7*  HGB 8.2* 8.3* 8.5*  HCT 25.4* 26.1* 26.8*  PLT 205 234 258    Coag's No results for input(s): APTT, INR in the last 168 hours.  Sepsis Markers No results for input(s): LATICACIDVEN, PROCALCITON, O2SATVEN in the last 168 hours.  ABG  Recent Labs Lab 06/15/17 1609 06/16/17 0348 06/20/2017 0333  PHART 7.418 7.399 7.393  PCO2ART 36.7 39.1 41.2  PO2ART 87.0 93.0 98.0    Liver Enzymes  Recent  Labs Lab 06/16/17 0350  06/18/17 0405  06/19/17 0419  06/20/17 1537 06/21/17 0430 06/21/17 1633  AST 37  --  56*  --  50*  --   --   --   --   ALT 17  --  19  --  22  --   --   --   --   ALKPHOS 147*  --  325*  --  299*  --   --   --   --   BILITOT 1.3*  --  1.1  --  1.1  --   --   --   --   ALBUMIN 2.9*  < > 3.2*  < > 3.4*  3.4*  < > 4.2 4.4 4.5  < > = values in this interval not displayed.  Cardiac Enzymes No results for input(s): TROPONINI, PROBNP in the last 168 hours.  Glucose  Recent Labs Lab 06/20/17 2020 06/20/17 2351 06/21/17 0414 06/21/17 0814 06/21/17 1210 06/21/17 1612  GLUCAP 158* 261* 218* 194* 229* 167*    Imaging Ct Head Wo Contrast  Result Date: 06/21/2017 CLINICAL DATA:  Initial evaluation for decreased level of consciousness, subacute neuro deficit. EXAM: CT HEAD WITHOUT CONTRAST TECHNIQUE: Contiguous axial images were obtained from the base of the skull through the vertex without intravenous contrast. COMPARISON:  Prior CT from 06/18/2017. FINDINGS: Brain: Stable cerebral atrophy with chronic microvascular ischemic disease. There has been interval evolution of multiple patchy hypodensities involving the periventricular white matter of the bilateral corona radiata, right greater than left, now all slightly increased in size and more prominent as compared to previous exam (series 3, image 19). Patchy hypodensity involving the splenium also more prominent (series 3, image 18). Involvement of the periatrial white matter (series 3, image 16). Fairly symmetric hypodensity within the globus palladi (series 3, image 16). Involvement of the adjacent posterior limb of the left internal capsule. Hypodensity within the central right cerebellar hemisphere (series 3, image 7). Possible patchy involvement within the pons, somewhat age indeterminate. Superimposed small remote posterior left frontal infarct involving the precentral gyrus noted (series 3, image 24). Findings  favored to reflect evolving ischemia, possibly embolic in nature given the various vascular distributions. Superimposed component of anoxia it may also be present given the globus pallidus involvement. No acute intracranial hemorrhage. No other new acute large vessel territory infarct identified. No mass lesion or midline shift. No hydrocephalus. No extra-axial fluid collection. Vascular: No hyperdense vessel. Scattered vascular calcifications noted within the carotid siphons. Skull: Scalp soft tissues and calvarium within normal limits. Sinuses/Orbits: Globes and orbital soft tissues within normal limits. Patient status post lens extraction bilaterally. Air-fluid level present within the right sphenoid sinus. Paranasal sinuses are otherwise largely clear. Trace bilateral mastoid effusions. IMPRESSION: 1. Evolving patchy hypodensities involving the periventricular white matter, splenium, globus palladi, and right cerebellum, with additional  possible patchy involvement within the pons. Findings favored to reflect evolving ischemia, possibly embolic in nature. Superimposed component of anoxic injury may be present as well given the globus pallidus involvement. 2. Otherwise stable underlying atrophy with chronic microvascular ischemic disease. Electronically Signed   By: Rise Mu M.D.   On: 06/21/2017 13:52    STUDIES:  ECHO 7/26 >> EF 40-45%, Moderate LVH, Mildly to moderately  reduced systolic dysfunction, MV mild regurgitation, LA mildly dilated LHC/RHC 7/27 >> 90% left main stenosis, proximal 80-90% LAD stenosis, 90% stenosis of the mid RCA and 80% stenosis of the proximal circumflex. LVEDP is 12, cardiac output of 5.5 L/m with normal right-sided pressures. EEG 8/7 >> moderate diffuse slowing, no focal, hemispheric or lateralizing features, no epileptiform activity  CT head 8/7 >> No acute large vascular territory infarction, intracranial hemorrhage, or focal mass effect; hypoattenuation  bilateral medial basal ganglia can be associated/w anoxic or metabolic injury, age indeterminate; right inferior cerebellar lucency c/w age-indeterminate lacunar infarction; nonspecific foci of hypoattenuation in white matter compatible/w microvascular ischemic changes; mild brain parenchymal volume loss. ECHO 8/9 >> LVEF 606-65%, no wall motion abnormalities, mild AS, RV mildly dilated  CULTURES: 7/27 MRSA PCR >> neg 7/27 surgical screen- urine >> MSSA postive Sputum 7/31>> multiple organism present, none predominant Respiratory 8/3 >> normal Trach asp 8/5 >> negative  BC 8/7 >>  Trach asp 8/7 >> negative  UC 8/7 >> neg Cdif 8/8 >> neg  ANTIBIOTICS: Ceftazidine 7/31>> 8/7 Zinacef 7/29 preop Zosyn 8/7 >> Vanc 8/7 >> Diflucan 8/10 >>   SIGNIFICANT EVENTS: 7/29  CABG 7/30  Extubation 7/31  Emergent re-intubation for mixed respiratory/metabolic acidosis 8/01  Self extubated -> BiPAP 8/02  CRRT 8/11  No sedation in over a week, not waking  LINES/TUBES: R IJ Swan 7/29 >> 8/6 Left & Mediastinal CTc>> 7/29 R Radial A Line 7/29 >> 8/1 ETT 7/31 >> 8/1 Left femoral Aline 8/1 >> 8/6 L IJ HD cath 8/2 >> R SVC CVL 8/7 >>   DISCUSSION: 76 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  Post-op course complicated by prolonged vent needs, AKI on CVVHD & AF.  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 Frequent ectopy P: ICU monitoring of hemodynamics Post-op care per CVTS Continue levophed, milrinone   Trend Co-Ox  RENAL A:   Acute on Chronic Kidney Disease  - oliguric NAGMA - resolved Hypermagnesemia Hypophosphatemia P:   Trend BMP / urinary output Replace electrolytes as indicated Avoid nephrotoxic agents, ensure adequate renal perfusion CVVHD per Nephrology, goal even balance Appears to nearing euvolemic/dry  GASTROINTESTINAL A:   At Risk Protein Calorie Malnutrition  P:   PPI for SUP  Bowel regimen  TF per nutrition  HEMATOLOGIC A:   Anemia - stable P:  Trend CBC  Transfuse for Hgb <8%, 1 unit PRBC 8/11 SCD's for DVT prophylaxis   INFECTIOUS A:   ? HCAP Leukocytosis  P:   Follow cultures to maturity  Continue Vanco / Zosyn, D5/x  Monitor fever curve / WBC trend  ENDOCRINE A:   IDDM - HA1c 6.3 P:   SSI Increase Lantus to 32 units BID   NEUROLOGIC A:   Acute Encephalopathy - in setting of critical illness, AKI, sedation  Back pain - last sedation 8/8 - fentanyl 25 mcg given 8/8 around 0715 for biting, otherwise no sedation since 8/6 - now with decerebrate posturing, clenching requiring bite block after biting tongue last night.   P:   RASS Goal:  0  Neurology following > rec's for MRI when able, waiting one week for neuro reassessment Minimize sedation as able  PRN fentanyl for clenching / protect ETT  FAMILY  - Updates: No family at bedside 8/11 am.  Prior family conference held with Dr. Donata ClayVan Trigt, pending neuro exam, family does not wish for prolonged aggressive therapies if no reasonable chance of recovery.    - Inter-disciplinary family meet or Palliative Care meeting due - ongoing  CC time: 30 minutes   Canary BrimBrandi Jaice Digioia, NP-C [facility] Pgr: [phone] or if no answer [phone] 06/21/2017, 9:02 PM

## 2017-06-21 NOTE — Significant Event (Signed)
Epicardial wires removed at 1618. No tissues or blood noted at end tips of EPWs. No bleeding at site.     Philip Love

## 2017-06-21 NOTE — Progress Notes (Signed)
Subjective: No changes overnight.   Exam: Vitals:   06/21/17 0804 06/21/17 0808  BP: (!) 88/44   Pulse: (!) 118   Resp: (!) 26   Temp:    SpO2: 100% 100%   Gen: In bed, NAD Resp: non-labored breathing, no acute distress Abd: soft, nt  Neuro: MS: eyes partially open at rest, does not follow commands, he grimaces and moves his head in response to noxious stimuli.  NW:GNFAOCN:PERRL, eyes dysconjugate, corneals intact, cough intact.  Motor: He at times withdraws and at times extensor postures in bilateral upper extremities.  Sensory:he does respond to pain x 4   Impression: 76 year old male with decreased mental status and posturing. Possibilities include embolic shower versus anoxic brain injury.    His exam is unusual, and though he is posturing, he has cortically driven movements as well. I'm not certain I can predict with any degree of certainty what his likelihood of recovery is, but he could have improvement to the point of interactiveness and possibly even independent living, but I think that this would be a long-term recovery rather than any expectation of rapid improvement in short-term.  An MRI of the brain would be helpful, and I discussed with Dr. Morton PetersVan Tright the timing from surgery and he suggested waiting another week.    Recommendations: 1) MRI brain once feasible.  2) Will check ammonia, TSH 3) Will follow.   Ritta SlotMcNeill Anhthu Perdew, MD [facility] [phone]  If 7pm- 7am, please page neurology on call as listed in AMION.

## 2017-06-21 NOTE — Progress Notes (Signed)
[facility] / CRITICAL CARE MEDICINE   Name: Philip Love MRN: [id number] DOB: 01-13-1941    ADMISSION DATE:  05/21/2017 CONSULTATION DATE:  06/11/2017  REFERRING MD:  Morton Peters  CHIEF COMPLAINT:  Respiratory Decompensation/ vent management  Brief Patient Summary:   76 year old obese former smoker with OSA , and insulin-dependent diabetes recently moved from [place] to [facility]. He presented to the emergency department there with 2 weeks of progressive shortness of breath ankle swelling and chest discomfort. He had nonspecific EKG changes but was in sinus rhythm. Cardiac enzymes were mildly positive and chest x-ray showed mild CHF. He was transferred to this hospital. His creatinine was 2. Echocardiogram showed EF of 45%. No significant MR. Creatinine improved to 1.7 and he underwent coronary angiography and right heart cath. Coronary showed a 90% left main stenosis, proximal 80-90% LAD stenosis, 90% stenosis of the mid RCA and 80% stenosis of the proximal circumflex. LVEDP is 12. Right heart cath showed cardiac output of 5.5 L/m with normal right-sided pressures. The patient was admitted to the ICU placed on IV heparin and nitroglycerin. His creatinine post cath remains at 1.7.  Blood sugars are fairly well controlled.Patient underwent CABG x 3 with TEE using Left internal mammary artery and right saphenous leg vein on  06/07/2017. He was recovering in the CVICU, was extubated 06/10/2017. He refused CPAP the night of 7/30-7/31> He was in atrial fibrillation and when bolused with Amio he had a rhythm change with long pauses and significant change in mental status requiring emergent re-intubation per Dr. Delton Coombes 7/31. ABG showed a metabolic acidosis.( 7.311/ 41.5/390/22/ 21.1)  CCM consulted for vent management and evaluation of respiratory decompensation.    STUDIES:  Echo 06/06/2017>>EF 40-45%, Moderate LVH, Mildly to moderately  reduced systolic dysfunction, MV mild regurgitation, LA  mildly dilated 7/27 LHC/RHC >>90% left main stenosis, proximal 80-90% LAD stenosis, 90% stenosis of the mid RCA and 80% stenosis of the proximal circumflex. LVEDP is 12, cardiac output of 5.5 L/m with normal right-sided pressures. EEG 8/7 > moderate diffuse slowing, no focal, hemispheric or lateralizing features, no epileptiform activity  CT head 8/7 > No acute large vascular territory infarction, intracranial hemorrhage, or focal mass effect; hypoattenuation bilateral medial basal ganglia can be associated/w anoxic or metabolic injury, age indeterminate; right inferior cerebellar lucency c/w age-indeterminate lacunar infarction; nonspecific foci of hypoattenuation in white matter compatible/w microvascular ischemic changes; mild brain parenchymal volume loss.  CULTURES: 7/27 MRSA PCR >> neg 7/27 surgical screen- urine >>MSSA postive Sputum 7/31>> multiple organism present, none predominant Respiratory 8/3 >> normal Trach asp 8/5 >> ngtd [facility] 8/7 >> Trach asp 8/7 >> UC 8/7 >> neg Cdif 8/8 >> neg   ANTIBIOTICS: Ceftazidine 7/31>> 8/7 Zinacef 7/29 preop Zosyn 8/7 >> Vanc 8/7 >>  LINES/TUBES: R IJ Swan 7/29 >> 8/6 Left and Mediastinal CT>> 7/29 R Radial A Line 7/29 >>8/1 ETT 7/31 >> 8/1 Left femoral Aline 8/1 >8/6 L IJ HD cath 8/2 >> R SVC CVL 8/7 >>  SIGNIFICANT EVENTS: 06/07/2017>> CABG 06/10/2017>> Extubation 06/11/2017>> Emergent re-intubation for mixed respiratory/metabolic acidosis 06/12/2017 > self extubated -> BiPAP 06/13/17 > CRRT 06/20/17 - Only sedation since 8/6 was 25 mcg of fentanyl on 8/8 around 0715 Weaned PSV x 4 hrs 8/8 Per RN, patient bit his tongue last night- with some bleeding, now has bite block Neuro consult for today per primary team CRRT  Continues w/NF ~150 ml/hr    SUBJECTIVE/OVERNIGHT/INTERVAL HX 06/21/17:  Pe neuro yesterday, "I'm not certain  I can predict with any degree of certainty what his likelihood of recovery is, but he could have improvement to  the point of interactiveness and possibly even independent living, but I think that this would be a long-term recovery rather than any expectation of rapid improvement in short-term."  Remains on vent, CRRT , levophed and milrinone. Remains comatose  VITAL SIGNS: BP (!) 88/44   Pulse (!) 118   Temp 98.2 F (36.8 C) (Core (Comment))   Resp (!) 26   Ht 5\' 8"  (1.727 m)   Wt 105.2 kg (231 lb 14.8 oz)   SpO2 100%   BMI 35.26 kg/m   HEMODYNAMICS: CVP:  [3 mmHg-9 mmHg] 3 mmHg  VENTILATOR SETTINGS: Vent Mode: PRVC FiO2 (%):  [40 %] 40 % Set Rate:  [22 bmp] 22 bmp Vt Set:  [550 mL] 550 mL PEEP:  [5 cmH20] 5 cmH20 Plateau Pressure:  [25 cmH20-26 cmH20] 26 cmH20  INTAKE / OUTPUT: I/O last 3 completed shifts: In: 2633.6 [I.V.:870.6; Other:250; NG/GT:1047.5; IV Piggyback:465.5] Out: 8631 [Urine:30; WJXBJ:4782; Stool:570]  PHYSICAL EXAMINATION:   General Appearance:    Looks criticall ill OBESE - yes  Head:    Normocephalic, without obvious abnormality, atraumatic  Eyes:    PERRL - yes, conjunctiva/corneas - clear      Ears:    Normal external ear canals, both ears  Nose:   NG tube - no  Throat:  ETT TUBE - yes , OG tube - y  Neck:   Supple,  No enlargement/tenderness/nodules     Lungs:     Clear to auscultation bilaterally, Ventilator   Synchrony - yes. RR 24 with vent set  At 22  Chest wall:    No deformity  Heart:    S1 and S2 normal, no murmur, CVP - na.  Pressors - levophed and milrinone  Abdomen:     Soft, no masses, no organomegaly  Genitalia:    Not done  Rectal:   not done  Extremities:   Extremities- edema     Skin:   Intact in exposed areas     Neurologic:   Sedation - none -> RASS - -4     LABS:  BMET  Recent Labs Lab 06/20/17 0359 06/20/17 1537 06/21/17 0430  NA 138 133* 136  K 3.8 3.7 3.6  CL 100* 99* 98*  CO2 26 23 25   BUN 33* 32* 39*  CREATININE 1.84* 1.87* 1.80*  GLUCOSE 165* 212* 224*    Electrolytes  Recent Labs Lab 06/19/17 0419   06/20/17 0359 06/20/17 1537 06/21/17 0430  CALCIUM 8.0*  8.1*  < > 8.5* 8.7* 8.8*  MG 2.6*  --  2.6*  --  2.8*  PHOS 2.8  < > 2.4* 1.9* 2.0*  < > = values in this interval not displayed.  CBC  Recent Labs Lab 06/19/17 0419 06/20/17 0359 06/21/17 0430  WBC 17.5* 24.4* 31.7*  HGB 8.2* 8.3* 8.5*  HCT 25.4* 26.1* 26.8*  PLT 205 234 258    Coag's No results for input(s): APTT, INR in the last 168 hours.  Sepsis Markers No results for input(s): LATICACIDVEN, PROCALCITON, O2SATVEN in the last 168 hours.  ABG  Recent Labs Lab 06/15/17 1609 06/16/17 0348 06/29/2017 0333  PHART 7.418 7.399 7.393  PCO2ART 36.7 39.1 41.2  PO2ART 87.0 93.0 98.0    Liver Enzymes  Recent Labs Lab 06/16/17 0350  06/18/17 0405  06/19/17 0419  06/20/17 0359 06/20/17 1537 06/21/17 0430  AST 37  --  56*  --  50*  --   --   --   --   ALT 17  --  19  --  22  --   --   --   --   ALKPHOS 147*  --  325*  --  299*  --   --   --   --   BILITOT 1.3*  --  1.1  --  1.1  --   --   --   --   ALBUMIN 2.9*  < > 3.2*  < > 3.4*  3.4*  < > 3.9 4.2 4.4  < > = values in this interval not displayed.  Cardiac Enzymes No results for input(s): TROPONINI, PROBNP in the last 168 hours.  Glucose  Recent Labs Lab 06/20/17 0813 06/20/17 1214 06/20/17 1543 06/20/17 2020 06/20/17 2351 06/21/17 0414  GLUCAP 141* 231* 315* 158* 261* 218*    Imaging No results found. 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 Frequent ectopy   - shock continuesAmio off 8/8 P: Telemetry monitoring  Per CVTS Continue Levophed, milrinone Trend co-ox  RENAL A:   AoCKD  - remains oliguric 80ml/12hr  P:   Per Renal Continue CVVHD with volume removal   GASTROINTESTINAL A:   No acute Issues  P:   [id number] Bowel regimen Continue TF's  HEMATOLOGIC A:   Anemia- stable P:  Follow CBC Transfuse for HGB <8 DVT prophylaxis: SCD in place  INFECTIOUS Results for orders placed or performed during the hospital encounter of 06/08/2017  MRSA PCR Screening     Status: None   Collection Time: 06/03/2017 10:18 PM  Result Value Ref Range Status   MRSA by PCR NEGATIVE NEGATIVE Final    Comment:        The GeneXpert MRSA Assay (FDA approved for NASAL specimens only), is one component of a comprehensive MRSA colonization surveillance program. It is not intended to diagnose MRSA infection nor to guide or monitor treatment for MRSA infections.   Surgical pcr screen     Status: Abnormal   Collection Time: 06/11/2017 11:15 PM  Result Value Ref Range Status   MRSA, PCR NEGATIVE NEGATIVE Final   Staphylococcus aureus POSITIVE (A) NEGATIVE Final    Comment:        The Xpert SA Assay (FDA approved for NASAL specimens in patients over 82 years of age), is one component of a comprehensive surveillance program.  Test performance has been validated by [facility] for patients greater than or equal to 45 year old. It is not intended to diagnose infection nor to guide or monitor treatment.   Culture, respiratory (NON-Expectorated)     Status: Abnormal   Collection Time: 06/11/17  9:00 AM  Result Value Ref Range Status   Specimen Description TRACHEAL ASPIRATE  Final   Special Requests Normal  Final   Gram Stain   Final    FEW WBC PRESENT, PREDOMINANTLY PMN RARE SQUAMOUS EPITHELIAL CELLS PRESENT RARE GRAM NEGATIVE RODS    Culture  MULTIPLE ORGANISMS PRESENT, NONE PREDOMINANT (A)  Final    Report Status 06/13/2017 FINAL  Final  Culture, respiratory (NON-Expectorated)     Status: None   Collection Time: 06/14/17  3:20 PM  Result Value Ref Range Status   Specimen Description TRACHEAL ASPIRATE  Final   Special Requests NONE  Final   Gram Stain   Final    MODERATE WBC PRESENT,BOTH PMN AND MONONUCLEAR NO ORGANISMS SEEN    Culture Consistent with normal respiratory flora.  Final   Report Status 06/23/2017 FINAL  Final  Culture, respiratory (NON-Expectorated)     Status: None   Collection Time: 06/16/17  3:23 PM  Result Value Ref Range Status   Specimen Description TRACHEAL ASPIRATE  Final   Special Requests NONE  Final   Gram Stain   Final    ABUNDANT WBC PRESENT, PREDOMINANTLY PMN RARE SQUAMOUS EPITHELIAL CELLS PRESENT NO ORGANISMS SEEN    Culture NO GROWTH 2 DAYS  Final   Report Status 06/18/2017 FINAL  Final  Urine Culture     Status: None   Collection Time: 06/18/17 12:06 PM  Result Value Ref Range Status   Specimen Description URINE, CATHETERIZED  Final   Special Requests NONE  Final   Culture NO GROWTH  Final   Report Status 06/19/2017 FINAL  Final  Culture, respiratory (NON-Expectorated)     Status: None   Collection Time: 06/18/17 12:06 PM  Result Value Ref Range Status   Specimen Description TRACHEAL ASPIRATE  Final   Special Requests NONE  Final   Gram Stain   Final    RARE WBC PRESENT, PREDOMINANTLY PMN NO ORGANISMS SEEN    Culture NO GROWTH 2 DAYS  Final   Report Status 06/20/2017 FINAL  Final  Culture, blood (Routine X 2) w Reflex to ID Panel     Status: None (Preliminary result)   Collection Time: 06/18/17  2:10 PM  Result Value Ref Range Status   Specimen Description BLOOD RIGHT ARM  Final   Special Requests IN PEDIATRIC BOTTLE Blood Culture adequate volume  Final   Culture NO GROWTH 2 DAYS  Final   Report Status PENDING  Incomplete  Culture, blood (Routine X 2) w Reflex to ID Panel     Status: None (Preliminary result)   Collection Time:  06/18/17  2:10 PM  Result Value Ref Range Status   Specimen Description BLOOD RIGHT ARM  Final   Special Requests IN PEDIATRIC BOTTLE Blood Culture adequate volume  Final   Culture NO GROWTH 2 DAYS  Final   Report Status PENDING  Incomplete  C difficile quick scan w PCR reflex     Status: None   Collection Time: 06/19/17  6:41 PM  Result Value Ref Range Status   C Diff antigen NEGATIVE NEGATIVE Final   C Diff toxin NEGATIVE NEGATIVE Final   C Diff interpretation No C. difficile detected.  Final    A:   ? HCAP Leukocytosis  - afebrile P:   Anti-infectives    Start     Dose/Rate Route Frequency Ordered Stop   06/21/17 0900  fluconazole (DIFLUCAN) IVPB 100 mg     100 mg 50 mL/hr over 60 Minutes Intravenous Every 24 hours 06/21/17 0815 06/24/17 0859   06/20/17 1130  vancomycin (VANCOCIN) IVPB 750 mg/150 ml premix     750 mg 150 mL/hr over 60 Minutes Intravenous Every 24 hours 06/19/17 1319     06/19/17 1130  vancomycin (VANCOCIN) IVPB 1000 mg/200 mL premix  Status:  Discontinued     1,000 mg 200 mL/hr over 60 Minutes Intravenous Every 24 hours 06/18/17 0943 06/19/17 1319   06/18/17 1100  piperacillin-tazobactam (ZOSYN) IVPB 3.375 g     3.375 g 12.5 mL/hr over 240 Minutes Intravenous Every 6 hours 06/18/17 0943     06/18/17 1100  vancomycin (VANCOCIN) 2,000 mg in sodium chloride 0.9 % 500 mL IVPB     2,000 mg 250 mL/hr over 120 Minutes Intravenous  Once 06/18/17 0943 06/18/17 1224   06/18/17 0945  vancomycin (VANCOCIN) IVPB 1000 mg/200 mL premix  Status:  Discontinued    Comments:  Dose per pharm D  please get level 24 hr after first dose   1,000 mg 200 mL/hr over 60 Minutes Intravenous  Once 06/18/17 0930 06/18/17 0933   06/18/17 0930  vancomycin (VANCOCIN) IVPB 1000 mg/200 mL premix  Status:  Discontinued     1,000 mg 200 mL/hr over 60 Minutes Intravenous  Once 06/18/17 0928 06/18/17 0930   06/18/17 0930  piperacillin-tazobactam (ZOSYN) IVPB 3.375 g  Status:  Discontinued     Comments:  Dose per pharm   3.375 g 12.5 mL/hr over 240 Minutes Intravenous Every 8 hours 06/18/17 0929 06/18/17 0933   06/13/17 2200  cefTAZidime (FORTAZ) 2 g in dextrose 5 % 50 mL IVPB  Status:  Discontinued     2 g 100 mL/hr over 30 Minutes Intravenous Every 12 hours 06/13/17 1656 06/18/17 0755   06/11/17 1200  cefTAZidime (FORTAZ) 2 g in dextrose 5 % 50 mL IVPB  Status:  Discontinued     2 g 100 mL/hr over 30 Minutes Intravenous Every 24 hours 06/11/17 0905 06/13/17 1656   06/11/17 0900  cefTAZidime (FORTAZ) 1 g in dextrose 5 % 50 mL IVPB  Status:  Discontinued     1 g 100 mL/hr over 30 Minutes Intravenous Every 8 hours 06/11/17 0858 06/11/17 0904   06/11/2017 2130  vancomycin (VANCOCIN) IVPB 1000 mg/200 mL premix     1,000 mg 200 mL/hr over 60 Minutes Intravenous  Once 06/08/2017 1454 06/06/2017 2205   06/04/2017 1730  cefUROXime (ZINACEF) 1.5 g in dextrose 5 % 50 mL IVPB     1.5 g 100 mL/hr over 30 Minutes Intravenous Every 12 hours 05/16/2017 1454 06/11/17 0530   05/19/2017 0400  vancomycin (VANCOCIN) 1,500 mg in sodium chloride 0.9 % 250 mL IVPB     1,500 mg 125 mL/hr over 120 Minutes Intravenous To Surgery 06/08/17 2154 06/01/2017 0918   05/28/2017 0400  cefUROXime (ZINACEF) 1.5 g in dextrose 5 % 50 mL IVPB     1.5 g 100 mL/hr over 30 Minutes Intravenous To Surgery 06/08/17 2154 05/13/2017 1332   06/10/2017 0400  cefUROXime (ZINACEF) 750 mg in dextrose 5 % 50 mL IVPB  Status:  Discontinued     750 mg 100 mL/hr over 30 Minutes Intravenous To Surgery 06/08/17 1144 06/07/2017 1454       ENDOCRINE A:   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  Family conference held 06/19/17 fternoon with Dr. Donata Clay with plans for neurology consult this  morning due to poor responsiveness with holding sedation for several days.  Family  at that time stated they did not wish for prolonged aggressive therapy if patient would not have a reasonable chance of recovery.   - Inter-disciplinary family meet or Palliative Care meeting due - ongoin    The patient is critically ill with multiple organ systems failure and requires high complexity decision making for assessment and support, frequent evaluation and titration of therapies, application of advanced monitoring technologies and extensive interpretation of multiple databases.   Critical Care Time devoted to patient care services described in this note is  30  Minutes. This time reflects time of care of this signee Dr Kalman ShanMurali Anne Sebring. This critical care time does not reflect procedure time, or teaching time or supervisory time of PA/NP/Med student/Med Resident etc but could involve care discussion time    Dr. Kalman ShanMurali Trinadee Verhagen, M.D., [facility] HospitalF.C.C.P Pulmonary and Critical Care Medicine Staff Physician [facility] [place] [facility] Pager: [phone], If no answer or between  15:00h - 7:00h: call [phone]  06/21/2017 8:27 AM

## 2017-06-21 NOTE — Progress Notes (Signed)
Pt transported to/from CT via vent w/ no apparent complications.   

## 2017-06-21 NOTE — Progress Notes (Signed)
Patient ID: Philip Love, male   DOB: 1940-12-21, 76 y.o.   MRN: [id number] SICU Evening Rounds:  He remains labile on levophed 18 mcg. Most likely sepsis with increasing WBC ct and normal EF. On vanc,Zosyn, and Diflucan. CVP is 5. Weight is at preop so he is probably getting dried out.   Atrial fib with rate low 100's  Remains on vent, unresponsive  Anuric on CRRT.  BMET    Component Value Date/Time   NA 138 06/21/2017 1633   K 3.4 (L) 06/21/2017 1633   CL 101 06/21/2017 1633   CO2 25 06/21/2017 1633   GLUCOSE 172 (H) 06/21/2017 1633   BUN 39 (H) 06/21/2017 1633   CREATININE 1.88 (H) 06/21/2017 1633   CALCIUM 11.6 (H) 06/21/2017 1633   GFRNONAA 34 (L) 06/21/2017 1633   GFRAA 39 (L) 06/21/2017 1633

## 2017-06-21 NOTE — Progress Notes (Signed)
[facility] Rounding Note  Subjective:    Patient is intubated on the vent.  Unable to follow commands, will retract with pain.  CRRT continues to run well.  Weight is down from a max of 130.1 kg to 105.2 kg today (adm weight 104 kg)  Objective Vital signs in last 24 hours: Vitals:   06/21/17 0700 06/21/17 0800 06/21/17 0804 06/21/17 0808  BP: (!) 101/59 (!) 88/44 (!) 88/44   Pulse: (!) 106 (!) 121 (!) 118   Resp: (!) 24 (!) 23 (!) 26   Temp: 98.2 F (36.8 C) 98.2 F (36.8 C)    TempSrc:  Core (Comment)    SpO2: 100% 100% 100% 100%  Weight:      Height:       Weight change: -1 lb 1.6 oz (-0.5 kg)  Intake/Output Summary (Last 24 hours) at 06/21/17 0848 Last data filed at 06/21/17 0800  Gross per 24 hour  Intake           1912.9 ml  Output             6150 ml  Net          -4237.1 ml   Physical Exam: CVP 6 General: on vent Heart: RRR Sternotomy scar healing/clean and dry.   Lungs: rhonchi throughout Abdomen: obese Extremities: mild edema in upper and lower extremities bilaterally much improved. L great toe with some purplish discoloration/sl mottling Access- left IJ vascath placed 8/2 New R [place] line (8/7)   Recent Labs Lab 06/20/17 0359 06/20/17 1537 06/21/17 0430  NA 138 133* 136  K 3.8 3.7 3.6  CL 100* 99* 98*  CO2 26 23 25   GLUCOSE 165* 212* 224*  BUN 33* 32* 39*  CREATININE 1.84* 1.87* 1.80*  CALCIUM 8.5* 8.7* 8.8*  PHOS 2.4* 1.9* 2.0*    Recent Labs Lab 06/16/17 0350  06/18/17 0405  06/19/17 0419  06/20/17 0359 06/20/17 1537 06/21/17 0430  AST 37  --  56*  --  50*  --   --   --   --   ALT 17  --  19  --  22  --   --   --   --   ALKPHOS 147*  --  325*  --  299*  --   --   --   --   BILITOT 1.3*  --  1.1  --  1.1  --   --   --   --   PROT 5.2*  --  6.1*  --  6.2*  --   --   --   --   ALBUMIN 2.9*  < > 3.2*  < > 3.4*  3.4*  < > 3.9 4.2 4.4  < > = values in this interval not displayed.   Recent Labs Lab 06/22/2017 0317 06/18/17 0405 06/19/17 0419  06/20/17 0359 06/21/17 0430  WBC 17.8* 18.9* 17.5* 24.4* 31.7*  HGB 8.3* 8.2* 8.2* 8.3* 8.5*  HCT 24.5* 25.1* 25.4* 26.1* 26.8*  MCV 84.8 86.6 88.8 90.0 89.3  PLT 200 194 205 234 258    Recent Labs Lab 06/20/17 1543 06/20/17 2020 06/20/17 2351 06/21/17 0414 06/21/17 0814  GLUCAP 315* 158* 261* 218* 194*    Studies/Results: Dg Chest Port 1 View  Result Date: 06/20/2017 CLINICAL DATA:  Respiratory failure EXAM: PORTABLE CHEST 1 VIEW COMPARISON:  06/19/2017 FINDINGS: Cardiac shadow is mildly enlarged but stable. Postsurgical changes are again seen. Endotracheal tube and nasogastric catheter and right subclavian central line  are again seen and stable. Bibasilar atelectatic changes are noted worse on the right than the left. Elevation right hemidiaphragm is noted. IMPRESSION: Bibasilar atelectasis stable from the previous exam. No new focal abnormality is noted. Electronically Signed   By: Inez Catalina M.D.   On: 06/20/2017 08:08   Medications: Infusions: . albumin human 12.5 g (06/15/17 0327)  . albumin human    . amiodarone 150 mg (06/21/17 0840)  . fluconazole (DIFLUCAN) IV    . lactated ringers    . lactated ringers Stopped (06/30/2017 0400)  . milrinone 0.25 mcg/kg/min (06/21/17 0618)  . norepinephrine (LEVOPHED) Adult infusion 20 mcg/min (06/21/17 0500)  . piperacillin-tazobactam (ZOSYN)  IV 3.375 g (06/21/17 0500)  . dialysis replacement fluid (prismasate) 300 mL/hr at 06/20/17 2020  . dialysis replacement fluid (prismasate) 300 mL/hr at 06/20/17 2020  . dialysate (PRISMASATE) 1,500 mL/hr at 06/21/17 0505  . propofol (DIPRIVAN) infusion Stopped (06/20/17 0830)  . sodium chloride    . vancomycin Stopped (06/20/17 1216)    Scheduled Medications: . amiodarone  200 mg Per Tube Daily  . aspirin EC  325 mg Oral Daily   Or  . aspirin  324 mg Per Tube Daily  . atorvastatin  80 mg Per Tube q1800  . bisacodyl  10 mg Oral Daily   Or  . bisacodyl  10 mg Rectal Daily  .  chlorhexidine gluconate (MEDLINE KIT)  15 mL Mouth Rinse BID  . Chlorhexidine Gluconate Cloth  6 each Topical q morning - 10a  . darbepoetin (ARANESP) injection - DIALYSIS  100 mcg Intravenous Q Mon-HD  . docusate  200 mg Oral Daily  . enoxaparin (LOVENOX) injection  40 mg Subcutaneous Daily  . feeding supplement (NEPRO CARB STEADY)  1,000 mL Per Tube Q24H  . feeding supplement (PRO-STAT SUGAR FREE 64)  60 mL Per Tube QID  . insulin aspart  0-24 Units Subcutaneous Q4H  . insulin glargine  30 Units Subcutaneous BID  . levalbuterol  0.63 mg Nebulization Q6H  . mouth rinse  15 mL Mouth Rinse 10 times per day  . metoprolol tartrate  12.5 mg Per Tube BID   Or  . metoprolol tartrate  12.5 mg Per Tube BID  . pantoprazole sodium  40 mg Per Tube QHS  . potassium & sodium phosphates  2 packet Per Tube Daily  . sodium chloride flush  10-40 mL Intracatheter Q12H   Background 76 y.o. male with past medical history hypertension, diabetes mellitus, OSA, chronic low back pain. Presented 7/25 with SOB/HF symptoms,  troponin was elevated, creatinine 1.4. Cath done 7/27 (w/creatinine 1.96 and after that creatinine improved to 1.1-1.2) ->3V ds. CABG 7/29 with progressive rise in creatinine post op, development of oliguria, failed extubation, marked volume overload. Renal was asked to see. Failed lasix challenge, CRRT initiated 8/2.   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  BL creatinine around 1.4 Urinalysis bland.  Given the scenario likely ATN from hemodynamic cause.  Persistent anuric AKI Patient started CRRT on 8/2.   Still with volume overloaded, will try and remove more fluid as patient's blood pressure allows.  Currently pulling about 150cc/hr will decrease to 100cc/hr  So far weight down from 130kg->105.2 kg with adm weight noted to be 104.3 kg, so now very close to baseline weight and edema markedly better  Plan -continue CRRT reduce UF goal 50-100  Blood pressure Patient is tolerating CRRT well,  volume much inproved, CVP down to 6 Patient still requiring pressors, had to increase  overnight.   Still with edema.   Weight down 25kg since start of CRRT. - continue CRRT lower UF goal -weaning pressors as tolerated  Electrolytes/phosphatemia   Potassium, and sodium okay, phos (2.0)   Plan - daily 30 mm phos IV will be resumed (had been switched to enteral replacement with drop in phos)  Normocytic Anemia   Situational secondary to surgery- supportive care and transfuse as needed Transfused 1 unit of PRBC on 8/5.   Today hemoglobin 8.5 and stable.   Iron -13, TIBC -154, saturation ratio 8.  Patient started on Aranesp 100 mcg.  Will need IV Fe for tsat 8% once off ATB's  Plan -aranesp 113mg weekly     Acute respiratory failure Required re intubation on 8/3.  Pulm edema + pos HCAP.  Fortaz through 8/7.  Resp cx 8/5 with no growth to date.   MSSA nasal swab positive.  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 VDarcey Nora Acute encephalopathy Patient is unresponsive to commands, and has extensor posturing.  EEG 8/7 demonstrating moderate diffuse slowing of electrocerebral activity. And Head CT 8/7  showed no acute large vascular territory infarction, intracranial hemorrhage, or focal mass effect. Hypoattenuation bilateral medial basal ganglia can be associated with anoxic or metabolic injury.   Neurology has been consulted and recommended MRI when safe from CT surgery standpoint.   JBoyd Kerbs PGY-2, Internal medicine Pager: [phone]/08/2017,8:48 AM  LOS: 16 days    Agree with the above note of Dr. HHeber Ronks CRRT running well. Volume down substantially and able to reduce UF goal 50-100 as tolerated. CVP down to 6. Changing back to IV phos repletion. Neuro unchanged. Await stability to proceed with MRI.  CJamal Maes MD [facility] [phone] 06/21/2017, 10:39 AM

## 2017-06-21 NOTE — Progress Notes (Signed)
12 Days Post-Op Procedure(s) (LRB): CORONARY ARTERY BYPASS GRAFTING (CABG) x3 WITH TEE USING LEFT INTERNAL MAMMARY ARTERY AND RIGHT  SAPHENOUS LEG VEIN HARVESTED ENDOSCOPICALLY (N/A) Subjective: No purposeful response - repeat head CT afib with low cvp, low co-ox -- amio bolus and albumin  dose Diarrhea, cvvh- prob low intravascular volume Rising WBC - cultures negative. Add difucan, cont empiric antibiotics Objective: Vital signs in last 24 hours: Temp:  [96.8 F (36 C)-98.6 F (37 C)] 98.2 F (36.8 C) (08/10 0700) Pulse Rate:  [71-123] 106 (08/10 0700) Cardiac Rhythm: Atrial fibrillation (08/10 0400) Resp:  [18-35] 24 (08/10 0700) BP: (89-118)/(35-74) 101/59 (08/10 0700) SpO2:  [100 %] 100 % (08/10 0700) FiO2 (%):  [40 %] 40 % (08/10 0700) Weight:  [231 lb 14.8 oz (105.2 kg)] 231 lb 14.8 oz (105.2 kg) (08/10 0300)  Hemodynamic parameters for last 24 hours: CVP:  [6 mmHg-9 mmHg] 9 mmHg  Intake/Output from previous day: 08/09 0701 - 08/10 0700 In: 1912 [I.V.:597; NG/GT:927.5; IV Piggyback:337.5] Out: 6094 [Urine:30; Stool:570] Intake/Output this shift: No intake/output data recorded.       Exam    General- on vent no purposeful response   Lungs- clear without rales, wheezes   Cor- afib rate 102 no murmur , gallop   Abdomen- soft, non-tender   Extremities - warm, non-tender, minimal edema   Neuro-  no focal weakness   Lab Results:  Recent Labs  06/20/17 0359 06/21/17 0430  WBC 24.4* 31.7*  HGB 8.3* 8.5*  HCT 26.1* 26.8*  PLT 234 258   BMET:  Recent Labs  06/20/17 1537 06/21/17 0430  NA 133* 136  K 3.7 3.6  CL 99* 98*  CO2 23 25  GLUCOSE 212* 224*  BUN 32* 39*  CREATININE 1.87* 1.80*  CALCIUM 8.7* 8.8*    PT/INR: No results for input(s): LABPROT, INR in the last 72 hours. ABG    Component Value Date/Time   PHART 7.393 06/21/2017 0333   HCO3 25.3 06/29/2017 0333   TCO2 27 07/10/2017 0333   ACIDBASEDEF 1.0 06/16/2017 0348   O2SAT 47.0 06/21/2017  0415   CBG (last 3)   Recent Labs  06/20/17 2020 06/20/17 2351 06/21/17 0414  GLUCAP 158* 261* 218*    Assessment/Plan: S/P Procedure(s) (LRB): CORONARY ARTERY BYPASS GRAFTING (CABG) x3 WITH TEE USING LEFT INTERNAL MAMMARY ARTERY AND RIGHT  SAPHENOUS LEG VEIN HARVESTED ENDOSCOPICALLY (N/A) Repeat CT Brain MRI when safe post CABG [ 3 weeks] Cont full support  LOS: 16 days    Kathlee Nationseter Van Trigt III 06/21/2017

## 2017-06-21 NOTE — Progress Notes (Signed)
CT Surgery  CT Brain repeated- results d/w family New findings of several small, bilateral hypodense areas consistent with ischemic injury- embolic or anoxic Patient remains in a-fib- will transition from lovenox to heparin infusion Plan more definitive brain MRI next week

## 2017-06-22 ENCOUNTER — Inpatient Hospital Stay (HOSPITAL_COMMUNITY): Payer: Medicare Other

## 2017-06-22 LAB — RENAL FUNCTION PANEL
ALBUMIN: 4.2 g/dL (ref 3.5–5.0)
ANION GAP: 8 (ref 5–15)
ANION GAP: 14 (ref 5–15)
Albumin: 3.7 g/dL (ref 3.5–5.0)
BUN: 38 mg/dL — ABNORMAL HIGH (ref 6–20)
BUN: 36 mg/dL — ABNORMAL HIGH (ref 6–20)
CALCIUM: 8.6 mg/dL — AB (ref 8.9–10.3)
CHLORIDE: 100 mmol/L — AB (ref 101–111)
CO2: 30 mmol/L (ref 22–32)
CO2: 21 mmol/L — AB (ref 22–32)
Calcium: 9.1 mg/dL (ref 8.9–10.3)
Chloride: 101 mmol/L (ref 101–111)
Creatinine, Ser: 1.84 mg/dL — ABNORMAL HIGH (ref 0.61–1.24)
Creatinine, Ser: 1.62 mg/dL — ABNORMAL HIGH (ref 0.61–1.24)
GFR calc Af Amer: 40 mL/min — ABNORMAL LOW (ref >60)
GFR calc non Af Amer: 40 mL/min — ABNORMAL LOW (ref >60)
GFR, EST AFRICAN AMERICAN: 47 mL/min — AB (ref >60)
GFR, EST NON AFRICAN AMERICAN: 35 mL/min — AB (ref >60)
GLUCOSE: 239 mg/dL — AB (ref 65–99)
Glucose, Bld: 192 mg/dL — ABNORMAL HIGH (ref 65–99)
PHOSPHORUS: 2.4 mg/dL — AB (ref 2.5–4.6)
PHOSPHORUS: 3.1 mg/dL (ref 2.5–4.6)
Potassium: 4.1 mmol/L (ref 3.5–5.1)
Potassium: 3.4 mmol/L — ABNORMAL LOW (ref 3.5–5.1)
SODIUM: 136 mmol/L (ref 135–145)
Sodium: 138 mmol/L (ref 135–145)

## 2017-06-22 LAB — HEPARIN LEVEL (UNFRACTIONATED)
Heparin Unfractionated: 0.27 IU/mL — ABNORMAL LOW (ref 0.30–0.70)
Heparin Unfractionated: 0.32 IU/mL (ref 0.30–0.70)

## 2017-06-22 LAB — CBC
HCT: 24.8 % — ABNORMAL LOW (ref 39.0–52.0)
Hemoglobin: 7.7 g/dL — ABNORMAL LOW (ref 13.0–17.0)
MCH: 28 pg (ref 26.0–34.0)
MCHC: 31 g/dL (ref 30.0–36.0)
MCV: 90.2 fL (ref 78.0–100.0)
Platelets: 259 10*3/uL (ref 150–400)
RBC: 2.75 MIL/uL — ABNORMAL LOW (ref 4.22–5.81)
RDW: 16.7 % — ABNORMAL HIGH (ref 11.5–15.5)
WBC: 35.5 10*3/uL — ABNORMAL HIGH (ref 4.0–10.5)

## 2017-06-22 LAB — COOXEMETRY PANEL
Carboxyhemoglobin: 1.2 % (ref 0.5–1.5)
Methemoglobin: 1.4 % (ref 0.0–1.5)
O2 Saturation: 52.9 %
Total hemoglobin: 7.8 g/dL — ABNORMAL LOW (ref 12.0–16.0)

## 2017-06-22 LAB — GLUCOSE, CAPILLARY
GLUCOSE-CAPILLARY: 219 mg/dL — AB (ref 65–99)
GLUCOSE-CAPILLARY: 198 mg/dL — AB (ref 65–99)
Glucose-Capillary: 122 mg/dL — ABNORMAL HIGH (ref 65–99)

## 2017-06-22 LAB — POCT I-STAT 3, ART BLOOD GAS (G3+)
Bicarbonate: 24.2 mmol/L (ref 20.0–28.0)
O2 SAT: 98 %
Patient temperature: 99.7
TCO2: 25 mmol/L (ref 0–100)
pCO2 arterial: 38.6 mmHg (ref 32.0–48.0)
pH, Arterial: 7.407 (ref 7.350–7.450)
pO2, Arterial: 105 mmHg (ref 83.0–108.0)

## 2017-06-22 LAB — PREPARE RBC (CROSSMATCH)

## 2017-06-22 LAB — MAGNESIUM: MAGNESIUM: 2.6 mg/dL — AB (ref 1.7–2.4)

## 2017-06-22 LAB — VANCOMYCIN, RANDOM: Vancomycin Rm: 18

## 2017-06-22 MED ORDER — VANCOMYCIN HCL IN DEXTROSE 750-5 MG/150ML-% IV SOLN
750.0000 mg | INTRAVENOUS | Status: DC
Start: 2017-06-22 — End: 2017-06-25
  Administered 2017-06-22 – 2017-06-24 (×3): 750 mg via INTRAVENOUS
  Filled 2017-06-22 (×4): qty 150

## 2017-06-22 MED ORDER — AMIODARONE HCL IN DEXTROSE 360-4.14 MG/200ML-% IV SOLN
INTRAVENOUS | Status: AC
  Administered 2017-06-22: 150 mg/h
  Filled 2017-06-22: qty 200

## 2017-06-22 MED ORDER — INSULIN GLARGINE 100 UNIT/ML ~~LOC~~ SOLN
40.0000 [IU] | Freq: Two times a day (BID) | SUBCUTANEOUS | Status: DC
  Administered 2017-06-22: 40 [IU] via SUBCUTANEOUS
  Filled 2017-06-22 (×2): qty 0.4

## 2017-06-22 MED ORDER — AMIODARONE IV BOLUS ONLY 150 MG/100ML: 150.0000 mg | Freq: Once | INTRAVENOUS | Status: AC

## 2017-06-22 MED ORDER — POTASSIUM CHLORIDE 10 MEQ/50ML IV SOLN
10.0000 meq | INTRAVENOUS | Status: AC
  Administered 2017-06-22 (×3): 10 meq via INTRAVENOUS
  Filled 2017-06-22: qty 50

## 2017-06-22 MED ORDER — AMIODARONE HCL IN DEXTROSE 360-4.14 MG/200ML-% IV SOLN
60.0000 mg/h | INTRAVENOUS | Status: AC
  Administered 2017-06-22 (×2): 60 mg/h via INTRAVENOUS
  Filled 2017-06-22: qty 200

## 2017-06-22 MED ORDER — SODIUM CHLORIDE 0.9 % IV SOLN
Freq: Once | INTRAVENOUS | Status: AC
  Administered 2017-06-22: 10:00:00 via INTRAVENOUS

## 2017-06-22 MED ORDER — AMIODARONE HCL IN DEXTROSE 360-4.14 MG/200ML-% IV SOLN
30.0000 mg/h | INTRAVENOUS | Status: DC
  Administered 2017-06-23 – 2017-06-24 (×4): 30 mg/h via INTRAVENOUS
  Filled 2017-06-22 (×4): qty 200

## 2017-06-22 MED ORDER — INSULIN GLARGINE 100 UNIT/ML ~~LOC~~ SOLN
32.0000 [IU] | Freq: Two times a day (BID) | SUBCUTANEOUS | Status: DC
  Administered 2017-06-22: 32 [IU] via SUBCUTANEOUS
  Filled 2017-06-22 (×2): qty 0.32

## 2017-06-22 NOTE — Progress Notes (Addendum)
Pharmacy Antibiotic Note  Elam DutchRobert Colberg is a 76 y.o. male admitted on May 15, 2017 with respiratory failure requiring mechanical ventillation. Pt was treated with 7 days of Fortaz, but WBC has increased slightly. Pharmacy has been consulted for vancomycin and Zosyn dosing. Patient is currently on CRRT since 8/2 for volume control. Vancomycin level ordered today within goal at 18, drawn appropriately.  Plan: -Continue Vancomycin to 750 mg IV Q24h -Continue Zosyn 3.375g IV q6h -Monitor CRRT daily, S/Sx infection, LOT, cultures  Height: 5\' 8"  (172.7 cm) Weight: 223 lb 12.3 oz (101.5 kg) IBW/kg (Calculated) : 68.4  Temp (24hrs), Avg:98 F (36.7 C), Min:97.6 F (36.4 C), Max:98.7 F (37.1 C)   Recent Labs Lab 06/18/17 0405  06/19/17 0419 06/19/17 0935  06/20/17 0359 06/20/17 1537 06/21/17 0430 06/21/17 1633 06/22/17 0315  WBC 18.9*  --  17.5*  --   --  24.4*  --  31.7*  --  35.5*  CREATININE 1.80*  < > 1.67*  1.64*  --   < > 1.84* 1.87* 1.80* 1.88* 1.84*  VANCOTROUGH  --   --   --  16  --   --   --   --   --   --   < > = values in this interval not displayed.  Estimated Creatinine Clearance: 41.3 mL/min (A) (by C-G formula based on SCr of 1.84 mg/dL (H)).    Allergies  Allergen Reactions  . Bee Venom Swelling    Antimicrobials this admission: 7/31 Ceftazidime >> 8/6 8/7 Vancomycin >>  8/7 Zosyn >>  Dose adjustments this admission: 8/8: VT = 16 > reduce to 750mg  IV daily 8/11: VT = 18 > continue current dosing  Microbiology results: 7/27 surgical screen - MSSA 7/31 TA - rare GNR 8/3 TA - normal flora 8/5 TA - neg 8/7 TA - neg 8/7 UCx - neg  8/7 Bcx 2 - ngtd  8/8 Cdiff - neg   Thank you for allowing pharmacy to be a part of this patient's care.  Fredonia HighlandMichael Talon Regala, PharmD PGY-2 Cardiology Pharmacy Resident Pager: [phone] 06/22/2017

## 2017-06-22 NOTE — Progress Notes (Signed)
ANTICOAGULATION CONSULT NOTE - Initial Consult  Pharmacy Consult for heparin Indication: atrial fibrillation  Allergies  Allergen Reactions  . Bee Venom Swelling    Patient Measurements: Height: 5\' 8"  (172.7 cm) Weight: 223 lb 12.3 oz (101.5 kg) IBW/kg (Calculated) : 68.4 Heparin Dosing Weight: 89 kg  Vital Signs: Temp: 97.5 F (36.4 C) (08/11 1100) Temp Source: Oral (08/11 1100) BP: 102/67 (08/11 1250) Pulse Rate: 114 (08/11 1250)  Labs:  Recent Labs  06/20/17 0359  06/21/17 0430 06/21/17 1633 06/22/17 0315 06/22/17 1152  HGB 8.3*  --  8.5*  --  7.7*  --   HCT 26.1*  --  26.8*  --  24.8*  --   PLT 234  --  258  --  259  --   HEPARINUNFRC  --   --   --   --   --  0.27*  CREATININE 1.84*  < > 1.80* 1.88* 1.84*  --   < > = values in this interval not displayed.  Estimated Creatinine Clearance: 41.3 mL/min (A) (by C-G formula based on SCr of 1.84 mg/dL (H)).   Medical History: Past Medical History:  Diagnosis Date  . Arthritis    "generalized; elbows; knees" (06/04/2017)  . CHF (congestive heart failure) (HCC) 05/26/2017  . Chronic lower back pain   . Essential hypertension   . Hyperlipidemia   . Obesity (BMI 30-39.9)   . Type 2 diabetes mellitus (HCC)     Assessment: [age] yoM s/p CABG with persistent post-op atrial fibrillation with HR in 100s. Heparin was initiated by TCTS overnight and now to continue per pharmacy. Heparin level slightly subtherapeutic at 0.27. Hgb down to 7.7 this morning, no overt S/Sx bleeding, transfused 1 unit.   Goal of Therapy:  Heparin level 0.3-0.7 units/ml Monitor platelets by anticoagulation protocol: Yes   Plan:  -Increase heparin to 900 units/hr -Check 8-hr heparin level -Monitor heparin level, CBC, S/Sx bleeding daily  Fredonia HighlandMichael Seann Genther, PharmD PGY-2 Cardiology Pharmacy Resident Pager: [phone] 06/22/2017

## 2017-06-22 NOTE — Progress Notes (Signed)
[facility] Rounding Note  Subjective:    Remains anuric on CRRT Weight down 130.1->101.5 Fluid removal goal changed to keep even On heparin for ischemic vs embolic lesions seen on CT For 1 unit PRBC's today   Required K replacement yesterday (3 runs) On IV phos replacement  Objective Vital signs in last 24 hours: Vitals:   06/22/17 0700 06/22/17 0800 06/22/17 0900 06/22/17 0906  BP: (!) 101/44 (!) 105/46 (!) 78/38 91/60  Pulse: (!) 122 (!) 123 (!) 120 (!) 120  Resp: (!) 21 (!) 24 (!) 21 (!) 27  Temp:  97.6 F (36.4 C)    TempSrc:  Oral    SpO2: 100% 100% 100% 100%  Weight:      Height:       Weight change: -3.7 kg (-8 lb 2.5 oz)  Intake/Output Summary (Last 24 hours) at 06/22/17 0925 Last data filed at 06/22/17 0900  Gross per 24 hour  Intake          2654.14 ml  Output             2574 ml  Net            80.14 ml   Physical Exam: CVP reported as 8-10 by RN, recorded as 17 Intubated, eyes open, responds only to pain, UE posturing movements noted ETT, R [place] TLC, L  IJ HD cath, OGT, flexiseal Regular, tachy at 120 Sternotomy scar healing/clean and dry.   Coarse BS but overall fairly clear Obese abdomen, not tender (that I can tell) Minimal if any edema now L great toe with some purplish discoloration/sl mottling HD Access- left IJ vascath placed 8/2 R [place] line (8/7)   Recent Labs Lab 06/21/17 0430 06/21/17 1633 06/22/17 0315  NA 136 138 138  K 3.6 3.4* 4.1  CL 98* 101 100*  CO2 25 25 30   GLUCOSE 224* 172* 239*  BUN 39* 39* 38*  CREATININE 1.80* 1.88* 1.84*  CALCIUM 8.8* 11.6* 9.1  PHOS 2.0* 3.4 2.4*    Recent Labs Lab 06/16/17 0350  06/18/17 0405  06/19/17 0419  06/21/17 0430 06/21/17 1633 06/22/17 0315  AST 37  --  56*  --  50*  --   --   --   --   ALT 17  --  19  --  22  --   --   --   --   ALKPHOS 147*  --  325*  --  299*  --   --   --   --   BILITOT 1.3*  --  1.1  --  1.1  --   --   --   --   PROT 5.2*  --  6.1*  --  6.2*  --   --   --   --    ALBUMIN 2.9*  < > 3.2*  < > 3.4*  3.4*  < > 4.4 4.5 4.2  < > = values in this interval not displayed.   Recent Labs Lab 06/18/17 0405 06/19/17 0419 06/20/17 0359 06/21/17 0430 06/22/17 0315  WBC 18.9* 17.5* 24.4* 31.7* 35.5*  HGB 8.2* 8.2* 8.3* 8.5* 7.7*  HCT 25.1* 25.4* 26.1* 26.8* 24.8*  MCV 86.6 88.8 90.0 89.3 90.2  PLT 194 205 234 258 259    Recent Labs Lab 06/21/17 0814 06/21/17 1210 06/21/17 1612 06/21/17 2037 06/21/17 2342  GLUCAP 194* 229* 167* 139* 189*    Studies/Results: Ct Head Wo Contrast  Result Date: 06/21/2017 CLINICAL DATA:  Initial evaluation  for decreased level of consciousness, subacute neuro deficit. EXAM: CT HEAD WITHOUT CONTRAST TECHNIQUE: Contiguous axial images were obtained from the base of the skull through the vertex without intravenous contrast. COMPARISON:  Prior CT from 06/18/2017. FINDINGS: Brain: Stable cerebral atrophy with chronic microvascular ischemic disease. There has been interval evolution of multiple patchy hypodensities involving the periventricular white matter of the bilateral corona radiata, right greater than left, now all slightly increased in size and more prominent as compared to previous exam (series 3, image 19). Patchy hypodensity involving the splenium also more prominent (series 3, image 18). Involvement of the periatrial white matter (series 3, image 16). Fairly symmetric hypodensity within the globus palladi (series 3, image 16). Involvement of the adjacent posterior limb of the left internal capsule. Hypodensity within the central right cerebellar hemisphere (series 3, image 7). Possible patchy involvement within the pons, somewhat age indeterminate. Superimposed small remote posterior left frontal infarct involving the precentral gyrus noted (series 3, image 24). Findings favored to reflect evolving ischemia, possibly embolic in nature given the various vascular distributions. Superimposed component of anoxia it may also be  present given the globus pallidus involvement. No acute intracranial hemorrhage. No other new acute large vessel territory infarct identified. No mass lesion or midline shift. No hydrocephalus. No extra-axial fluid collection. Vascular: No hyperdense vessel. Scattered vascular calcifications noted within the carotid siphons. Skull: Scalp soft tissues and calvarium within normal limits. Sinuses/Orbits: Globes and orbital soft tissues within normal limits. Patient status post lens extraction bilaterally. Air-fluid level present within the right sphenoid sinus. Paranasal sinuses are otherwise largely clear. Trace bilateral mastoid effusions. IMPRESSION: 1. Evolving patchy hypodensities involving the periventricular white matter, splenium, globus palladi, and right cerebellum, with additional possible patchy involvement within the pons. Findings favored to reflect evolving ischemia, possibly embolic in nature. Superimposed component of anoxic injury may be present as well given the globus pallidus involvement. 2. Otherwise stable underlying atrophy with chronic microvascular ischemic disease. Electronically Signed   By: Jeannine Boga M.D.   On: 06/21/2017 13:52   Dg Chest Port 1 View  Result Date: 06/22/2017 CLINICAL DATA:  Endotracheal leak intubated.  CABG 06/06/2017 EXAM: PORTABLE CHEST 1 VIEW COMPARISON:  One-view chest x-ray 05/20/2017 FINDINGS: The patient remains intubated. The endotracheal tube is in satisfactory position. Side port of the NG tube is in the stomach. Left IJ line and right subclavian line are stable. Aeration is slightly improved. Asymmetric left-sided interstitial and airspace disease remains, likely edema. Small effusions are stable. The right hemidiaphragm remains elevated. IMPRESSION: 1. Support apparatus unchanged. 2. Slight improved aeration with persistent asymmetric left-sided edema and bilateral pleural effusions. Electronically Signed   By: San Morelle M.D.   On:  06/22/2017 07:42   Medications: Infusions: . sodium chloride    . fluconazole (DIFLUCAN) IV 100 mg (06/22/17 0849)  . heparin 800 Units/hr (06/22/17 0700)  . lactated ringers    . lactated ringers Stopped (06/13/2017 0400)  . milrinone 0.25 mcg/kg/min (06/22/17 0700)  . norepinephrine (LEVOPHED) Adult infusion 19 mcg/min (06/22/17 0915)  . piperacillin-tazobactam (ZOSYN)  IV Stopped (06/22/17 0843)  . dialysis replacement fluid (prismasate) 300 mL/hr at 06/22/17 0556  . dialysis replacement fluid (prismasate) 300 mL/hr at 06/22/17 0556  . dialysate (PRISMASATE) 1,500 mL/hr at 06/22/17 0556  . propofol (DIPRIVAN) infusion Stopped (06/20/17 0830)  . sodium chloride    . sodium phosphate  Dextrose 5% IVPB 30 mmol (06/22/17 0905)  . vancomycin Stopped (06/21/17 1147)    Scheduled Medications: .  amiodarone  200 mg Per Tube Daily  . aspirin EC  325 mg Oral Daily   Or  . aspirin  324 mg Per Tube Daily  . atorvastatin  80 mg Per Tube q1800  . bisacodyl  10 mg Oral Daily   Or  . bisacodyl  10 mg Rectal Daily  . chlorhexidine gluconate (MEDLINE KIT)  15 mL Mouth Rinse BID  . Chlorhexidine Gluconate Cloth  6 each Topical q morning - 10a  . darbepoetin (ARANESP) injection - DIALYSIS  100 mcg Intravenous Q Mon-HD  . docusate  200 mg Oral Daily  . feeding supplement (NEPRO CARB STEADY)  1,000 mL Per Tube Q24H  . feeding supplement (PRO-STAT SUGAR FREE 64)  60 mL Per Tube QID  . insulin aspart  0-24 Units Subcutaneous Q4H  . insulin glargine  32 Units Subcutaneous BID  . levalbuterol  0.63 mg Nebulization Q6H  . mouth rinse  15 mL Mouth Rinse 10 times per day  . metoprolol tartrate  12.5 mg Per Tube BID   Or  . metoprolol tartrate  12.5 mg Per Tube BID  . pantoprazole sodium  40 mg Per Tube QHS  . sodium chloride flush  10-40 mL Intracatheter Q12H   Background 76 y.o. male PMH HTN, DM, OSA, chronic low back pain. Adm  7/25 with SOB/HF symptoms,   ^ trop., creatinine 1.4. Cath 7/27  (w/creatinine 1.96, improved to 1.1-1.2) ->3V ds. CABG 7/29 with progressive rise in creatinine post op, development of oliguria, failed extubation, marked volume overload. Renal was asked to see. Failed lasix challenge, CRRT initiated 8/2 for AKI/massive anasarca.    Assessment/Plan:   AKI on CKD3 2/2 ischemic ATN post CABG.  Persistent anuric AKI/no evidence return fx CRRT started  8/2.   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   MSSA nasal swab positive.  vanc/zosyn 8/7, fluconazole 8/10   Acute encephalopathy Remains unresponsive with posturing EEG 8/7 demonstrating moderate diffuse slowing of electrocerebral activity/CT 1/49 embolic vs ischemic lesions (+/- anoxic encephalopathy) IV heparin MRI when more stable Neuro following  Jamal Maes, MD [place] Pager 06/22/2017, 9:48 AM

## 2017-06-22 NOTE — Progress Notes (Signed)
STROKE TEAM PROGRESS NOTE   HISTORY OF PRESENT ILLNESS (Consult note Dr Leonel Ramsay 06/20/2017)  Philip Love is a 76 y.o. male with a history of multiple medical problems including diabetes who presented with 2 weeks progressive shortness of breath. He underwent CABG on 7/29 and was recovering and extubated on 7/30. He did have an episode on 7/31 with long pauses and altered mental status after being bolused with amiodarone. He was intubated emergently at that time. He was sedated following this, but sedation was turned off on 8/6 and he has not significantly improved. Overnight, it was noted that he was posturing bilaterally and therefore neurology has been consulted.   LKW: Unclear tpa given?: no, recent surgery    SUBJECTIVE (INTERVAL HISTORY) Nurse (bedside) indicates no change in neurological status.  No family present at bedside.  ROS: Review of Systems  Unable to perform ROS: Critical illness    OBJECTIVE Temp:  [97.5 F (36.4 C)-98.7 F (37.1 C)] 97.6 F (36.4 C) (08/11 1556) Pulse Rate:  [105-129] 125 (08/11 1615) Cardiac Rhythm: Atrial fibrillation (08/11 0800) Resp:  [20-32] 26 (08/11 1615) BP: (76-108)/(37-73) 102/43 (08/11 1600) SpO2:  [93 %-100 %] 100 % (08/11 1615) FiO2 (%):  [40 %] 40 % (08/11 1600) Weight:  [223 lb 12.3 oz (101.5 kg)] 223 lb 12.3 oz (101.5 kg) (08/11 0300)    MEDICATIONS . amiodarone  200 mg Per Tube Daily  . aspirin EC  325 mg Oral Daily   Or  . aspirin  324 mg Per Tube Daily  . atorvastatin  80 mg Per Tube q1800  . chlorhexidine gluconate (MEDLINE KIT)  15 mL Mouth Rinse BID  . Chlorhexidine Gluconate Cloth  6 each Topical q morning - 10a  . darbepoetin (ARANESP) injection - DIALYSIS  100 mcg Intravenous Q Mon-HD  . feeding supplement (NEPRO CARB STEADY)  1,000 mL Per Tube Q24H  . feeding supplement (PRO-STAT SUGAR FREE 64)  60 mL Per Tube QID  . insulin aspart  0-24 Units Subcutaneous Q4H  . insulin glargine  40 Units Subcutaneous  BID  . levalbuterol  0.63 mg Nebulization Q6H  . mouth rinse  15 mL Mouth Rinse 10 times per day  . pantoprazole sodium  40 mg Per Tube QHS  . sodium chloride flush  10-40 mL Intracatheter Q12H   . fluconazole (DIFLUCAN) IV Stopped (06/22/17 0949)  . heparin 900 Units/hr (06/22/17 1500)  . lactated ringers    . lactated ringers Stopped (06/21/2017 0400)  . milrinone 0.25 mcg/kg/min (06/22/17 1400)  . norepinephrine (LEVOPHED) Adult infusion 20 mcg/min (06/22/17 1400)  . piperacillin-tazobactam (ZOSYN)  IV Stopped (06/22/17 1543)  . dialysis replacement fluid (prismasate) 300 mL/hr at 06/22/17 1357  . dialysis replacement fluid (prismasate) 300 mL/hr at 06/22/17 1031  . dialysate (PRISMASATE) 1,500 mL/hr at 06/22/17 1342  . propofol (DIPRIVAN) infusion Stopped (06/20/17 0830)  . sodium chloride    . sodium phosphate  Dextrose 5% IVPB Stopped (06/22/17 1505)  . vancomycin Stopped (06/22/17 1439)   acetaminophen, heparin, ondansetron (ZOFRAN) IV, sodium chloride, sodium chloride flush    CBC:   Recent Labs Lab 06/21/17 0430 06/22/17 0315  WBC 31.7* 35.5*  HGB 8.5* 7.7*  HCT 26.8* 24.8*  MCV 89.3 90.2  PLT 258 546    Basic Metabolic Panel:   Recent Labs Lab 06/21/17 0430 06/21/17 1633 06/22/17 0315  NA 136 138 138  K 3.6 3.4* 4.1  CL 98* 101 100*  CO2 25 25 30   GLUCOSE 224* 172*  239*  BUN 39* 39* 38*  CREATININE 1.80* 1.88* 1.84*  CALCIUM 8.8* 11.6* 9.1  MG 2.8*  --  2.6*  PHOS 2.0* 3.4 2.4*    Lipid Panel:     Component Value Date/Time   CHOL 125 06/08/2017 0725   TRIG 138 06/20/2017 0359   HDL 34 (L) 06/08/2017 0725   CHOLHDL 3.7 06/08/2017 0725   VLDL 35 06/08/2017 0725   LDLCALC 56 06/08/2017 0725   HgbA1c:  Lab Results  Component Value Date   HGBA1C 6.3 (H) 06/08/2017   Urine Drug Screen: No results found for: LABOPIA, COCAINSCRNUR, LABBENZ, AMPHETMU, THCU, LABBARB  Alcohol Level No results found for: ETH  IMAGING  Ct Head Wo  Contrast 06/21/2017 1. Evolving patchy hypodensities involving the periventricular white matter, splenium, globus palladi, and right cerebellum, with additional possible patchy involvement within the pons. Findings favored to reflect evolving ischemia, possibly embolic in nature. Superimposed component of anoxic injury may be present as well given the globus pallidus involvement.  2. Otherwise stable underlying atrophy with chronic microvascular ischemic disease.        Dg Chest Port 1 View 06/22/2017 1. Support apparatus unchanged.  2. Slight improved aeration with persistent asymmetric left-sided edema and bilateral pleural effusions.   Transthoracic echocardiogram 06/20/2017 Study Conclusions - Left ventricle: The cavity size was normal. There was moderate   concentric hypertrophy. Systolic function was normal. The   estimated ejection fraction was in the range of 60% to 65%. Wall   motion was normal; there were no regional wall motion   abnormalities. The study was not technically sufficient to allow   evaluation of LV diastolic dysfunction due to atrial fibrillation. - Aortic valve: There was mild stenosis. Mean gradient (S): 12 mm   Hg. Peak gradient (S): 22 mm Hg. Valve area (VTI): 1.82 cm^2.   Valve area (Vmax): 1.49 cm^2. Valve area (Vmean): 1.56 cm^2. - Mitral valve: Structurally normal valve. - Right ventricle: The cavity size was mildly dilated. Wall   thickness was normal. Systolic function was mildly reduced. - Right atrium: The atrium was normal in size. - Tricuspid valve: There was no regurgitation. - Pulmonic valve: There was no regurgitation. - Inferior vena cava: The vessel was normal in size. - Pericardium, extracardiac: There was no pericardial effusion.   PHYSICAL EXAM  Gen: In bed, NAD/ intubated Resp: non-labored breathing- + Gag to suction Abd: soft, nt  Neuro: MS: eyes partially open at rest, does not follow commands, he grimaces and moves his head  in response to noxious stimuli.  HT:DSKAJ, eyes dysconjugate, corneals intact, cough intact. No Dolls Motor: Decorticate type posturing of UE's to pain- does not localize Sensory:he does respond to pain x 4    ASSESSMENT/PLAN Philip Love is a 76 y.o. male with history of diabetes mellitus, obesity, hyperlipidemia, hypertension, congestive heart failure, and coronary artery disease with recent CABG on 06/03/2017 presenting with altered mental status and posturing. He did not receive IV t-PA due to recent surgery.  Strokes:  Embolic - secondary to atrial fibrillation  Resultant;  Comatose- no purposeful activity- only posturing  CT head - Evolving patchy hypodensities involving the periventricular white matter, splenium, globus palladi, and right cerebellum, with additional possible patchy involvement within the pons. Findings favored to reflect evolving ischemia, possibly embolic in nature. Superimposed component of anoxic injury may be present as well given the globus pallidus involvement.  MRI head - not performed (pending CV surgery permission)  MRA head - not performed  Carotid Doppler - Right: mild mixed plaque throughout CCA and proximal ICA and ECA. 1-39% ICA stenosis. Left: mild mixed plaque CCA. Moderate to severe mixed plaque origin and proximal ICA and ECA. 40-59% ICA stenosis, highest end of scale. Bilateral vertebral artery flow is antegrade.  2D Echo - EF 60-65%. No cardiac source of emboli identified. Atrial fibrillation noted.  LDL - pending  HgbA1c - pending  VTE prophylaxis - IV heparin Diet NPO time specified  aspirin 81 mg daily prior to admission, now on aspirin 325 mg daily and heparin IV  Patient counseled to be compliant with his antithrombotic medications  Ongoing aggressive stroke risk factor management  Therapy recommendations: pending  Disposition:  pending  Hypertension  Occasional low blood pressures  Long-term BP goal  normotensive  Hyperlipidemia  Home meds:  Zocor 20 mg daily PTA not resumed in hospital  LDL pending, goal < 70  Now on Lipitor 80 mg daily  Continue statin at discharge  Diabetes  HgbA1c pending, goal < 7.0  Unc / Controlled  Other Stroke Risk Factors  Advanced age  Former cigarette smoker - quit greater than 40 years ago.  Obesity, Body mass index is 34.02 kg/m., recommend weight loss, diet and exercise as appropriate   Coronary artery disease  Atrial Fibrillation  Other Active Problems  Postop anemia  Renal - 38 / 1.84  Electrolyte abnormalities  Atrial fibrillation - new diagnosis   Leukocytosis (WBC 35 K)- ? Etiology- consider ID eval/ ? LP  Hospital day # 17  I have personally examined this patient, reviewed notes, independently viewed imaging studies, participated in medical decision making and plan of care. ROS unable to be completed due to comatose state. Pt remains comatose, with only decorticate type posturing with stimulation.  Does not exhibit any definite purposeful activity.  Prognostication difficult at this time given ongoing uncertainty of his presentation (? Anoxia, ? Embolic shower).  Await ability to perform Head MRI.  Prior EEG only with slowing. Will continue to follow clinically, however suboptimal neurological exam while on ventilator/ sedation (propofol).  This patient is critically ill and at significant risk of neurological worsening, death and care requires constant monitoring of vital signs, hemodynamics,respiratory and cardiac monitoring, extensive review of multiple databases, frequent neurological assessment, discussion with family, other specialists and medical decision making of high complexity. I have made any additions or clarifications directly to the above note.This critical care time does not reflect procedure time, or teaching time or supervisory time of PA/NP/Med Resident etc but could involve care discussion time.  I spent  30 minutes of neurocritical care time  in the care of  this patient.  To contact Stroke Continuity provider, please refer to [email or web address] After hours, contact General Neurology

## 2017-06-22 NOTE — Progress Notes (Signed)
ANTICOAGULATION CONSULT NOTE Pharmacy Consult for heparin Indication: atrial fibrillation  Allergies  Allergen Reactions  . Bee Venom Swelling    Patient Measurements: Height: 5\' 8"  (172.7 cm) Weight: 223 lb 12.3 oz (101.5 kg) IBW/kg (Calculated) : 68.4 Heparin Dosing Weight: 89 kg  Vital Signs: Temp: 99.2 F (37.3 C) (08/11 2026) Temp Source: Oral (08/11 2026) BP: 96/34 (08/11 2200) Pulse Rate: 88 (08/11 2200)  Labs:  Recent Labs  06/20/17 0359  06/21/17 0430 06/21/17 1633 06/22/17 0315 06/22/17 1152 06/22/17 1555 06/22/17 2111  HGB 8.3*  --  8.5*  --  7.7*  --   --   --   HCT 26.1*  --  26.8*  --  24.8*  --   --   --   PLT 234  --  258  --  259  --   --   --   HEPARINUNFRC  --   --   --   --   --  0.27*  --  0.32  CREATININE 1.84*  < > 1.80* 1.88* 1.84*  --  1.62*  --   < > = values in this interval not displayed.  Estimated Creatinine Clearance: 46.9 mL/min (A) (by C-G formula based on SCr of 1.62 mg/dL (H)).     Assessment: [age] yoM s/p CABG with persistent post-op atrial fibrillation with HR in 100s. Heparin was initiated by TCTS overnight and now to continue per pharmacy.  Heparin level therapeutic  Goal of Therapy:  Heparin level 0.3-0.7 units/ml Monitor platelets by anticoagulation protocol: Yes   Plan:  Continue heparin at 900 units / hr Follow up AM labs  Thank you Okey RegalLisa Tomasz Steeves, PharmD [phone] 06/22/2017

## 2017-06-22 NOTE — Progress Notes (Signed)
13 Days Post-Op Procedure(s) (LRB): CORONARY ARTERY BYPASS GRAFTING (CABG) x3 WITH TEE USING LEFT INTERNAL MAMMARY ARTERY AND RIGHT  SAPHENOUS LEG VEIN HARVESTED ENDOSCOPICALLY (N/A) Subjective: Intubated, unresponsive  Objective: Vital signs in last 24 hours: Temp:  [97.5 F (36.4 C)-98.7 F (37.1 C)] 97.5 F (36.4 C) (08/11 1100) Pulse Rate:  [84-130] 108 (08/11 1200) Cardiac Rhythm: Atrial fibrillation (08/11 0800) Resp:  [20-32] 25 (08/11 1200) BP: (78-112)/(37-73) 90/52 (08/11 1200) SpO2:  [95 %-100 %] 100 % (08/11 1200) FiO2 (%):  [40 %] 40 % (08/11 1100) Weight:  [101.5 kg (223 lb 12.3 oz)] 101.5 kg (223 lb 12.3 oz) (08/11 0300)  Hemodynamic parameters for last 24 hours: CVP:  [0 mmHg-13 mmHg] 8 mmHg  Intake/Output from previous day: 08/10 0701 - 08/11 0700 In: 3003.6 [I.V.:815.6; NG/GT:1320; IV Piggyback:858] Out: 2969 [Urine:30; Stool:300] Intake/Output this shift: Total I/O In: 609.3 [I.V.:170.8; NG/GT:290; IV Piggyback:148.5] Out: 256 [Other:256]  General appearance: remains on vent Neurologic: unresponsive with some posturing movements. Heart: regular rate and rhythm, S1, S2 normal, no murmur, click, rub or gallop Lungs: coarse Abdomen: soft, non-tender; bowel sounds normal; no masses,  no organomegaly Extremities: edema mild Wound: incisions ok  Lab Results:  Recent Labs  06/21/17 0430 06/22/17 0315  WBC 31.7* 35.5*  HGB 8.5* 7.7*  HCT 26.8* 24.8*  PLT 258 259   BMET:  Recent Labs  06/21/17 1633 06/22/17 0315  NA 138 138  K 3.4* 4.1  CL 101 100*  CO2 25 30  GLUCOSE 172* 239*  BUN 39* 38*  CREATININE 1.88* 1.84*  CALCIUM 11.6* 9.1    PT/INR: No results for input(s): LABPROT, INR in the last 72 hours. ABG    Component Value Date/Time   PHART 7.393 07/12/2017 0333   HCO3 25.3 07/01/2017 0333   TCO2 27 07/08/2017 0333   ACIDBASEDEF 1.0 06/16/2017 0348   O2SAT 52.9 06/22/2017 0316   CBG (last 3)   Recent Labs  06/21/17 2037  06/21/17 2342 06/22/17 1121  GLUCAP 139* 189* 219*   CLINICAL DATA:  Endotracheal leak intubated.  CABG 06/04/2017  EXAM: PORTABLE CHEST 1 VIEW  COMPARISON:  One-view chest x-ray 05/20/2017  FINDINGS: The patient remains intubated. The endotracheal tube is in satisfactory position. Side port of the NG tube is in the stomach. Left IJ line and right subclavian line are stable.  Aeration is slightly improved. Asymmetric left-sided interstitial and airspace disease remains, likely edema. Small effusions are stable. The right hemidiaphragm remains elevated.  IMPRESSION: 1. Support apparatus unchanged. 2. Slight improved aeration with persistent asymmetric left-sided edema and bilateral pleural effusions.   Electronically Signed   By: Marin Robertshristopher  Mattern M.D.   On: 06/22/2017 07:42  Assessment/Plan: S/P Procedure(s) (LRB): CORONARY ARTERY BYPASS GRAFTING (CABG) x3 WITH TEE USING LEFT INTERNAL MAMMARY ARTERY AND RIGHT  SAPHENOUS LEG VEIN HARVESTED ENDOSCOPICALLY (N/A)  Vasodilated requiring 19 mcg of levophed in addition to milrinone 0.25. Co-ox is 53% but Hgb is only 7.7. LVEF is normal. CVP low.  Remains in atrial fib low 100's on amio per tube. Heparin started.  Increasing leukocytosis concerning for infection of undetermined origin. CXR looks ok. Incision ok. All cultures negative so far. Continues on vanc, Zosyn and Diflucan. Central line is new.  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  Anemia with drop in Hgb to 7.7. No visible blood loss. Transfused one unit this am.  Discussed status at bedside with wife.    LOS: 17 days  Alleen Borne 06/22/2017

## 2017-06-22 NOTE — Progress Notes (Signed)
Patient ID: Philip Love, male   DOB: 1941-11-09, 76 y.o.   MRN: [id number] SICU Evening Rounds:  He remains on milrinone and levophed 20 mcg  Rhythm looks like atrial flutter 130. Will put back on IV amio. Hopefully slowing him down will improve his BP.  Remains unresponsive on vent with some posturing movements.  CRRT running.

## 2017-06-23 ENCOUNTER — Inpatient Hospital Stay (HOSPITAL_COMMUNITY): Payer: Medicare Other

## 2017-06-23 LAB — CULTURE, BLOOD (ROUTINE X 2)
Culture: NO GROWTH
Culture: NO GROWTH
Special Requests: ADEQUATE
Special Requests: ADEQUATE

## 2017-06-23 LAB — GLUCOSE, CAPILLARY
GLUCOSE-CAPILLARY: 254 mg/dL — AB (ref 65–99)
GLUCOSE-CAPILLARY: 255 mg/dL — AB (ref 65–99)
GLUCOSE-CAPILLARY: 288 mg/dL — AB (ref 65–99)
Glucose-Capillary: 224 mg/dL — ABNORMAL HIGH (ref 65–99)
Glucose-Capillary: 181 mg/dL — ABNORMAL HIGH (ref 65–99)

## 2017-06-23 LAB — RENAL FUNCTION PANEL
ALBUMIN: 3.8 g/dL (ref 3.5–5.0)
Albumin: 3.5 g/dL (ref 3.5–5.0)
Anion gap: 11 (ref 5–15)
Anion gap: 14 (ref 5–15)
BUN: 43 mg/dL — AB (ref 6–20)
BUN: 41 mg/dL — ABNORMAL HIGH (ref 6–20)
CALCIUM: 8.6 mg/dL — AB (ref 8.9–10.3)
CO2: 26 mmol/L (ref 22–32)
CO2: 22 mmol/L (ref 22–32)
Calcium: 8.5 mg/dL — ABNORMAL LOW (ref 8.9–10.3)
Chloride: 99 mmol/L — ABNORMAL LOW (ref 101–111)
Chloride: 99 mmol/L — ABNORMAL LOW (ref 101–111)
Creatinine, Ser: 1.75 mg/dL — ABNORMAL HIGH (ref 0.61–1.24)
Creatinine, Ser: 1.83 mg/dL — ABNORMAL HIGH (ref 0.61–1.24)
GFR calc Af Amer: 43 mL/min — ABNORMAL LOW (ref >60)
GFR calc Af Amer: 41 mL/min — ABNORMAL LOW (ref >60)
GFR calc non Af Amer: 35 mL/min — ABNORMAL LOW (ref >60)
GFR, EST NON AFRICAN AMERICAN: 37 mL/min — AB (ref >60)
Glucose, Bld: 294 mg/dL — ABNORMAL HIGH (ref 65–99)
Glucose, Bld: 339 mg/dL — ABNORMAL HIGH (ref 65–99)
PHOSPHORUS: 2.2 mg/dL — AB (ref 2.5–4.6)
POTASSIUM: 4.4 mmol/L (ref 3.5–5.1)
Phosphorus: 3.8 mg/dL (ref 2.5–4.6)
Potassium: 4.4 mmol/L (ref 3.5–5.1)
SODIUM: 136 mmol/L (ref 135–145)
Sodium: 135 mmol/L (ref 135–145)

## 2017-06-23 LAB — TYPE AND SCREEN
ABO/RH(D): O POS
Antibody Screen: NEGATIVE
Unit division: 0

## 2017-06-23 LAB — CBC
HCT: 26.3 % — ABNORMAL LOW (ref 39.0–52.0)
Hemoglobin: 8.4 g/dL — ABNORMAL LOW (ref 13.0–17.0)
MCH: 28.3 pg (ref 26.0–34.0)
MCHC: 31.9 g/dL (ref 30.0–36.0)
MCV: 88.6 fL (ref 78.0–100.0)
Platelets: 259 10*3/uL (ref 150–400)
RBC: 2.97 MIL/uL — ABNORMAL LOW (ref 4.22–5.81)
RDW: 17.6 % — ABNORMAL HIGH (ref 11.5–15.5)
WBC: 35.6 10*3/uL — ABNORMAL HIGH (ref 4.0–10.5)

## 2017-06-23 LAB — BPAM RBC
Blood Product Expiration Date: 201809012359
ISSUE DATE / TIME: 201808110947
Unit Type and Rh: 5100

## 2017-06-23 LAB — PHOSPHORUS: Phosphorus: 2.2 mg/dL — ABNORMAL LOW (ref 2.5–4.6)

## 2017-06-23 LAB — HEPARIN LEVEL (UNFRACTIONATED): Heparin Unfractionated: 0.34 IU/mL (ref 0.30–0.70)

## 2017-06-23 LAB — LIPID PANEL
CHOL/HDL RATIO: 4 ratio
CHOLESTEROL: 48 mg/dL (ref 0–200)
HDL: 12 mg/dL — ABNORMAL LOW (ref >40)
LDL Cholesterol: 3 mg/dL (ref 0–99)
Triglycerides: 165 mg/dL — ABNORMAL HIGH (ref <150)
VLDL: 33 mg/dL (ref 0–40)

## 2017-06-23 LAB — COOXEMETRY PANEL
Carboxyhemoglobin: 1 % (ref 0.5–1.5)
Methemoglobin: 1.4 % (ref 0.0–1.5)
O2 Saturation: 58.5 %
Total hemoglobin: 8.4 g/dL — ABNORMAL LOW (ref 12.0–16.0)

## 2017-06-23 LAB — MAGNESIUM: MAGNESIUM: 2.5 mg/dL — AB (ref 1.7–2.4)

## 2017-06-23 MED ORDER — ALBUMIN HUMAN 5 % IV SOLN
12.5000 g | Freq: Once | INTRAVENOUS | Status: AC
  Administered 2017-06-23: 12.5 g via INTRAVENOUS

## 2017-06-23 MED ORDER — INSULIN GLARGINE 100 UNIT/ML ~~LOC~~ SOLN
42.0000 [IU] | Freq: Two times a day (BID) | SUBCUTANEOUS | Status: DC
  Administered 2017-06-23: 42 [IU] via SUBCUTANEOUS
  Filled 2017-06-23: qty 0.42

## 2017-06-23 MED ORDER — PIPERACILLIN-TAZOBACTAM 3.375 G IVPB 30 MIN
3.3750 g | Freq: Four times a day (QID) | INTRAVENOUS | Status: DC
  Administered 2017-06-23 – 2017-06-24 (×3): 3.375 g via INTRAVENOUS
  Filled 2017-06-23 (×6): qty 50

## 2017-06-23 MED ORDER — INSULIN GLARGINE 100 UNIT/ML ~~LOC~~ SOLN
50.0000 [IU] | Freq: Two times a day (BID) | SUBCUTANEOUS | Status: DC
  Administered 2017-06-23: 50 [IU] via SUBCUTANEOUS
  Administered 2017-06-24: 25 [IU] via SUBCUTANEOUS
  Filled 2017-06-23 (×4): qty 0.5

## 2017-06-23 MED ORDER — DARBEPOETIN ALFA 200 MCG/0.4ML IJ SOSY
200.0000 ug | PREFILLED_SYRINGE | INTRAMUSCULAR | Status: DC
  Administered 2017-06-24: 200 ug via INTRAVENOUS
  Filled 2017-06-23 (×3): qty 0.4

## 2017-06-23 MED ORDER — FENTANYL CITRATE (PF) 100 MCG/2ML IJ SOLN
25.0000 ug | INTRAMUSCULAR | Status: DC | PRN
  Administered 2017-06-23: 50 ug via INTRAVENOUS
  Filled 2017-06-23: qty 2

## 2017-06-23 MED ORDER — SODIUM CHLORIDE 0.9 % IV SOLN
Freq: Once | INTRAVENOUS | Status: AC
  Administered 2017-06-24: 01:00:00 via INTRAVENOUS

## 2017-06-23 MED ORDER — ALBUMIN HUMAN 5 % IV SOLN
INTRAVENOUS | Status: AC
  Filled 2017-06-23: qty 250

## 2017-06-23 MED ORDER — FENTANYL CITRATE (PF) 100 MCG/2ML IJ SOLN: 50.0000 ug | INTRAMUSCULAR | Status: DC | PRN

## 2017-06-23 NOTE — Progress Notes (Signed)
STROKE TEAM PROGRESS NOTE  Interim Note  Reviewed chart, no change in exam. Pending MRI head for further recommendations.   ROS :Review of Systems  Unable to perform ROS: Critical illness     MEDICATIONS . aspirin EC  325 mg Oral Daily   Or  . aspirin  324 mg Per Tube Daily  . atorvastatin  80 mg Per Tube q1800  . chlorhexidine gluconate (MEDLINE KIT)  15 mL Mouth Rinse BID  . Chlorhexidine Gluconate Cloth  6 each Topical q morning - 10a  . darbepoetin (ARANESP) injection - DIALYSIS  100 mcg Intravenous Q Mon-HD  . feeding supplement (NEPRO CARB STEADY)  1,000 mL Per Tube Q24H  . feeding supplement (PRO-STAT SUGAR FREE 64)  60 mL Per Tube QID  . insulin aspart  0-24 Units Subcutaneous Q4H  . insulin glargine  40 Units Subcutaneous BID  . levalbuterol  0.63 mg Nebulization Q6H  . mouth rinse  15 mL Mouth Rinse 10 times per day  . pantoprazole sodium  40 mg Per Tube QHS  . sodium chloride flush  10-40 mL Intracatheter Q12H   . amiodarone 30 mg/hr (06/23/17 0700)  . fluconazole (DIFLUCAN) IV Stopped (06/22/17 0949)  . heparin 900 Units/hr (06/23/17 0700)  . lactated ringers    . lactated ringers Stopped (06/26/2017 0400)  . milrinone 0.25 mcg/kg/min (06/23/17 0700)  . norepinephrine (LEVOPHED) Adult infusion 24 mcg/min (06/23/17 0700)  . piperacillin-tazobactam (ZOSYN)  IV 3.375 g (06/23/17 0516)  . dialysis replacement fluid (prismasate) 300 mL/hr at 06/22/17 2337  . dialysis replacement fluid (prismasate) 300 mL/hr at 06/22/17 2337  . dialysate (PRISMASATE) 1,500 mL/hr at 06/23/17 0706  . propofol (DIPRIVAN) infusion Stopped (06/20/17 0830)  . sodium chloride    . sodium phosphate  Dextrose 5% IVPB Stopped (06/22/17 1505)  . vancomycin Stopped (06/22/17 1439)   acetaminophen, heparin, ondansetron (ZOFRAN) IV, sodium chloride, sodium chloride flush    CBC:   Recent Labs Lab 06/22/17 0315 06/23/17 0258  WBC 35.5* 35.6*  HGB 7.7* 8.4*  HCT 24.8* 26.3*  MCV 90.2  88.6  PLT 259 825    Basic Metabolic Panel:   Recent Labs Lab 06/22/17 0315 06/22/17 1555 06/23/17 0258  NA 138 136 136  K 4.1 3.4* 4.4  CL 100* 101 99*  CO2 30 21* 26  GLUCOSE 239* 192* 294*  BUN 38* 36* 43*  CREATININE 1.84* 1.62* 1.75*  CALCIUM 9.1 8.6* 8.6*  MG 2.6*  --  2.5*  PHOS 2.4* 3.1 2.2*  2.2*    Lipid Panel:     Component Value Date/Time   CHOL 48 06/23/2017 0258   TRIG 165 (H) 06/23/2017 0258   HDL 12 (L) 06/23/2017 0258   CHOLHDL 4.0 06/23/2017 0258   VLDL 33 06/23/2017 0258   LDLCALC 3 06/23/2017 0258   HgbA1c:  Lab Results  Component Value Date   HGBA1C 6.3 (H) 06/08/2017   Urine Drug Screen: No results found for: LABOPIA, COCAINSCRNUR, LABBENZ, AMPHETMU, THCU, LABBARB  Alcohol Level No results found for: [facility]

## 2017-06-23 NOTE — Progress Notes (Signed)
[facility] Rounding Note  Subjective:    Remains anuric on CRRT (started 8/2) Weight down 130.1->101.5->103 (since "keep even order" yesterday) On heparin for ischemic vs embolic lesions seen on CT Amio for AFF Transfused 1 unit yesterday   Objective Vital signs in last 24 hours: Vitals:   06/23/17 0630 06/23/17 0700 06/23/17 0730 06/23/17 0805  BP: (!) 100/44 (!) 103/44 (!) 105/49   Pulse: (!) 123 (!) 124 (!) 123   Resp: (!) 24 (!) 25 (!) 26   Temp:   98.6 F (37 C)   TempSrc:   Axillary   SpO2: 95% 97% 96% 100%  Weight:      Height:       Weight change: 1.5 kg (3 lb 4.9 oz)  Intake/Output Summary (Last 24 hours) at 06/23/17 0920 Last data filed at 06/23/17 0800  Gross per 24 hour  Intake          3024.38 ml  Output             2147 ml  Net           877.38 ml   Physical Exam: CVP reported as 8-10 by RN, recorded as 17 Intubated ETT, R [place] TLC, L  IJ HD cath, OGT, flexiseal Regular, tachy at 120 Sternotomy scar healing/clean and dry.   Coarse BS but overall fairly clear Obese abdomen, not tender  Minimal if any edema now Feet cool L great toe with some purplish discoloration/sl mottling unchanged HD Access- left IJ vascath placed 8/2 R [place] line (8/7) Neurologically moving around more on the bed, hands mittened but at times looks like trying to remove the mittens. Does not follow commands   Recent Labs Lab 06/22/17 0315 06/22/17 1555 06/23/17 0258  NA 138 136 136  K 4.1 3.4* 4.4  CL 100* 101 99*  CO2 30 21* 26  GLUCOSE 239* 192* 294*  BUN 38* 36* 43*  CREATININE 1.84* 1.62* 1.75*  CALCIUM 9.1 8.6* 8.6*  PHOS 2.4* 3.1 2.2*  2.2*    Recent Labs Lab 06/18/17 0405  06/19/17 0419  06/22/17 0315 06/22/17 1555 06/23/17 0258  AST 56*  --  50*  --   --   --   --   ALT 19  --  22  --   --   --   --   ALKPHOS 325*  --  299*  --   --   --   --   BILITOT 1.1  --  1.1  --   --   --   --   PROT 6.1*  --  6.2*  --   --   --   --   ALBUMIN 3.2*  < > 3.4*  3.4*  <  > 4.2 3.7 3.8  < > = values in this interval not displayed.   Recent Labs Lab 06/19/17 0419 06/20/17 0359 06/21/17 0430 06/22/17 0315 06/23/17 0258  WBC 17.5* 24.4* 31.7* 35.5* 35.6*  HGB 8.2* 8.3* 8.5* 7.7* 8.4*  HCT 25.4* 26.1* 26.8* 24.8* 26.3*  MCV 88.8 90.0 89.3 90.2 88.6  PLT 205 234 258 259 259    Recent Labs Lab 06/22/17 1555 06/22/17 2023 06/22/17 2343 06/23/17 0345 06/23/17 0752  GLUCAP 198* 122* 255* 288* 224*    Studies/Results: Ct Head Wo Contrast  Result Date: 06/21/2017 CLINICAL DATA:  Initial evaluation for decreased level of consciousness, subacute neuro deficit. EXAM: CT HEAD WITHOUT CONTRAST TECHNIQUE: Contiguous axial images were obtained from the base of the  skull through the vertex without intravenous contrast. COMPARISON:  Prior CT from 06/18/2017. FINDINGS: Brain: Stable cerebral atrophy with chronic microvascular ischemic disease. There has been interval evolution of multiple patchy hypodensities involving the periventricular white matter of the bilateral corona radiata, right greater than left, now all slightly increased in size and more prominent as compared to previous exam (series 3, image 19). Patchy hypodensity involving the splenium also more prominent (series 3, image 18). Involvement of the periatrial white matter (series 3, image 16). Fairly symmetric hypodensity within the globus palladi (series 3, image 16). Involvement of the adjacent posterior limb of the left internal capsule. Hypodensity within the central right cerebellar hemisphere (series 3, image 7). Possible patchy involvement within the pons, somewhat age indeterminate. Superimposed small remote posterior left frontal infarct involving the precentral gyrus noted (series 3, image 24). Findings favored to reflect evolving ischemia, possibly embolic in nature given the various vascular distributions. Superimposed component of anoxia it may also be present given the globus pallidus  involvement. No acute intracranial hemorrhage. No other new acute large vessel territory infarct identified. No mass lesion or midline shift. No hydrocephalus. No extra-axial fluid collection. Vascular: No hyperdense vessel. Scattered vascular calcifications noted within the carotid siphons. Skull: Scalp soft tissues and calvarium within normal limits. Sinuses/Orbits: Globes and orbital soft tissues within normal limits. Patient status post lens extraction bilaterally. Air-fluid level present within the right sphenoid sinus. Paranasal sinuses are otherwise largely clear. Trace bilateral mastoid effusions. IMPRESSION: 1. Evolving patchy hypodensities involving the periventricular white matter, splenium, globus palladi, and right cerebellum, with additional possible patchy involvement within the pons. Findings favored to reflect evolving ischemia, possibly embolic in nature. Superimposed component of anoxic injury may be present as well given the globus pallidus involvement. 2. Otherwise stable underlying atrophy with chronic microvascular ischemic disease. Electronically Signed   By: Jeannine Boga M.D.   On: 06/21/2017 13:52   Dg Chest Port 1 View  Result Date: 06/23/2017 CLINICAL DATA:  Acute respiratory failure EXAM: PORTABLE CHEST 1 VIEW COMPARISON:  06/22/2017 FINDINGS: Cardiac shadow is mildly enlarged. Postsurgical changes are again seen. A you right subclavian central line as well as left jugular central line are again seen and stable. Endotracheal tube and nasogastric catheter are noted and stable. The lungs are well aerated with persistent left-sided asymmetric edema and right basilar atelectasis. No new focal abnormality is noted. IMPRESSION: No change from the prior exam. Electronically Signed   By: Inez Catalina M.D.   On: 06/23/2017 07:24   Dg Chest Port 1 View  Result Date: 06/22/2017 CLINICAL DATA:  Endotracheal leak intubated.  CABG 05/29/2017 EXAM: PORTABLE CHEST 1 VIEW COMPARISON:   One-view chest x-ray 05/20/2017 FINDINGS: The patient remains intubated. The endotracheal tube is in satisfactory position. Side port of the NG tube is in the stomach. Left IJ line and right subclavian line are stable. Aeration is slightly improved. Asymmetric left-sided interstitial and airspace disease remains, likely edema. Small effusions are stable. The right hemidiaphragm remains elevated. IMPRESSION: 1. Support apparatus unchanged. 2. Slight improved aeration with persistent asymmetric left-sided edema and bilateral pleural effusions. Electronically Signed   By: San Morelle M.D.   On: 06/22/2017 07:42   Medications: Infusions: . amiodarone 30 mg/hr (06/23/17 0800)  . fluconazole (DIFLUCAN) IV Stopped (06/22/17 0949)  . heparin 900 Units/hr (06/23/17 0700)  . lactated ringers    . lactated ringers Stopped (06/19/2017 0400)  . milrinone 0.25 mcg/kg/min (06/23/17 0800)  . norepinephrine (LEVOPHED) Adult infusion 26 mcg/min (  06/23/17 0830)  . piperacillin-tazobactam (ZOSYN)  IV 3.375 g (06/23/17 0516)  . dialysis replacement fluid (prismasate) 300 mL/hr at 06/22/17 2337  . dialysis replacement fluid (prismasate) 300 mL/hr at 06/22/17 2337  . dialysate (PRISMASATE) 1,500 mL/hr at 06/23/17 0706  . propofol (DIPRIVAN) infusion Stopped (06/20/17 0830)  . sodium chloride    . sodium phosphate  Dextrose 5% IVPB Stopped (06/22/17 1505)  . vancomycin Stopped (06/22/17 1439)    Scheduled Medications: . aspirin EC  325 mg Oral Daily   Or  . aspirin  324 mg Per Tube Daily  . atorvastatin  80 mg Per Tube q1800  . chlorhexidine gluconate (MEDLINE KIT)  15 mL Mouth Rinse BID  . Chlorhexidine Gluconate Cloth  6 each Topical q morning - 10a  . darbepoetin (ARANESP) injection - DIALYSIS  100 mcg Intravenous Q Mon-HD  . feeding supplement (NEPRO CARB STEADY)  1,000 mL Per Tube Q24H  . feeding supplement (PRO-STAT SUGAR FREE 64)  60 mL Per Tube QID  . insulin aspart  0-24 Units Subcutaneous Q4H   . insulin glargine  42 Units Subcutaneous BID  . levalbuterol  0.63 mg Nebulization Q6H  . mouth rinse  15 mL Mouth Rinse 10 times per day  . pantoprazole sodium  40 mg Per Tube QHS  . sodium chloride flush  10-40 mL Intracatheter Q12H   Background 76 y.o. male PMH HTN, DM, OSA, chronic low back pain. Adm 7/25 with HF,  ^ trop., creatinine 1.4. Cath 7/27 (w/creatinine 1.96, improved to 1.1-1.2) ->3V ds. CABG 7/29 with progressive rise in creatinine post op, oliguria, failed extubation, marked volume overload. Renal was asked to see. Failed lasix challenge, CRRT initiated 8/2 for AKI/massive anasarca.    Assessment/Plan:   AKI on CKD3 2/2 ischemic ATN post CABG.  Persistent anuric AKI/no evidence return fx CRRT started  8/2.   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      Acute encephalopathy EEG 8/7 demonstrating moderate diffuse slowing of electrocerebral activity CT 5/42 embolic vs ischemic lesions (+/- anoxic encephalopathy) IV heparin MRI when more stable (planned for first of the week) Seems lighter, more UE movements that almost look intentional and less like posturing Neuro following  Jamal Maes, MD [place] Pager 06/23/2017, 9:20 AM

## 2017-06-23 NOTE — Progress Notes (Signed)
14 Days Post-Op Procedure(s) (LRB): CORONARY ARTERY BYPASS GRAFTING (CABG) x3 WITH TEE USING LEFT INTERNAL MAMMARY ARTERY AND RIGHT  SAPHENOUS LEG VEIN HARVESTED ENDOSCOPICALLY (N/A) Subjective: Remains intubated on CRRT.  Objective: Vital signs in last 24 hours: Temp:  [97.6 F (36.4 C)-99.2 F (37.3 C)] 98.1 F (36.7 C) (08/12 1145) Pulse Rate:  [88-132] 123 (08/12 1252) Cardiac Rhythm: Atrial flutter (08/12 0800) Resp:  [21-33] 24 (08/12 1252) BP: (70-123)/(34-84) 93/52 (08/12 1252) SpO2:  [86 %-100 %] 100 % (08/12 1304) FiO2 (%):  [40 %-50 %] 50 % (08/12 1304) Weight:  [103 kg (227 lb 1.2 oz)] 103 kg (227 lb 1.2 oz) (08/12 0400)  Hemodynamic parameters for last 24 hours: CVP:  [3 mmHg-85 mmHg] 4 mmHg  Intake/Output from previous day: 08/11 0701 - 08/12 0700 In: 3112.2 [I.V.:878.7; Blood:315; NG/GT:1320; IV Piggyback:598.5] Out: 2107 [Stool:709] Intake/Output this shift: Total I/O In: 996 [I.V.:350; Other:90; NG/GT:270; IV Piggyback:286] Out: 914 [Other:914]  General appearance: intubated Neurologic: unresponsive, opens eyes periodically, some posturing at times and appears purposeful at times trying to get arm restraints off. Heart: irregular rate and rhythm, S1, S2 normal, no murmur Lungs: coarse Abdomen: soft, bowel sounds normal Extremities: edema mild Wound: incisions ok  Lab Results:  Recent Labs  06/22/17 0315 06/23/17 0258  WBC 35.5* 35.6*  HGB 7.7* 8.4*  HCT 24.8* 26.3*  PLT 259 259   BMET:  Recent Labs  06/22/17 1555 06/23/17 0258  NA 136 136  K 3.4* 4.4  CL 101 99*  CO2 21* 26  GLUCOSE 192* 294*  BUN 36* 43*  CREATININE 1.62* 1.75*  CALCIUM 8.6* 8.6*    PT/INR: No results for input(s): LABPROT, INR in the last 72 hours. ABG    Component Value Date/Time   PHART 7.407 06/22/2017 2227   HCO3 24.2 06/22/2017 2227   TCO2 25 06/22/2017 2227   ACIDBASEDEF 1.0 06/16/2017 0348   O2SAT 58.5 06/23/2017 0310   CBG (last 3)   Recent Labs  06/23/17 0345 06/23/17 0752 06/23/17 1139  GLUCAP 288* 224* 181*   CLINICAL DATA:  Acute respiratory failure  EXAM: PORTABLE CHEST 1 VIEW  COMPARISON:  06/22/2017  FINDINGS: Cardiac shadow is mildly enlarged. Postsurgical changes are again seen. A you right subclavian central line as well as left jugular central line are again seen and stable. Endotracheal tube and nasogastric catheter are noted and stable. The lungs are well aerated with persistent left-sided asymmetric edema and right basilar atelectasis. No new focal abnormality is noted.  IMPRESSION: No change from the prior exam.   Electronically Signed   By: Alcide Clever M.D.   On: 06/23/2017 07:24  Assessment/Plan: S/P Procedure(s) (LRB): CORONARY ARTERY BYPASS GRAFTING (CABG) x3 WITH TEE USING LEFT INTERNAL MAMMARY ARTERY AND RIGHT  SAPHENOUS LEG VEIN HARVESTED ENDOSCOPICALLY (N/A)  Vasodilatory/Septic shock on levophed 26 mcg and milrinone 0.25. Co-ox 58.5. LVEF 60-65% on echo 8/9.   Posotp atrial fibrillation with RVR. Put back on IV amiodarone last night for HR 135. Now low 100's. On heparin.  VDRF: pulmonary following. Not weanable with neurologic dysfunction.  Postop neurologic dysfunction of undetermined etiology felt to be embolic vs anoxic encephalopathy. Continuing to support and planning MRI this week to assess further.  Leukocytosis to 35 K but afebrile on vanc and zosyn. All cultures negative so far.   Tolerating TF at goal with gut function.  DM with glucose in low 200's due to catecholemines, tube feeds, critical illness. Will increase Levemir further and continue SSI.  Discussed status and plans with wife.   LOS: 18 days    Alleen BorneBryan K Emmanuel Ercole 06/23/2017

## 2017-06-23 NOTE — Progress Notes (Signed)
PULMONARY / CRITICAL CARE MEDICINE   Name: Philip Love MRN: [id number] DOB: 1940/11/19    ADMISSION DATE:  05/30/2017 CONSULTATION DATE:  06/11/2017  REFERRING MD:  Morton Peters  CHIEF COMPLAINT:  Respiratory Decompensation/ vent management  Brief Patient Summary:   76 year old obese former smoker with OSA , and insulin-dependent diabetes recently moved from [place] to [facility]. He presented to the ER with 2 weeks of progressive shortness of breath ankle swelling and chest discomfort. He had nonspecific EKG changes but was in sinus rhythm. Cardiac enzymes were mildly positive and chest x-ray showed mild CHF. He was transferred to [facility]. His creatinine was 2. Echocardiogram showed EF of 45%. No significant MR. Creatinine improved to 1.7 and he underwent coronary angiography and right heart cath. Coronary showed a 90% left main stenosis, proximal 80-90% LAD stenosis, 90% stenosis of the mid RCA and 80% stenosis of the proximal circumflex. LVEDP is 12. Right heart cath showed cardiac output of 5.5 L/m with normal right-sided pressures. The patient was admitted to the ICU placed on IV heparin and nitroglycerin. His creatinine post cath was 1.7.  Patient underwent CABG x 3 with TEE using Left internal mammary artery and right saphenous leg vein on 06/06/2017. He was recovering in the CVICU, was extubated 06/10/2017. He refused CPAP the night of 7/30-7/31> He was in atrial fibrillation and when bolused with Amio he had a rhythm change with long pauses and significant change in mental status requiring emergent re-intubation per Dr. Delton Coombes 7/31. ABG showed a metabolic acidosis.( 7.311/ 41.5/390/22/ 21.1)  CCM consulted for vent management and evaluation of respiratory decompensation.   SUBJECTIVE:  No acute events overtnight. Remains on CVVHD (even) . No purposeful movement . Posturing .  Neuro following . Remains on milrinone/Levophed.   VITAL SIGNS: BP (!) 105/49   Pulse (!) 123   Temp  98.6 F (37 C) (Axillary)   Resp (!) 26   Ht 5\' 8"  (1.727 m)   Wt 227 lb 1.2 oz (103 kg)   SpO2 100%   BMI 34.53 kg/m   HEMODYNAMICS: CVP:  [3 mmHg-85 mmHg] 4 mmHg  VENTILATOR SETTINGS: Vent Mode: PRVC FiO2 (%):  [40 %-50 %] 50 % Set Rate:  [22 bmp] 22 bmp Vt Set:  [550 mL] 550 mL PEEP:  [5 cmH20] 5 cmH20 Pressure Support:  [14 cmH20] 14 cmH20 Plateau Pressure:  [24 cmH20] 24 cmH20  INTAKE / OUTPUT: I/O last 3 completed shifts: In: 4384.7 [I.V.:1271.2; Blood:315; NG/GT:1950; IV Piggyback:848.5] Out: 4098 [JXBJY:7829; Stool:809]  PHYSICAL EXAMINATION:  General: Chronically ill appearing male on vent .  HEENT: ETT , dry mucosa  Neuro: opens eyes, not following commands, posturing  CV: ST. No m/r/g.   PULM: diminshed BS in bases on vent  GI: Soft , BS+ , TF  Extremities: wm /dry , no edema  Skin: no rash    LABS:  BMET  Recent Labs Lab 06/22/17 0315 06/22/17 1555 06/23/17 0258  NA 138 136 136  K 4.1 3.4* 4.4  CL 100* 101 99*  CO2 30 21* 26  BUN 38* 36* 43*  CREATININE 1.84* 1.62* 1.75*  GLUCOSE 239* 192* 294*    Electrolytes  Recent Labs Lab 06/21/17 0430  06/22/17 0315 06/22/17 1555 06/23/17 0258  CALCIUM 8.8*  < > 9.1 8.6* 8.6*  MG 2.8*  --  2.6*  --  2.5*  PHOS 2.0*  < > 2.4* 3.1 2.2*  2.2*  < > = values in this interval not displayed.  CBC  Recent Labs Lab 06/21/17 0430 06/22/17 0315 06/23/17 0258  WBC 31.7* 35.5* 35.6*  HGB 8.5* 7.7* 8.4*  HCT 26.8* 24.8* 26.3*  PLT 258 259 259    Coag's No results for input(s): APTT, INR in the last 168 hours.  Sepsis Markers No results for input(s): LATICACIDVEN, PROCALCITON, O2SATVEN in the last 168 hours.  ABG  Recent Labs Lab 07/02/2017 0333 06/22/17 2227  PHART 7.393 7.407  PCO2ART 41.2 38.6  PO2ART 98.0 105.0    Liver Enzymes  Recent Labs Lab 06/18/17 0405  06/19/17 0419  06/22/17 0315 06/22/17 1555 06/23/17 0258  AST 56*  --  50*  --   --   --   --   ALT 19  --  22   --   --   --   --   ALKPHOS 325*  --  299*  --   --   --   --   BILITOT 1.1  --  1.1  --   --   --   --   ALBUMIN 3.2*  < > 3.4*  3.4*  < > 4.2 3.7 3.8  < > = values in this interval not displayed.  Cardiac Enzymes No results for input(s): TROPONINI, PROBNP in the last 168 hours.  Glucose  Recent Labs Lab 06/22/17 1121 06/22/17 1555 06/22/17 2023 06/22/17 2343 06/23/17 0345 06/23/17 0752  GLUCAP 219* 198* 122* 255* 288* 224*    Imaging Dg Chest Port 1 View  Result Date: 06/23/2017 CLINICAL DATA:  Acute respiratory failure EXAM: PORTABLE CHEST 1 VIEW COMPARISON:  06/22/2017 FINDINGS: Cardiac shadow is mildly enlarged. Postsurgical changes are again seen. A you right subclavian central line as well as left jugular central line are again seen and stable. Endotracheal tube and nasogastric catheter are noted and stable. The lungs are well aerated with persistent left-sided asymmetric edema and right basilar atelectasis. No new focal abnormality is noted. IMPRESSION: No change from the prior exam. Electronically Signed   By: Alcide CleverMark  Lukens M.D.   On: 06/23/2017 07:24    STUDIES:  ECHO 7/26 >> EF 40-45%, Moderate LVH, Mildly to moderately  reduced systolic dysfunction, MV mild regurgitation, LA mildly dilated LHC/RHC 7/27 >> 90% left main stenosis, proximal 80-90% LAD stenosis, 90% stenosis of the mid RCA and 80% stenosis of the proximal circumflex. LVEDP is 12, cardiac output of 5.5 L/m with normal right-sided pressures. EEG 8/7 >> moderate diffuse slowing, no focal, hemispheric or lateralizing features, no epileptiform activity  CT head 8/7 >> No acute large vascular territory infarction, intracranial hemorrhage, or focal mass effect; hypoattenuation bilateral medial basal ganglia can be associated/w anoxic or metabolic injury, age indeterminate; right inferior cerebellar lucency c/w age-indeterminate lacunar infarction; nonspecific foci of hypoattenuation in white matter compatible/w  microvascular ischemic changes; mild brain parenchymal volume loss. ECHO 8/9 >> LVEF 606-65%, no wall motion abnormalities, mild AS, RV mildly dilated  CULTURES: 7/27 MRSA PCR >> neg 7/27 surgical screen- urine >> MSSA postive Sputum 7/31>> multiple organism present, none predominant Respiratory 8/3 >> normal Trach asp 8/5 >> negative  BC 8/7 >>  Trach asp 8/7 >> negative  UC 8/7 >> neg Cdif 8/8 >> neg  ANTIBIOTICS: Ceftazidine 7/31>> 8/7 Zinacef 7/29 preop Zosyn 8/7 >> Vanc 8/7 >> Diflucan 8/10 >>   SIGNIFICANT EVENTS: 7/29  CABG 7/30  Extubation 7/31  Emergent re-intubation for mixed respiratory/metabolic acidosis 8/01  Self extubated -> BiPAP 8/02  CRRT 8/11  No sedation in over a week, not  waking  LINES/TUBES: R IJ Swan 7/29 >> 8/6 Left & Mediastinal CTc>> 7/29 R Radial A Line 7/29 >> 8/1 ETT 7/31 >> 8/1 Left femoral Aline 8/1 >> 8/6 L IJ HD cath 8/2 >> R SVC CVL 8/7 >>   DISCUSSION: 76 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  Post-op course complicated by prolonged vent needs, AKI on CVVHD & AF.  ASSESSMENT / PLAN:  PULMONARY A: Acute hypoxic respiratory failure Mixed Respiratory/Metabolic Acidosis  R/o HCAP/ LLL opacity vs pulmonary edema Suspected OSA/OHS  P:   PRVC 8 cc/kg Wean PEEP / FiO2 for sats > 92% Trend CXR  Will need CPAP/BiPAP post extubation  Daily SBT / WUA when mental status allows See ID  CARDIOVASCULAR A:  Cardiogenic shock Afib/ Bradycardia - on Amiodarone  CAD s/p CABGx3 7/29 Frequent ectopy P: ICU monitoring of hemodynamics Post-op care per CVTS Continue levophed, milrinone  Trend Co-Ox  RENAL A:   Acute on Chronic Kidney Disease  - oliguric NAGMA - resolved Hypermagnesemia Hypophosphatemia P:   Trend BMP / urinary output Replace electrolytes as indicated Avoid nephrotoxic agents, ensure adequate renal  perfusion CVVHD per Nephrology, goal even balance Appears to nearing euvolemic/dry  GASTROINTESTINAL A:   At Risk Protein Calorie Malnutrition  P:   PPI for SUP  Bowel regimen  TF per nutrition  HEMATOLOGIC A:   Anemia - stable P:  Trend CBC  Transfuse for Hgb <8%, 1 unit PRBC 8/11 SCD's for DVT prophylaxis   INFECTIOUS A:   ? HCAP Leukocytosis  P:   Follow cultures to maturity  Continue Vanco / Zosyn, D5/x Monitor fever curve / WBC trend  ENDOCRINE A:   IDDM - HA1c 6.3 P:   SSI Increase Lantus to 42 units BID   NEUROLOGIC A:   Acute Encephalopathy - in setting of critical illness, AKI, sedation  Back pain - last sedation 8/8 Embolic CVA -Neuro following  - fentanyl 25 mcg given 8/8 around 0715 for biting, otherwise no sedation since 8/6 - now with decerebrate posturing, clenching requiring bite block after biting tongue last night.   P:   RASS Goal:  0  Neurology following > rec's for MRI when able, waiting one week for neuro reassessment Minimize sedation as able  PRN fentanyl for clenching / protect ETT  FAMILY  - Updates: No family at bedside 8/12 am.  Prior family conference held with Dr. Donata Clay, pending neuro exam, family does not wish for prolonged aggressive therapies if no reasonable chance of recovery.    - Inter-disciplinary family meet or Palliative Care meeting due - ongoing    Tarnisha Kachmar NP-C  [facility]  [phone]   06/23/2017, 8:41 AM

## 2017-06-23 NOTE — Progress Notes (Signed)
ANTICOAGULATION CONSULT NOTE - Follow-Up Consult  Pharmacy Consult for heparin Indication: atrial fibrillation  Allergies  Allergen Reactions  . Bee Venom Swelling    Patient Measurements: Height: 5\' 8"  (172.7 cm) Weight: 227 lb 1.2 oz (103 kg) IBW/kg (Calculated) : 68.4 Heparin Dosing Weight: 89 kg  Vital Signs: Temp: 98.6 F (37 C) (08/12 0730) Temp Source: Axillary (08/12 0730) BP: 105/50 (08/12 0825) Pulse Rate: 123 (08/12 0825)  Labs:  Recent Labs  06/21/17 0430  06/22/17 0315 06/22/17 1152 06/22/17 1555 06/22/17 2111 06/23/17 0258  HGB 8.5*  --  7.7*  --   --   --  8.4*  HCT 26.8*  --  24.8*  --   --   --  26.3*  PLT 258  --  259  --   --   --  259  HEPARINUNFRC  --   --   --  0.27*  --  0.32 0.34  CREATININE 1.80*  < > 1.84*  --  1.62*  --  1.75*  < > = values in this interval not displayed.  Estimated Creatinine Clearance: 43.7 mL/min (A) (by C-G formula based on SCr of 1.75 mg/dL (H)).   Medical History: Past Medical History:  Diagnosis Date  . Arthritis    "generalized; elbows; knees" (05/26/2017)  . CHF (congestive heart failure) (HCC) 05/20/2017  . Chronic lower back pain   . Essential hypertension   . Hyperlipidemia   . Obesity (BMI 30-39.9)   . Type 2 diabetes mellitus (HCC)     Assessment: [age] yoM s/p CABG with persistent post-op atrial fibrillation with HR in 100s. Heparin was initiated by TCTS 8/10 and now to continue per pharmacy. Heparin level therapeutic at 0.34. Hgb up to 8.4 s/p 1 units pRBC yesterday, pltc wnl.  Goal of Therapy:  Heparin level 0.3-0.7 units/ml Monitor platelets by anticoagulation protocol: Yes   Plan:  -Continue heparin 900 units/hr -Monitor heparin level, CBC, S/Sx bleeding daily  Fredonia HighlandMichael Bitonti, PharmD PGY-2 Cardiology Pharmacy Resident Pager: [phone] 06/23/2017

## 2017-06-23 NOTE — Progress Notes (Signed)
1600 CBG not crossing over at this time. CBG was 307.

## 2017-06-24 ENCOUNTER — Inpatient Hospital Stay (HOSPITAL_COMMUNITY): Payer: Medicare Other

## 2017-06-24 DIAGNOSIS — J811 Chronic pulmonary edema: Secondary | ICD-10-CM

## 2017-06-24 DIAGNOSIS — I4892 Unspecified atrial flutter: Secondary | ICD-10-CM

## 2017-06-24 DIAGNOSIS — E785 Hyperlipidemia, unspecified: Secondary | ICD-10-CM

## 2017-06-24 LAB — RENAL FUNCTION PANEL
ALBUMIN: 3.9 g/dL (ref 3.5–5.0)
Albumin: 3.3 g/dL — ABNORMAL LOW (ref 3.5–5.0)
Anion gap: 19 — ABNORMAL HIGH (ref 5–15)
Anion gap: 29 — ABNORMAL HIGH (ref 5–15)
BUN: 33 mg/dL — AB (ref 6–20)
BUN: 37 mg/dL — ABNORMAL HIGH (ref 6–20)
CALCIUM: 8.9 mg/dL (ref 8.9–10.3)
CHLORIDE: 96 mmol/L — AB (ref 101–111)
CO2: 19 mmol/L — ABNORMAL LOW (ref 22–32)
CO2: 8 mmol/L — ABNORMAL LOW (ref 22–32)
Calcium: 8.4 mg/dL — ABNORMAL LOW (ref 8.9–10.3)
Chloride: 98 mmol/L — ABNORMAL LOW (ref 101–111)
Creatinine, Ser: 1.73 mg/dL — ABNORMAL HIGH (ref 0.61–1.24)
Creatinine, Ser: 2.37 mg/dL — ABNORMAL HIGH (ref 0.61–1.24)
GFR calc Af Amer: 43 mL/min — ABNORMAL LOW (ref >60)
GFR, EST AFRICAN AMERICAN: 30 mL/min — AB (ref >60)
GFR, EST NON AFRICAN AMERICAN: 37 mL/min — AB (ref >60)
GFR, EST NON AFRICAN AMERICAN: 26 mL/min — AB (ref >60)
GLUCOSE: 192 mg/dL — AB (ref 65–99)
Glucose, Bld: 277 mg/dL — ABNORMAL HIGH (ref 65–99)
PHOSPHORUS: 4.1 mg/dL (ref 2.5–4.6)
POTASSIUM: 4.8 mmol/L (ref 3.5–5.1)
Phosphorus: 11.7 mg/dL — ABNORMAL HIGH (ref 2.5–4.6)
Potassium: 6.1 mmol/L — ABNORMAL HIGH (ref 3.5–5.1)
SODIUM: 136 mmol/L (ref 135–145)
Sodium: 133 mmol/L — ABNORMAL LOW (ref 135–145)

## 2017-06-24 LAB — POCT I-STAT 3, ART BLOOD GAS (G3+)
ACID-BASE DEFICIT: 6 mmol/L — AB (ref 0.0–2.0)
ACID-BASE DEFICIT: 12 mmol/L — AB (ref 0.0–2.0)
ACID-BASE DEFICIT: 12 mmol/L — AB (ref 0.0–2.0)
Acid-base deficit: 13 mmol/L — ABNORMAL HIGH (ref 0.0–2.0)
Bicarbonate: 18.8 mmol/L — ABNORMAL LOW (ref 20.0–28.0)
Bicarbonate: 13.5 mmol/L — ABNORMAL LOW (ref 20.0–28.0)
Bicarbonate: 12.3 mmol/L — ABNORMAL LOW (ref 20.0–28.0)
Bicarbonate: 12.9 mmol/L — ABNORMAL LOW (ref 20.0–28.0)
O2 Saturation: 92 %
O2 Saturation: 95 %
O2 Saturation: 90 %
O2 Saturation: 91 %
PH ART: 7.367 (ref 7.350–7.450)
PH ART: 7.277 — AB (ref 7.350–7.450)
PO2 ART: 86 mmHg (ref 83.0–108.0)
PO2 ART: 53 mmHg — AB (ref 83.0–108.0)
Patient temperature: 99.2
TCO2: 20 mmol/L (ref 0–100)
TCO2: 14 mmol/L (ref 0–100)
TCO2: 13 mmol/L (ref 0–100)
TCO2: 14 mmol/L (ref 0–100)
pCO2 arterial: 32.7 mmHg (ref 32.0–48.0)
pCO2 arterial: 27.9 mmHg — ABNORMAL LOW (ref 32.0–48.0)
pCO2 arterial: 26.3 mmHg — ABNORMAL LOW (ref 32.0–48.0)
pCO2 arterial: 23.8 mmHg — ABNORMAL LOW (ref 32.0–48.0)
pH, Arterial: 7.293 — ABNORMAL LOW (ref 7.350–7.450)
pH, Arterial: 7.327 — ABNORMAL LOW (ref 7.350–7.450)
pO2, Arterial: 65 mmHg — ABNORMAL LOW (ref 83.0–108.0)
pO2, Arterial: 65 mmHg — ABNORMAL LOW (ref 83.0–108.0)

## 2017-06-24 LAB — COOXEMETRY PANEL
Carboxyhemoglobin: 1.6 % — ABNORMAL HIGH (ref 0.5–1.5)
Carboxyhemoglobin: 0.3 % — ABNORMAL LOW (ref 0.5–1.5)
Methemoglobin: 1.1 % (ref 0.0–1.5)
Methemoglobin: 1.9 % — ABNORMAL HIGH (ref 0.0–1.5)
O2 Saturation: 56.8 %
O2 Saturation: 37.8 %
Total hemoglobin: 10.2 g/dL — ABNORMAL LOW (ref 12.0–16.0)
Total hemoglobin: 8.6 g/dL — ABNORMAL LOW (ref 12.0–16.0)

## 2017-06-24 LAB — CBC
HEMATOCRIT: 31.8 % — AB (ref 39.0–52.0)
Hemoglobin: 10.2 g/dL — ABNORMAL LOW (ref 13.0–17.0)
MCH: 29.1 pg (ref 26.0–34.0)
MCHC: 32.1 g/dL (ref 30.0–36.0)
MCV: 90.9 fL (ref 78.0–100.0)
Platelets: 244 10*3/uL (ref 150–400)
RBC: 3.5 MIL/uL — AB (ref 4.22–5.81)
RDW: 17.6 % — ABNORMAL HIGH (ref 11.5–15.5)
WBC: 41.4 10*3/uL — ABNORMAL HIGH (ref 4.0–10.5)

## 2017-06-24 LAB — GLUCOSE, CAPILLARY
GLUCOSE-CAPILLARY: 202 mg/dL — AB (ref 65–99)
GLUCOSE-CAPILLARY: 307 mg/dL — AB (ref 65–99)
GLUCOSE-CAPILLARY: 181 mg/dL — AB (ref 65–99)
GLUCOSE-CAPILLARY: 153 mg/dL — AB (ref 65–99)
GLUCOSE-CAPILLARY: 286 mg/dL — AB (ref 65–99)
GLUCOSE-CAPILLARY: 147 mg/dL — AB (ref 65–99)
GLUCOSE-CAPILLARY: 92 mg/dL (ref 65–99)
Glucose-Capillary: 212 mg/dL — ABNORMAL HIGH (ref 65–99)
Glucose-Capillary: 264 mg/dL — ABNORMAL HIGH (ref 65–99)

## 2017-06-24 LAB — POCT I-STAT, CHEM 8
BUN: 40 mg/dL — ABNORMAL HIGH (ref 6–20)
Calcium, Ion: 0.81 mmol/L — CL (ref 1.15–1.40)
Chloride: 98 mmol/L — ABNORMAL LOW (ref 101–111)
Creatinine, Ser: 2.1 mg/dL — ABNORMAL HIGH (ref 0.61–1.24)
GLUCOSE: 150 mg/dL — AB (ref 65–99)
HCT: 26 % — ABNORMAL LOW (ref 39.0–52.0)
Hemoglobin: 8.8 g/dL — ABNORMAL LOW (ref 13.0–17.0)
POTASSIUM: 5.8 mmol/L — AB (ref 3.5–5.1)
Sodium: 137 mmol/L (ref 135–145)
TCO2: 18 mmol/L (ref 0–100)

## 2017-06-24 LAB — HEPARIN LEVEL (UNFRACTIONATED): Heparin Unfractionated: 0.44 IU/mL (ref 0.30–0.70)

## 2017-06-24 LAB — HEMOGLOBIN A1C
HEMOGLOBIN A1C: 5.8 % — AB (ref 4.8–5.6)
MEAN PLASMA GLUCOSE: 120 mg/dL

## 2017-06-24 LAB — MAGNESIUM: MAGNESIUM: 3 mg/dL — AB (ref 1.7–2.4)

## 2017-06-24 LAB — PREPARE RBC (CROSSMATCH)

## 2017-06-24 MED ORDER — LIDOCAINE HCL (CARDIAC) 20 MG/ML IV SOLN
INTRAVENOUS | Status: AC
  Administered 2017-06-24: 50 mg
  Filled 2017-06-24: qty 5

## 2017-06-24 MED ORDER — SODIUM BICARBONATE 8.4 % IV SOLN
100.0000 meq | Freq: Once | INTRAVENOUS | Status: AC
  Administered 2017-06-24: 100 meq via INTRAVENOUS
  Filled 2017-06-24: qty 50

## 2017-06-24 MED ORDER — NEPRO/CARBSTEADY PO LIQD
1000.0000 mL | ORAL | Status: DC
  Administered 2017-06-24: 1000 mL
  Filled 2017-06-24 (×2): qty 1000

## 2017-06-24 MED ORDER — VASOPRESSIN 20 UNIT/ML IV SOLN
0.0300 [IU]/min | INTRAVENOUS | Status: DC
  Administered 2017-06-24 – 2017-06-25 (×2): 0.03 [IU]/min via INTRAVENOUS
  Filled 2017-06-24 (×2): qty 2

## 2017-06-24 MED ORDER — MAGNESIUM SULFATE 2 GM/50ML IV SOLN: 2.0000 g | Freq: Once | INTRAVENOUS | Status: DC

## 2017-06-24 MED ORDER — SODIUM CHLORIDE 0.9 % IV SOLN
1.0000 g | Freq: Two times a day (BID) | INTRAVENOUS | Status: DC
Start: 2017-06-24 — End: 2017-06-25
  Administered 2017-06-24 – 2017-06-25 (×2): 1 g via INTRAVENOUS
  Filled 2017-06-24 (×2): qty 1

## 2017-06-24 MED ORDER — LIDOCAINE HCL (PF) 1 % IJ SOLN
INTRAMUSCULAR | Status: AC
  Filled 2017-06-24: qty 30

## 2017-06-24 MED ORDER — LIDOCAINE HCL (CARDIAC) 20 MG/ML IV SOLN
INTRAVENOUS | Status: AC
  Administered 2017-06-24: 100 mg
  Filled 2017-06-24: qty 5

## 2017-06-24 MED ORDER — SODIUM BICARBONATE 8.4 % IV SOLN
INTRAVENOUS | Status: AC
  Filled 2017-06-24: qty 50

## 2017-06-24 MED ORDER — LIDOCAINE HCL (PF) 2 % IJ SOLN
2.5000 mL | Freq: Once | INTRAMUSCULAR | Status: AC
  Filled 2017-06-24: qty 4

## 2017-06-24 MED ORDER — MAGNESIUM SULFATE 2 GM/50ML IV SOLN
2.0000 g | Freq: Once | INTRAVENOUS | Status: DC
  Administered 2017-06-24: 2 g via INTRAVENOUS

## 2017-06-24 MED ORDER — ALBUMIN HUMAN 5 % IV SOLN
INTRAVENOUS | Status: AC
  Filled 2017-06-24: qty 250

## 2017-06-24 MED ORDER — SODIUM BICARBONATE 8.4 % IV SOLN
100.0000 meq | Freq: Once | INTRAVENOUS | Status: AC
  Administered 2017-06-24: 100 meq via INTRAVENOUS

## 2017-06-24 MED ORDER — MEROPENEM 1 G IV SOLR
1.0000 g | Freq: Three times a day (TID) | INTRAVENOUS | Status: DC
  Filled 2017-06-24: qty 1

## 2017-06-24 MED ORDER — STERILE WATER FOR INJECTION IV SOLN
INTRAVENOUS | Status: DC
  Administered 2017-06-25 (×2): via INTRAVENOUS_CENTRAL
  Filled 2017-06-24 (×5): qty 300

## 2017-06-24 MED ORDER — EPINEPHRINE PF 1 MG/ML IJ SOLN
0.5000 ug/min | INTRAVENOUS | Status: DC
  Administered 2017-06-24: 1 ug/min via INTRAVENOUS
  Filled 2017-06-24 (×2): qty 4

## 2017-06-24 MED ORDER — MAGNESIUM SULFATE 2 GM/50ML IV SOLN
INTRAVENOUS | Status: AC
  Filled 2017-06-24: qty 50

## 2017-06-24 MED ORDER — ALBUMIN HUMAN 5 % IV SOLN
12.5000 g | Freq: Once | INTRAVENOUS | Status: AC
  Administered 2017-06-24: 12.5 g via INTRAVENOUS

## 2017-06-24 NOTE — Progress Notes (Signed)
15 Days Post-Op Procedure(s) (LRB): CORONARY ARTERY BYPASS GRAFTING (CABG) x3 WITH TEE USING LEFT INTERNAL MAMMARY ARTERY AND RIGHT  SAPHENOUS LEG VEIN HARVESTED ENDOSCOPICALLY (N/A) Subjective: Vasopressin added this a.m. for low blood pressure and rising white count all cultures remained negative Antibiotics changed-meropenem started and Zosyn discontinued Chest x-ray shows no consolidative pneumonia Ultrasound of abdomen shows gallstones without evidence of cholecystitis HD catheter placed 10 days ago in left neck was removed and changed out today to a clean line Patient's responsiveness somewhat improved but today he appears non-purposeful We'll assess for full body scan in radiology tomorrow  Objective: Vital signs in last 24 hours: Temp:  [96.2 F (35.7 C)-99.2 F (37.3 C)] 98.9 F (37.2 C) (08/13 1558) Pulse Rate:  [32-127] 32 (08/13 1730) Cardiac Rhythm: Atrial flutter (08/13 0800) Resp:  [20-34] 24 (08/13 1730) BP: (52-129)/(26-101) 99/47 (08/13 1700) SpO2:  [88 %-100 %] 99 % (08/13 1730) Arterial Line BP: (103-112)/(26-35) 112/35 (08/13 1730) FiO2 (%):  [40 %] 40 % (08/13 1413) Weight:  [230 lb 2.6 oz (104.4 kg)] 230 lb 2.6 oz (104.4 kg) (08/13 0500)  Hemodynamic parameters for last 24 hours: CVP:  [0 mmHg-10 mmHg] 10 mmHg  Intake/Output from previous day: 08/12 0701 - 08/13 0700 In: 3945.3 [I.V.:1431.7; Blood:340; NG/GT:1120; IV Piggyback:606.5] Out: 4553 [Stool:600] Intake/Output this shift: Total I/O In: 904 [I.V.:638; NG/GT:80; IV Piggyback:186] Out: 21 [Other:21]       Exam    General-eyes open and comfortable   Lungs- clear without rales, wheezes   Cor- Sinus rhythm with PACs, PVCs, no murmur    Abdomen- soft, non-tender   Extremities - warm, non-tender, minimal edema   Neuro-, no focal weakness   Lab Results:  Recent Labs  06/23/17 0258 06/24/17 0525  WBC 35.6* 41.4*  HGB 8.4* 10.2*  HCT 26.3* 31.8*  PLT 259 244   BMET:  Recent Labs  06/24/17 0525 06/24/17 1600  NA 136 133*  K 4.8 6.1*  CL 98* 96*  CO2 19* 8*  GLUCOSE 192* 277*  BUN 33* 37*  CREATININE 1.73* 2.37*  CALCIUM 8.9 8.4*    PT/INR: No results for input(s): LABPROT, INR in the last 72 hours. ABG    Component Value Date/Time   PHART 7.293 (L) 06/24/2017 1327   HCO3 13.5 (L) 06/24/2017 1327   TCO2 14 06/24/2017 1327   ACIDBASEDEF 12.0 (H) 06/24/2017 1327   O2SAT 95.0 06/24/2017 1327   CBG (last 3)   Recent Labs  06/24/17 0804 06/24/17 1143 06/24/17 1556  GLUCAP 153* 286* 264*    Assessment/Plan: S/P Procedure(s) (LRB): CORONARY ARTERY BYPASS GRAFTING (CABG) x3 WITH TEE USING LEFT INTERNAL MAMMARY ARTERY AND RIGHT  SAPHENOUS LEG VEIN HARVESTED ENDOSCOPICALLY (N/A) Rising WBC, evidence sepsis- antibiotics changed, lines changed Incisions clean and dry, no fever Concern for intra-abdominal infection with acidosis - KUB in am situation d/w family in ICU   LOS: 19 days    Kathlee Nationseter Van Trigt III 06/24/2017

## 2017-06-24 NOTE — Progress Notes (Signed)
Nutrition Follow-up  DOCUMENTATION CODES:   Morbid obesity  INTERVENTION:    Decrease Nepro to goal rate of 10 ml/h  Pro-stat 60 ml QID  Provides 1232 kcal, 139 gm protein, 174 ml free water daily  NUTRITION DIAGNOSIS:   Inadequate oral intake related to inability to eat as evidenced by NPO status.  Ongoing  GOAL:   Provide needs based on ASPEN/SCCM guidelines  Met  MONITOR:   Vent status, TF tolerance, Labs, Weight trends, Skin, I & O's  ASSESSMENT:   76 yo obese former smoker with OSA , and insulin-dependent diabetes recently moved from [place] to [facility]. He presented to the emergency department there with 2 weeks of progressive shortness of breath ankle swelling and chest discomfort.   S/P CABG 7/29. CRRT started 8/2.  Currently receiving Nepro via OGT at 40 ml/h (960 ml/day) with Prostat 60 ml QID to provide 2528 kcals, 198 gm protein, 698 ml free water daily.  Current TF regimen is exceeding kcal and protein needs, will adjust regimen to better meet nutrition needs.  Patient remains intubated on ventilator support Temp (24hrs), Avg:97.8 F (36.6 C), Min:96.2 F (35.7 C), Max:99.2 F (37.3 C)   Labs and medications reviewed.  I/O -15 L since admission, keeping even with CRRT.  Diet Order:  Diet NPO time specified  Skin:  Reviewed, no issues  Last BM:  8/12  Height:   Ht Readings from Last 1 Encounters:  06/14/17 5' 8"  (1.727 m)    Weight:   Wt Readings from Last 1 Encounters:  06/24/17 230 lb 2.6 oz (104.4 kg)    Ideal Body Weight:  70 kg  BMI:  Body mass index is 35 kg/m.  Estimated Nutritional Needs:   Kcal:  1219-7588  Protein:  >/= 139 gm  Fluid:  per MD  EDUCATION NEEDS:   No education needs identified at this time  Molli Barrows, [place], [place], [place] Pager [phone] After Hours Pager [phone]

## 2017-06-24 NOTE — Progress Notes (Signed)
ANTICOAGULATION CONSULT NOTE - Follow-Up Consult  Pharmacy Consult for heparin Indication: atrial fibrillation  Allergies  Allergen Reactions  . Bee Venom Swelling    Patient Measurements: Height: 5\' 8"  (172.7 cm) Weight: 230 lb 2.6 oz (104.4 kg) IBW/kg (Calculated) : 68.4 Heparin Dosing Weight: 89 kg  Vital Signs: Temp: 96.2 F (35.7 C) (08/13 0300) Temp Source: Axillary (08/13 0300) BP: 128/97 (08/13 0645) Pulse Rate: 115 (08/13 0645)  Labs:  Recent Labs  06/22/17 0315  06/22/17 2111 06/23/17 0258 06/23/17 1722 06/24/17 0525 06/24/17 0526  HGB 7.7*  --   --  8.4*  --  10.2*  --   HCT 24.8*  --   --  26.3*  --  31.8*  --   PLT 259  --   --  259  --  244  --   HEPARINUNFRC  --   < > 0.32 0.34  --   --  0.44  CREATININE 1.84*  < >  --  1.75* 1.83* 1.73*  --   < > = values in this interval not displayed.  Estimated Creatinine Clearance: 44.5 mL/min (A) (by C-G formula based on SCr of 1.73 mg/dL (H)).   Medical History: Past Medical History:  Diagnosis Date  . Arthritis    "generalized; elbows; knees" (06/11/2017)  . CHF (congestive heart failure) (HCC) 06/10/2017  . Chronic lower back pain   . Essential hypertension   . Hyperlipidemia   . Obesity (BMI 30-39.9)   . Type 2 diabetes mellitus (HCC)     Assessment: [age] yoM s/p CABG with persistent post-op atrial fibrillation with HR in 100s. Heparin was initiated by TCTS 8/10 and now to continue per pharmacy. Heparin level therapeutic at 0.44. Hgb up to 10.2, pltc wnl. No s/s of overt bleeding.   Goal of Therapy:  Heparin level 0.3-0.7 units/ml Monitor platelets by anticoagulation protocol: Yes   Plan:  -Continue heparin 900 units/hr -Monitor heparin level, CBC, S/Sx bleeding daily  Vinnie LevelBenjamin Syana Degraffenreid, PharmD., BCPS Clinical Pharmacist Pager [phone]

## 2017-06-24 NOTE — Progress Notes (Signed)
Dr Beacher MayGerhadt was informed that patients blood pressure map is in the 40s and rhythm is junctional in the 40s as well. Order given for bicarb and amiodarone to be stopped. Md wants hemodynamic  issues to be addressed by critical care.

## 2017-06-24 NOTE — Progress Notes (Signed)
Patient ID: Elam DutchRobert Strassner, male   DOB: [birth year], 76 y.o.   MRN: [id number] EVENING ROUNDS NOTE :     [address]       [place] [id number]             [phone]                 15 Days Post-Op Procedure(s) (LRB): CORONARY ARTERY BYPASS GRAFTING (CABG) x3 WITH TEE USING LEFT INTERNAL MAMMARY ARTERY AND RIGHT  SAPHENOUS LEG VEIN HARVESTED ENDOSCOPICALLY (N/A)  Total Length of Stay:  LOS: 19 days  BP 108/65   Pulse (!) 30   Temp 98.9 F (37.2 C) (Oral)   Resp (!) 24   Ht 5\' 8"  (1.727 m)   Wt 230 lb 2.6 oz (104.4 kg)   SpO2 97%   BMI 35.00 kg/m   .Intake/Output      08/13 0701 - 08/14 0700   I.V. (mL/kg) 702.8 (6.7)   NG/GT 120   IV Piggyback 186   Total Intake(mL/kg) 1008.8 (9.7)   Urine (mL/kg/hr) 0 (0)   Other 188   Total Output 188   Net +820.8         . amiodarone Stopped (06/24/17 2015)  . epinephrine    . heparin 900 Units/hr (06/24/17 2035)  . lactated ringers    . lactated ringers Stopped (2017/02/14 0400)  . meropenem (MERREM) IV Stopped (06/24/17 1846)  . milrinone 0.25 mcg/kg/min (06/24/17 1638)  . norepinephrine (LEVOPHED) Adult infusion 30 mcg/min (06/24/17 1638)  . dialysis replacement fluid (prismasate) 300 mL/hr at 06/24/17 1737  . dialysis replacement fluid (prismasate) 300 mL/hr at 06/24/17 1737  . dialysate (PRISMASATE) 1,500 mL/hr at 06/24/17 1737  . propofol (DIPRIVAN) infusion Stopped (06/20/17 0830)  . sodium chloride    . sodium phosphate  Dextrose 5% IVPB Stopped (06/24/17 1552)  . vancomycin 750 mg (06/24/17 1130)  . vasopressin (PITRESSIN) infusion - *FOR SHOCK* 0.03 Units/min (06/24/17 0700)     Lab Results  Component Value Date   WBC 41.4 (H) 06/24/2017   HGB 10.2 (L) 06/24/2017   HCT 31.8 (L) 06/24/2017   PLT 244 06/24/2017   GLUCOSE 277 (H) 06/24/2017   CHOL 48 06/23/2017   TRIG 165 (H) 06/23/2017   HDL 12 (L) 06/23/2017   LDLCALC 3 06/23/2017   ALT 22 06/19/2017   AST 50 (H) 06/19/2017   NA 133 (L)  06/24/2017   K 6.1 (H) 06/24/2017   CL 96 (L) 06/24/2017   CREATININE 2.37 (H) 06/24/2017   BUN 37 (H) 06/24/2017   CO2 8 (L) 06/24/2017   TSH 5.899 (H) 06/21/2017   INR 1.47 05/26/2017   HGBA1C 5.8 (H) 06/23/2017   Progressive sepsis, with hypotension, renal failure with acidosis renal and ccm following    Delight OvensEdward B Kelsey Edman MD  Beeper [phone] Office [phone] 06/24/2017 8:38 PM

## 2017-06-24 NOTE — Progress Notes (Signed)
K 6.1 called to Dr Kathrene BongoGoldsborough. Currently restarting CRRT, will maintain current replacement fluids.

## 2017-06-24 NOTE — Progress Notes (Signed)
Progress Note  Patient Name: Philip Love Date of Encounter: 06/24/2017  Primary Cardiologist: Dr. Wynonia Lawman ([place])  Subjective   Eyes opens. Not purposeful.    Inpatient Medications    Scheduled Meds: . aspirin EC  325 mg Oral Daily   Or  . aspirin  324 mg Per Tube Daily  . atorvastatin  80 mg Per Tube q1800  . chlorhexidine gluconate (MEDLINE KIT)  15 mL Mouth Rinse BID  . Chlorhexidine Gluconate Cloth  6 each Topical q morning - 10a  . darbepoetin (ARANESP) injection - DIALYSIS  200 mcg Intravenous Q Mon-HD  . feeding supplement (NEPRO CARB STEADY)  1,000 mL Per Tube Q24H  . feeding supplement (PRO-STAT SUGAR FREE 64)  60 mL Per Tube QID  . insulin aspart  0-24 Units Subcutaneous Q4H  . insulin glargine  50 Units Subcutaneous BID  . levalbuterol  0.63 mg Nebulization Q6H  . mouth rinse  15 mL Mouth Rinse 10 times per day  . pantoprazole sodium  40 mg Per Tube QHS  . sodium chloride flush  10-40 mL Intracatheter Q12H   Continuous Infusions: . albumin human    . albumin human    . amiodarone 30 mg/hr (06/24/17 0700)  . heparin 900 Units/hr (06/24/17 0700)  . lactated ringers    . lactated ringers Stopped (07/02/2017 0400)  . milrinone 0.25 mcg/kg/min (06/24/17 0700)  . norepinephrine (LEVOPHED) Adult infusion 29.973 mcg/min (06/24/17 0700)  . piperacillin-tazobactam Stopped (06/24/17 0022)  . dialysis replacement fluid (prismasate) 300 mL/hr at 06/23/17 1703  . dialysis replacement fluid (prismasate) 300 mL/hr at 06/23/17 1703  . dialysate (PRISMASATE) 1,500 mL/hr at 06/24/17 1610  . propofol (DIPRIVAN) infusion Stopped (06/20/17 0830)  . sodium chloride    . sodium phosphate  Dextrose 5% IVPB 30 mmol (06/23/17 1019)  . vancomycin Stopped (06/23/17 1351)  . vasopressin (PITRESSIN) infusion - *FOR SHOCK* 0.03 Units/min (06/24/17 0700)   PRN Meds: acetaminophen, fentaNYL (SUBLIMAZE) injection, heparin, ondansetron (ZOFRAN) IV, sodium chloride, sodium chloride flush    Vital Signs    Vitals:   06/24/17 0617 06/24/17 0630 06/24/17 0631 06/24/17 0645  BP: (!) 75/64 (!) 52/31 (!) 76/64 (!) 128/97  Pulse: (!) 115 (!) 115 (!) 115 (!) 115  Resp: (!) 29 (!) 27 (!) 28 (!) 28  Temp:      TempSrc:      SpO2: 100% 100% 100% 100%  Weight:      Height:        Intake/Output Summary (Last 24 hours) at 06/24/17 0756 Last data filed at 06/24/17 0700  Gross per 24 hour  Intake          3945.26 ml  Output             4553 ml  Net          -607.74 ml   Filed Weights   06/22/17 0300 06/23/17 0400 06/24/17 0500  Weight: 223 lb 12.3 oz (101.5 kg) 227 lb 1.2 oz (103 kg) 230 lb 2.6 oz (104.4 kg)    Telemetry    Tachycardia.  Probable flutter.  - Personally Reviewed  ECG    NA - Personally Reviewed  Physical Exam   GEN: Intubated Neck: No  JVD Cardiac: RRR, no murmurs, rubs, or gallops.  Respiratory: Clear  to auscultation bilaterally. GI: Soft, nontender, decreased bowel sounds MS:  Diffuse edema; No deformity. Neuro:  Eyes open, non purposeful   Labs    Chemistry Recent Labs Lab 06/18/17 0405  06/19/17 0419  06/23/17 0258 06/23/17 1722 06/24/17 0525  NA 134*  < > 137  138  < > 136 135 136  K 4.1  < > 3.8  3.9  < > 4.4 4.4 4.8  CL 98*  < > 100*  101  < > 99* 99* 98*  CO2 24  < > 26  28  < > 26 22 19*  GLUCOSE 287*  < > 170*  170*  < > 294* 339* 192*  BUN 16  < > 23*  24*  < > 43* 41* 33*  CREATININE 1.80*  < > 1.67*  1.64*  < > 1.75* 1.83* 1.73*  CALCIUM 7.7*  < > 8.0*  8.1*  < > 8.6* 8.5* 8.9  PROT 6.1*  --  6.2*  --   --   --   --   ALBUMIN 3.2*  < > 3.4*  3.4*  < > 3.8 3.5 3.9  AST 56*  --  50*  --   --   --   --   ALT 19  --  22  --   --   --   --   ALKPHOS 325*  --  299*  --   --   --   --   BILITOT 1.1  --  1.1  --   --   --   --   GFRNONAA 36*  < > 39*  40*  < > 37* 35* 37*  GFRAA 41*  < > 45*  46*  < > 43* 41* 43*  ANIONGAP 12  < > 11  9  < > 11 14 19*  < > = values in this interval not displayed.    Hematology Recent Labs Lab 06/22/17 0315 06/23/17 0258 06/24/17 0525  WBC 35.5* 35.6* 41.4*  RBC 2.75* 2.97* 3.50*  HGB 7.7* 8.4* 10.2*  HCT 24.8* 26.3* 31.8*  MCV 90.2 88.6 90.9  MCH 28.0 28.3 29.1  MCHC 31.0 31.9 32.1  RDW 16.7* 17.6* 17.6*  PLT 259 259 244    Cardiac EnzymesNo results for input(s): TROPONINI in the last 168 hours. No results for input(s): TROPIPOC in the last 168 hours.   BNPNo results for input(s): BNP, PROBNP in the last 168 hours.   DDimer No results for input(s): DDIMER in the last 168 hours.   Radiology    Dg Chest Port 1 View  Result Date: 06/24/2017 CLINICAL DATA:  Status post CABG on June 09, 2017. Intubated patient with respiratory failure. EXAM: PORTABLE CHEST 1 VIEW COMPARISON:  Portable chest x-ray of June 23, 2017 FINDINGS: Right lung remains mildly hypoinflated is compared to the left. The interstitial markings are increased and more conspicuous today greatest on the left. Small amounts of pleural fluid blunt the right lateral costophrenic angle. There is no pneumothorax. The cardiac silhouette is enlarged. The pulmonary vascularity is indistinct. A dual-lumen dialysis catheter tip projects at the junction of the right and left brachiocephalic veins. A right subclavian venous catheter tip projects over the lower portion of the SVC. The endotracheal tube tip lies approximately 4.2 cm above the carina. The esophagogastric tube tip and proximal port project below the inferior aspect of the image. IMPRESSION: Slight interval worsening in the appearance of the pulmonary interstitium may reflect pulmonary edema. ARDS could present in a similar fashion. Post CABG changes. The support tubes are in reasonable position and appear stable. Electronically Signed   By: David  Martinique M.D.   On: 06/24/2017 07:32  Dg Chest Port 1 View  Result Date: 06/23/2017 CLINICAL DATA:  Acute respiratory failure EXAM: PORTABLE CHEST 1 VIEW COMPARISON:  06/22/2017 FINDINGS:  Cardiac shadow is mildly enlarged. Postsurgical changes are again seen. A you right subclavian central line as well as left jugular central line are again seen and stable. Endotracheal tube and nasogastric catheter are noted and stable. The lungs are well aerated with persistent left-sided asymmetric edema and right basilar atelectasis. No new focal abnormality is noted. IMPRESSION: No change from the prior exam. Electronically Signed   By: Inez Catalina M.D.   On: 06/23/2017 07:24    Cardiac Studies   ECHO:  06/20/17  - Left ventricle: The cavity size was normal. There was moderate   concentric hypertrophy. Systolic function was normal. The   estimated ejection fraction was in the range of 60% to 65%. Wall   motion was normal; there were no regional wall motion   abnormalities. The study was not technically sufficient to allow   evaluation of LV diastolic dysfunction due to atrial   fibrillation. - Aortic valve: There was mild stenosis. Mean gradient (S): 12 mm   Hg. Peak gradient (S): 22 mm Hg. Valve area (VTI): 1.82 cm^2.   Valve area (Vmax): 1.49 cm^2. Valve area (Vmean): 1.56 cm^2. - Mitral valve: Structurally normal valve. - Right ventricle: The cavity size was mildly dilated. Wall   thickness was normal. Systolic function was mildly reduced. - Right atrium: The atrium was normal in size. - Tricuspid valve: There was no regurgitation. - Pulmonic valve: There was no regurgitation. - Inferior vena cava: The vessel was normal in size. - Pericardium, extracardiac: There was no pericardial effusion.  Patient Profile     76 y.o. male with history of hypertension, hyperlipidemia, type 2 diabetes mellitus, and probable CKD stage III, presenting with NSTEMI and acute systolic heart failure. 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  Status post CABG. Complicated post op course with respiratory failure.  AMS with possible anoxic vs embolic injury, shock, anuric renal  failure.   Assessment & Plan    NEURO:  MRI to be obtained.  Likely not until the end of the week.   No improvement clinically.  Continuing supportive care.   ATRIAL FIB:   Tachycardia.  Probably flutter with rate control.  Continue IV amiodarone.   SHOCK:  Continued shock on Vasopressin, norepi, milrinone.  CVP 9.   CoOx 56.8  ANURIC RENAL FAILURE:  CRRT.  Seems to have good volume control with this.  Plan per renal.   VDRF:  Per CCM    Signed, Minus Breeding, MD  06/24/2017, 7:56 AM

## 2017-06-24 NOTE — Progress Notes (Signed)
Post filter solution on CVVH changed to bicarb per Dr Kathrene BongoGoldsborough.Bicarb on abg is 12.9 after 200meq pushes of bicarb.

## 2017-06-24 NOTE — Progress Notes (Signed)
eLink Physician-Brief Progress Note Patient Name: Elam DutchRobert Ashbaugh DOB: January 05, 1941 MRN: [id number]   Date of Service  06/24/2017  HPI/Events of Note  Hypotension - BP = 78/19 with MAP = 30 by aline.   eICU Interventions  Will order: 1. Increase ceiling on Norepinephrine IV infusion to 50 mcg/min.     Intervention Category Major Interventions: Hypotension - evaluation and management  Sommer,Steven Eugene 06/24/2017, 10:02 PM

## 2017-06-24 NOTE — Progress Notes (Signed)
[facility] Rounding Note  Subjective:    Remains anuric on CRRT (started 8/2), no issues with CRRT, keeping even Weight down 130.1 > 104  On heparin for ischemic vs embolic lesions seen on CT Amio for AFF   Objective Vital signs in last 24 hours: Vitals:   06/24/17 0645 06/24/17 0800 06/24/17 0808 06/24/17 0830  BP: (!) 128/97 102/65  93/64  Pulse: (!) 115 (!) 116  (!) 116  Resp: (!) 28 (!) 27  (!) 27  Temp:   99 F (37.2 C)   TempSrc:   Oral   SpO2: 100% 100%  100%  Weight:      Height:       Weight change: 3 lb 1.4 oz (1.4 kg)  Intake/Output Summary (Last 24 hours) at 06/24/17 0834 Last data filed at 06/24/17 0800  Gross per 24 hour  Intake          3932.16 ml  Output             4423 ml  Net          -490.84 ml   Physical Exam: CVP reported as 8-10 by RN, recorded as 17 Intubated ETT, R [place] TLC, L  IJ HD cath, OGT, flexiseal Regular, tachy  Sternotomy scar healing/clean and dry.   Coarse BS but overall fairly clear Obese abdomen, not tender  Minimal if any edema now Feet cool Toes bilaterally with some purplish discoloration/sl  HD Access- left IJ vascath placed 8/2 R [place] line (8/7) Neurologically moving around more on the bed, hands mittened but at times looks like trying to remove the mittens. Nodded to questions today   Recent Labs Lab 06/23/17 0258 06/23/17 1722 06/24/17 0525  NA 136 135 136  K 4.4 4.4 4.8  CL 99* 99* 98*  CO2 26 22 19*  GLUCOSE 294* 339* 192*  BUN 43* 41* 33*  CREATININE 1.75* 1.83* 1.73*  CALCIUM 8.6* 8.5* 8.9  PHOS 2.2*  2.2* 3.8 4.1    Recent Labs Lab 06/18/17 0405  06/19/17 0419  06/23/17 0258 06/23/17 1722 06/24/17 0525  AST 56*  --  50*  --   --   --   --   ALT 19  --  22  --   --   --   --   ALKPHOS 325*  --  299*  --   --   --   --   BILITOT 1.1  --  1.1  --   --   --   --   PROT 6.1*  --  6.2*  --   --   --   --   ALBUMIN 3.2*  < > 3.4*  3.4*  < > 3.8 3.5 3.9  < > = values in this interval not  displayed.   Recent Labs Lab 06/20/17 0359 06/21/17 0430 06/22/17 0315 06/23/17 0258 06/24/17 0525  WBC 24.4* 31.7* 35.5* 35.6* 41.4*  HGB 8.3* 8.5* 7.7* 8.4* 10.2*  HCT 26.1* 26.8* 24.8* 26.3* 31.8*  MCV 90.0 89.3 90.2 88.6 90.9  PLT 234 258 259 259 244    Recent Labs Lab 06/23/17 0752 06/23/17 1139 06/23/17 2021 06/23/17 2347 06/24/17 0441  GLUCAP 224* 181* 254* 212* 181*    Studies/Results: Dg Chest Port 1 View  Result Date: 06/24/2017 CLINICAL DATA:  Status post CABG on June 09, 2017. Intubated patient with respiratory failure. EXAM: PORTABLE CHEST 1 VIEW COMPARISON:  Portable chest x-ray of June 23, 2017 FINDINGS: Right lung remains mildly  hypoinflated is compared to the left. The interstitial markings are increased and more conspicuous today greatest on the left. Small amounts of pleural fluid blunt the right lateral costophrenic angle. There is no pneumothorax. The cardiac silhouette is enlarged. The pulmonary vascularity is indistinct. A dual-lumen dialysis catheter tip projects at the junction of the right and left brachiocephalic veins. A right subclavian venous catheter tip projects over the lower portion of the SVC. The endotracheal tube tip lies approximately 4.2 cm above the carina. The esophagogastric tube tip and proximal port project below the inferior aspect of the image. IMPRESSION: Slight interval worsening in the appearance of the pulmonary interstitium may reflect pulmonary edema. ARDS could present in a similar fashion. Post CABG changes. The support tubes are in reasonable position and appear stable. Electronically Signed   By: David  Martinique M.D.   On: 06/24/2017 07:32   Dg Chest Port 1 View  Result Date: 06/23/2017 CLINICAL DATA:  Acute respiratory failure EXAM: PORTABLE CHEST 1 VIEW COMPARISON:  06/22/2017 FINDINGS: Cardiac shadow is mildly enlarged. Postsurgical changes are again seen. A you right subclavian central line as well as left jugular central  line are again seen and stable. Endotracheal tube and nasogastric catheter are noted and stable. The lungs are well aerated with persistent left-sided asymmetric edema and right basilar atelectasis. No new focal abnormality is noted. IMPRESSION: No change from the prior exam. Electronically Signed   By: Inez Catalina M.D.   On: 06/23/2017 07:24   Medications: Infusions: . albumin human    . albumin human    . amiodarone 30 mg/hr (06/24/17 0700)  . heparin 900 Units/hr (06/24/17 0700)  . lactated ringers    . lactated ringers Stopped (07/02/2017 0400)  . milrinone 0.25 mcg/kg/min (06/24/17 0700)  . norepinephrine (LEVOPHED) Adult infusion 29.973 mcg/min (06/24/17 0700)  . piperacillin-tazobactam Stopped (06/24/17 0022)  . dialysis replacement fluid (prismasate) 300 mL/hr at 06/23/17 1703  . dialysis replacement fluid (prismasate) 300 mL/hr at 06/23/17 1703  . dialysate (PRISMASATE) 1,500 mL/hr at 06/24/17 4656  . propofol (DIPRIVAN) infusion Stopped (06/20/17 0830)  . sodium chloride    . sodium phosphate  Dextrose 5% IVPB 30 mmol (06/23/17 1019)  . vancomycin Stopped (06/23/17 1351)  . vasopressin (PITRESSIN) infusion - *FOR SHOCK* 0.03 Units/min (06/24/17 0700)    Scheduled Medications: . aspirin EC  325 mg Oral Daily   Or  . aspirin  324 mg Per Tube Daily  . atorvastatin  80 mg Per Tube q1800  . chlorhexidine gluconate (MEDLINE KIT)  15 mL Mouth Rinse BID  . Chlorhexidine Gluconate Cloth  6 each Topical q morning - 10a  . darbepoetin (ARANESP) injection - DIALYSIS  200 mcg Intravenous Q Mon-HD  . feeding supplement (NEPRO CARB STEADY)  1,000 mL Per Tube Q24H  . feeding supplement (PRO-STAT SUGAR FREE 64)  60 mL Per Tube QID  . insulin aspart  0-24 Units Subcutaneous Q4H  . insulin glargine  50 Units Subcutaneous BID  . levalbuterol  0.63 mg Nebulization Q6H  . mouth rinse  15 mL Mouth Rinse 10 times per day  . pantoprazole sodium  40 mg Per Tube QHS  . sodium chloride flush   10-40 mL Intracatheter Q12H   Background 76 y.o. male PMH HTN, DM, OSA, chronic low back pain. Adm 7/25 with HF,  ^ trop., creatinine 1.4. Cath 7/27 (w/creatinine 1.96, improved to 1.1-1.2) ->3V ds. CABG 7/29 with progressive rise in creatinine post op, oliguria, failed extubation, marked volume overload.  Renal was asked to see. Failed lasix challenge, CRRT initiated 8/2 for AKI/massive anasarca.    Assessment/Plan:   AKI on CKD3 2/2 ischemic ATN post CABG.  Persistent anuric AKI/no evidence return fx CRRT started  8/2.   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      Acute encephalopathy EEG 8/7 demonstrating moderate diffuse slowing of electrocerebral activity CT 6/76 embolic vs ischemic lesions (+/- anoxic encephalopathy) IV heparin MRI when more stable (planned for first of the week) Neuro following Would not recommend CT scan with IV contrast given his ARF  Kalman Shan PGY-2 Internal Medicine Pager: [phone] 06/24/2017, 8:34 AM   I have seen and examined this patient and agree with plan and assessment in the above note with renal recommendations/intervention highlighted.   CVVHD prescription:  Pre-filter fluid: BGK 4/2.5 at 338m/hr, post-fiter fluid BGK 4/2.5 at 300  ml/hr, dialysate BGK 4/2.5 at 1,500 ml/min, on heparin, and no UF (keep even).  Continue with these settings for now.  JBroadus JohnA Gertie Broerman,MD 06/24/2017 2:08 PM

## 2017-06-24 NOTE — Progress Notes (Signed)
PULMONARY / CRITICAL CARE MEDICINE   Name: Philip DutchRobert Petersen MRN: [id number] DOB: 01-15-1941    ADMISSION DATE:  06/08/2017 CONSULTATION DATE:  06/11/2017  REFERRING MD:  Morton PetersVan Tright  CHIEF COMPLAINT:  Respiratory Decompensation/ vent management  Brief Patient Summary:   76 year old obese former smoker with OSA , and insulin-dependent diabetes [place] He presented to the ER with 2 weeks of progressive shortness of breath ankle swelling and chest discomfort. He had nonspecific EKG changes but was in sinus rhythm. Cardiac enzymes were mildly positive and chest x-ray showed mild CHF. [facility]. His creatinine was 2. Echocardiogram showed EF of 45%. No significant MR. Creatinine improved to 1.7 and he underwent coronary angiography and right heart cath. Coronary showed a 90% left main stenosis, proximal 80-90% LAD stenosis, 90% stenosis of the mid RCA and 80% stenosis of the proximal circumflex. LVEDP is 12. Right heart cath showed cardiac output of 5.5 L/m with normal right-sided pressures. The patient was admitted to the ICU placed on IV heparin and nitroglycerin. His creatinine post cath was 1.7.  Patient underwent CABG x 3 with TEE using Left internal mammary artery and right saphenous leg vein on 05/15/2017. He was recovering in the CVICU, was extubated 06/10/2017. He refused CPAP the night of 7/30-7/31> He was in atrial fibrillation and when bolused with Amio he had a rhythm change with long pauses and significant change in mental status requiring emergent re-intubation per Dr. Delton CoombesByrum 7/31. ABG showed a metabolic acidosis.( 7.311/ 41.5/390/22/ 21.1)  CCM consulted for vent management and evaluation of respiratory decompensation.   SUBJECTIVE:  RN reports pt received 1 unit PRBC, albumin overnight.  Remains on 30 mcg levophed, vasopressin  VITAL SIGNS: BP 102/65   Pulse (!) 116   Temp 99 F (37.2 C) (Oral)   Resp (!) 27   Ht 5\' 8"   (1.727 m)   Wt 230 lb 2.6 oz (104.4 kg)   SpO2 100%   BMI 35.00 kg/m   HEMODYNAMICS: CVP:  [0 mmHg-9 mmHg] 0 mmHg  VENTILATOR SETTINGS: Vent Mode: PRVC FiO2 (%):  [40 %-50 %] 40 % Set Rate:  [20 bmp-22 bmp] 20 bmp Vt Set:  [550 mL] 550 mL PEEP:  [5 cmH20] 5 cmH20 Plateau Pressure:  [18 cmH20-20 cmH20] 18 cmH20  INTAKE / OUTPUT: I/O last 3 completed shifts: In: 5281.8 [I.V.:1888.2; Blood:340; Other:447; NG/GT:1750; IV Piggyback:856.5] Out: 5450 [Other:4591; Stool:859]  PHYSICAL EXAMINATION:  General: ill appearing adult male on vent, NAD HEENT: MM pink/moist, ETT, some tracking this am Neuro: eyes open, tracking this am, attempts to raise arms to command, resists provider lifting arm CV: s1s2 rrr, no m/r/g PULM: even/non-labored, lungs bilaterally coarse WG:NFAOGI:soft, non-tender, bsx4 active  Extremities: warm/dry, no edema  Skin: no rashes or lesions  LABS:  BMET  Recent Labs Lab 06/23/17 0258 06/23/17 1722 06/24/17 0525  NA 136 135 136  K 4.4 4.4 4.8  CL 99* 99* 98*  CO2 26 22 19*  BUN 43* 41* 33*  CREATININE 1.75* 1.83* 1.73*  GLUCOSE 294* 339* 192*    Electrolytes  Recent Labs Lab 06/22/17 0315  06/23/17 0258 06/23/17 1722 06/24/17 0525  CALCIUM 9.1  < > 8.6* 8.5* 8.9  MG 2.6*  --  2.5*  --  3.0*  PHOS 2.4*  < > 2.2*  2.2* 3.8 4.1  < > = values in this interval not displayed.  CBC  Recent Labs Lab 06/22/17 0315 06/23/17 0258 06/24/17  0525  WBC 35.5* 35.6* 41.4*  HGB 7.7* 8.4* 10.2*  HCT 24.8* 26.3* 31.8*  PLT 259 259 244    Coag's No results for input(s): APTT, INR in the last 168 hours.  Sepsis Markers No results for input(s): LATICACIDVEN, PROCALCITON, O2SATVEN in the last 168 hours.  ABG  Recent Labs Lab 06/22/17 2227 06/24/17 0620  PHART 7.407 7.367  PCO2ART 38.6 32.7  PO2ART 105.0 65.0*    Liver Enzymes  Recent Labs Lab 06/18/17 0405  06/19/17 0419  06/23/17 0258 06/23/17 1722 06/24/17 0525  AST 56*  --  50*  --    --   --   --   ALT 19  --  22  --   --   --   --   ALKPHOS 325*  --  299*  --   --   --   --   BILITOT 1.1  --  1.1  --   --   --   --   ALBUMIN 3.2*  < > 3.4*  3.4*  < > 3.8 3.5 3.9  < > = values in this interval not displayed.  Cardiac Enzymes No results for input(s): TROPONINI, PROBNP in the last 168 hours.  Glucose  Recent Labs Lab 06/23/17 0345 06/23/17 0752 06/23/17 1139 06/23/17 2021 06/23/17 2347 06/24/17 0441  GLUCAP 288* 224* 181* 254* 212* 181*    Imaging Dg Chest Port 1 View  Result Date: 06/24/2017 CLINICAL DATA:  Status post CABG on June 09, 2017. Intubated patient with respiratory failure. EXAM: PORTABLE CHEST 1 VIEW COMPARISON:  Portable chest x-ray of June 23, 2017 FINDINGS: Right lung remains mildly hypoinflated is compared to the left. The interstitial markings are increased and more conspicuous today greatest on the left. Small amounts of pleural fluid blunt the right lateral costophrenic angle. There is no pneumothorax. The cardiac silhouette is enlarged. The pulmonary vascularity is indistinct. A dual-lumen dialysis catheter tip projects at the junction of the right and left brachiocephalic veins. A right subclavian venous catheter tip projects over the lower portion of the SVC. The endotracheal tube tip lies approximately 4.2 cm above the carina. The esophagogastric tube tip and proximal port project below the inferior aspect of the image. IMPRESSION: Slight interval worsening in the appearance of the pulmonary interstitium may reflect pulmonary edema. ARDS could present in a similar fashion. Post CABG changes. The support tubes are in reasonable position and appear stable. Electronically Signed   By: David  [place] M.D.   On: 06/24/2017 07:32    STUDIES:  ECHO 7/26 >> EF 40-45%, Moderate LVH, Mildly to moderately  reduced systolic dysfunction, MV mild regurgitation, LA mildly dilated LHC/RHC 7/27 >> 90% left main stenosis, proximal 80-90% LAD stenosis, 90%  stenosis of the mid RCA and 80% stenosis of the proximal circumflex. LVEDP is 12, cardiac output of 5.5 L/m with normal right-sided pressures. EEG 8/7 >> moderate diffuse slowing, no focal, hemispheric or lateralizing features, no epileptiform activity  CT head 8/7 >> No acute large vascular territory infarction, intracranial hemorrhage, or focal mass effect; hypoattenuation bilateral medial basal ganglia can be associated/w anoxic or metabolic injury, age indeterminate; right inferior cerebellar lucency c/w age-indeterminate lacunar infarction; nonspecific foci of hypoattenuation in white matter compatible/w microvascular ischemic changes; mild brain parenchymal volume loss. ECHO 8/9 >> LVEF 606-65%, no wall motion abnormalities, mild AS, RV mildly dilated RUQ [place] 8/13 >>   CULTURES: 7/27 MRSA PCR >> neg 7/27 surgical screen- urine >> MSSA postive Sputum 7/31>> multiple  organism present, none predominant Respiratory 8/3 >> normal Trach asp 8/5 >> negative  BC 8/7 >> negative Trach asp 8/7 >> negative  UC 8/7 >> neg Cdif 8/8 >> neg BCx2 8/13 >>  Sputum 8/13 >>   ANTIBIOTICS: Ceftazidine 7/31>> 8/7 Zinacef 7/29 preop Zosyn 8/7 >> Vanc 8/7 >> Diflucan 8/10 >>   SIGNIFICANT EVENTS: 7/29  CABG 7/30  Extubation 7/31  Emergent re-intubation for mixed respiratory/metabolic acidosis 8/01  Self extubated -> BiPAP 8/02  CRRT 8/11  No sedation in over a week, not waking  LINES/TUBES: R IJ Swan 7/29 >> 8/6 Left & Mediastinal CTc>> 7/29 R Radial A Line 7/29 >> 8/1 ETT 7/31 >> 8/1 Left femoral Aline 8/1 >> 8/6 L IJ HD cath 8/2 >>  R SVC CVL 8/7 >>    DISCUSSION: 76 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  Post-op course complicated by prolonged vent needs, AKI on CVVHD & AF.  ASSESSMENT / PLAN:  PULMONARY A: Acute hypoxic respiratory failure Mixed Respiratory/Metabolic Acidosis   R/o HCAP/ LLL opacity vs pulmonary edema Suspected OSA/OHS  P:   PRVC 8 cc/kg  Wean PEEP / FiO2 for sats > 92% Trend CXR  Will need CPAP / BiPAP post extubation  Daily SBT / WUA  See ID   CARDIOVASCULAR A:  Cardiogenic shock Afib/ Bradycardia - on Amiodarone  CAD s/p CABGx3 7/29 Frequent ectopy P: ICU monitoring of hemodynamics Post-op care per CVTS Levophed, milrinone  Trend Co-Ox  RENAL A:   Acute on Chronic Kidney Disease  - oliguric NAGMA - resolved Hypermagnesemia Hypophosphatemia P:   Trend BMP / urinary output Replace electrolytes as indicated Avoid nephrotoxic agents, ensure adequate renal perfusion CVVHD per Nephrology, even balance  Appears euvolemic  GASTROINTESTINAL A:   At Risk Protein Calorie Malnutrition  P:   PPI for SUP  Bowel regimen  TF per nutrition   HEMATOLOGIC A:   Anemia - likely from phlebotomy, loss with filters, no evidence of acute bleeding P:  Trend CBC Transfuse for Hgb <8% SCD's for DVT prophylaxis   INFECTIOUS A:   ? 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   P:   RASS Goal:  0 Neurology following > rec's for MRI when able, waiting one week for neuro assessment  Minimize sedation as able  PRN fentanyl for clenching / ETT protection Await MRI   FAMILY  - Updates: No family at bedside am 8/13.  Prior family conference held with Dr. Donata Clay, pending neuro exam, family  does not wish for prolonged aggressive therapies if no reasonable chance of recovery.    - Inter-disciplinary family meet or Palliative Care meeting due - ongoing    CC Time: 30 minutes  Canary Brim, NP-C [facility] Pgr: [phone]  or if no answer [phone] 06/24/2017, 8:40 AM

## 2017-06-24 NOTE — Progress Notes (Signed)
STROKE TEAM PROGRESS NOTE   CC: Abnormal neurological exam  History is obtained from: Chart review  HPI: Philip Love is a 76 y.o. male with HTN, DM, OSA, chronic low back pain. Adm 7/25 with HF,  ^ trop., creatinine 1.4. Cath 7/27 (w/creatinine 1.96, improved to 1.1-1.2) ->3V ds. CABG 7/29 with progressive rise in creatinine post op, oliguria, failed extubation, marked volume overload. Marland Kitchen He underwent CABG on 7/29 and was recovering and extubated on 7/30. He did have an episode on 7/31 with long pauses and altered mental status after being bolused with amiodarone. He was intubated emergently at that time. He was sedated following this, but sedation was turned off on 8/6 and he has not significantly improved. Overnight, it was noted that he was posturing bilaterally and therefore neurology has been consulted.   LKW: Unclear tpa given?: no, recent surgery  Progress Note:  Pt is able to tolerate CRRT. He is currently on heparin for ischemic vs embolic lesions seen on CT.   ROS :Review of Systems  Unable to perform ROS: Critical illness     MEDICATIONS . aspirin EC  325 mg Oral Daily   Or  . aspirin  324 mg Per Tube Daily  . atorvastatin  80 mg Per Tube q1800  . chlorhexidine gluconate (MEDLINE KIT)  15 mL Mouth Rinse BID  . Chlorhexidine Gluconate Cloth  6 each Topical q morning - 10a  . darbepoetin (ARANESP) injection - DIALYSIS  200 mcg Intravenous Q Mon-HD  . [START ON 07-Jul-2017] feeding supplement (NEPRO CARB STEADY)  1,000 mL Per Tube Q24H  . feeding supplement (PRO-STAT SUGAR FREE 64)  60 mL Per Tube QID  . insulin aspart  0-24 Units Subcutaneous Q4H  . insulin glargine  50 Units Subcutaneous BID  . levalbuterol  0.63 mg Nebulization Q6H  . mouth rinse  15 mL Mouth Rinse 10 times per day  . pantoprazole sodium  40 mg Per Tube QHS  . sodium chloride flush  10-40 mL Intracatheter Q12H   . albumin human    . amiodarone 30 mg/hr (06/24/17 1638)  . epinephrine    .  heparin 900 Units/hr (06/24/17 0700)  . lactated ringers    . lactated ringers Stopped (06/24/2017 0400)  . meropenem (MERREM) IV    . milrinone 0.25 mcg/kg/min (06/24/17 1638)  . norepinephrine (LEVOPHED) Adult infusion 30 mcg/min (06/24/17 1638)  . dialysis replacement fluid (prismasate) 300 mL/hr at 06/24/17 1737  . dialysis replacement fluid (prismasate) 300 mL/hr at 06/24/17 1737  . dialysate (PRISMASATE) 1,500 mL/hr at 06/24/17 1737  . propofol (DIPRIVAN) infusion Stopped (06/20/17 0830)  . sodium chloride    . sodium phosphate  Dextrose 5% IVPB Stopped (06/24/17 1552)  . vancomycin 750 mg (06/24/17 1130)  . vasopressin (PITRESSIN) infusion - *FOR SHOCK* 0.03 Units/min (06/24/17 0700)   acetaminophen, fentaNYL (SUBLIMAZE) injection, heparin, ondansetron (ZOFRAN) IV, sodium chloride, sodium chloride flush    CBC:   Recent Labs Lab 06/23/17 0258 06/24/17 0525  WBC 35.6* 41.4*  HGB 8.4* 10.2*  HCT 26.3* 31.8*  MCV 88.6 90.9  PLT 259 740    Basic Metabolic Panel:   Recent Labs Lab 06/23/17 0258  06/24/17 0525 06/24/17 1600  NA 136  < > 136 133*  K 4.4  < > 4.8 6.1*  CL 99*  < > 98* 96*  CO2 26  < > 19* 8*  GLUCOSE 294*  < > 192* 277*  BUN 43*  < > 33* 37*  CREATININE  1.75*  < > 1.73* 2.37*  CALCIUM 8.6*  < > 8.9 8.4*  MG 2.5*  --  3.0*  --   PHOS 2.2*  2.2*  < > 4.1 11.7*  < > = values in this interval not displayed.  Lipid Panel:     Component Value Date/Time   CHOL 48 06/23/2017 0258   TRIG 165 (H) 06/23/2017 0258   HDL 12 (L) 06/23/2017 0258   CHOLHDL 4.0 06/23/2017 0258   VLDL 33 06/23/2017 0258   LDLCALC 3 06/23/2017 0258   HgbA1c:  Lab Results  Component Value Date   HGBA1C 5.8 (H) 06/23/2017   Urine Drug Screen: No results found for: LABOPIA, COCAINSCRNUR, LABBENZ, AMPHETMU, THCU, LABBARB  Alcohol Level No results found for: ETH  Gen: In bed, NAD Resp: non-labored breathing, no acute distress Abd: soft, nt  Neuro: MS: eyes partially  open at rest, does not follow commands, he grimaces and moves his head in response to noxious stimuli.  CN:PERRL, eyes dysconjugate, corneals intact, cough intact.  Motor: He at times withdraws and at times extensor postures in bilateral upper extremities.  Sensory:he does respond to pain x 4   Impression: 75-year-old male with decreased mental status and posturing. Possibilities include embolic shower versus anoxic brain injury.  His exam is unusual, and though he is posturing, he has cortically driven movements as well. I'm not certain I can predict with any degree of certainty what his likelihood of recovery is, but he could have improvement to the point of interactiveness and possibly even independent living, but I think that this would be a long-term recovery rather than any expectation of rapid improvement in short-term.  MRI  Is pending as it needs to be 3 weeks post CABG.   Signed Parth Saraiya MD [facility] Stroke Team    

## 2017-06-24 NOTE — Progress Notes (Signed)
Milrinone drip increased to 0.375 per dr Antoine PocheHochrein for venous oxygen saturation of 37, down from 56 .

## 2017-06-25 ENCOUNTER — Inpatient Hospital Stay (HOSPITAL_COMMUNITY): Payer: Medicare Other

## 2017-06-25 LAB — COMPREHENSIVE METABOLIC PANEL
ALT: 5276 U/L — ABNORMAL HIGH (ref 17–63)
AST: >10000 U/L — ABNORMAL HIGH (ref 15–41)
Albumin: 3.7 g/dL (ref 3.5–5.0)
Alkaline Phosphatase: 1241 U/L — ABNORMAL HIGH (ref 38–126)
Anion gap: 32 — ABNORMAL HIGH (ref 5–15)
BUN: 23 mg/dL — ABNORMAL HIGH (ref 6–20)
CO2: 10 mmol/L — ABNORMAL LOW (ref 22–32)
Calcium: 7.4 mg/dL — ABNORMAL LOW (ref 8.9–10.3)
Chloride: 97 mmol/L — ABNORMAL LOW (ref 101–111)
Creatinine, Ser: 1.99 mg/dL — ABNORMAL HIGH (ref 0.61–1.24)
GFR calc Af Amer: 37 mL/min — ABNORMAL LOW (ref >60)
GFR calc non Af Amer: 32 mL/min — ABNORMAL LOW (ref >60)
Glucose, Bld: 42 mg/dL — CL (ref 65–99)
Potassium: 6.9 mmol/L (ref 3.5–5.1)
Sodium: 139 mmol/L (ref 135–145)
Total Bilirubin: 5.2 mg/dL — ABNORMAL HIGH (ref 0.3–1.2)
Total Protein: 6.3 g/dL — ABNORMAL LOW (ref 6.5–8.1)

## 2017-06-25 LAB — POCT I-STAT 3, ART BLOOD GAS (G3+)
ACID-BASE DEFICIT: 16 mmol/L — AB (ref 0.0–2.0)
Acid-base deficit: 17 mmol/L — ABNORMAL HIGH (ref 0.0–2.0)
BICARBONATE: 10.5 mmol/L — AB (ref 20.0–28.0)
Bicarbonate: 11.1 mmol/L — ABNORMAL LOW (ref 20.0–28.0)
O2 SAT: 85 %
O2 Saturation: 89 %
PCO2 ART: 24.4 mmHg — AB (ref 32.0–48.0)
PCO2 ART: 26.8 mmHg — AB (ref 32.0–48.0)
PH ART: 7.241 — AB (ref 7.350–7.450)
Patient temperature: 32.7
Patient temperature: 34.1
TCO2: 11 mmol/L (ref 0–100)
TCO2: 12 mmol/L (ref 0–100)
pH, Arterial: 7.186 — CL (ref 7.350–7.450)
pO2, Arterial: 51 mmHg — ABNORMAL LOW (ref 83.0–108.0)
pO2, Arterial: 52 mmHg — ABNORMAL LOW (ref 83.0–108.0)

## 2017-06-25 LAB — BPAM RBC
BLOOD PRODUCT EXPIRATION DATE: 201808202359
ISSUE DATE / TIME: 201808130054
UNIT TYPE AND RH: 5100

## 2017-06-25 LAB — RENAL FUNCTION PANEL
ALBUMIN: 3.6 g/dL (ref 3.5–5.0)
Anion gap: 31 — ABNORMAL HIGH (ref 5–15)
BUN: 23 mg/dL — AB (ref 6–20)
CO2: 11 mmol/L — ABNORMAL LOW (ref 22–32)
CREATININE: 2.01 mg/dL — AB (ref 0.61–1.24)
Calcium: 7.4 mg/dL — ABNORMAL LOW (ref 8.9–10.3)
Chloride: 97 mmol/L — ABNORMAL LOW (ref 101–111)
GFR calc Af Amer: 36 mL/min — ABNORMAL LOW (ref >60)
GFR, EST NON AFRICAN AMERICAN: 31 mL/min — AB (ref >60)
Glucose, Bld: 42 mg/dL — CL (ref 65–99)
PHOSPHORUS: 12.1 mg/dL — AB (ref 2.5–4.6)
Potassium: 6.9 mmol/L (ref 3.5–5.1)
Sodium: 139 mmol/L (ref 135–145)

## 2017-06-25 LAB — POCT I-STAT, CHEM 8
BUN: 32 mg/dL — ABNORMAL HIGH (ref 6–20)
BUN: 29 mg/dL — AB (ref 6–20)
CALCIUM ION: 0.8 mmol/L — AB (ref 1.15–1.40)
CHLORIDE: 101 mmol/L (ref 101–111)
CREATININE: 1.9 mg/dL — AB (ref 0.61–1.24)
CREATININE: 1.9 mg/dL — AB (ref 0.61–1.24)
Calcium, Ion: 0.82 mmol/L — CL (ref 1.15–1.40)
Chloride: 95 mmol/L — ABNORMAL LOW (ref 101–111)
GLUCOSE: 35 mg/dL — AB (ref 65–99)
Glucose, Bld: 96 mg/dL (ref 65–99)
HCT: 24 % — ABNORMAL LOW (ref 39.0–52.0)
HCT: 21 % — ABNORMAL LOW (ref 39.0–52.0)
HEMOGLOBIN: 8.2 g/dL — AB (ref 13.0–17.0)
HEMOGLOBIN: 7.1 g/dL — AB (ref 13.0–17.0)
POTASSIUM: 6.3 mmol/L — AB (ref 3.5–5.1)
Potassium: 6.3 mmol/L (ref 3.5–5.1)
SODIUM: 136 mmol/L (ref 135–145)
SODIUM: 135 mmol/L (ref 135–145)
TCO2: 13 mmol/L (ref 0–100)
TCO2: 13 mmol/L (ref 0–100)

## 2017-06-25 LAB — TYPE AND SCREEN
ABO/RH(D): O POS
Antibody Screen: NEGATIVE
UNIT DIVISION: 0

## 2017-06-25 LAB — CORTISOL-AM, BLOOD: Cortisol - AM: 16.6 ug/dL (ref 6.7–22.6)

## 2017-06-25 LAB — CBC
HCT: 24.8 % — ABNORMAL LOW (ref 39.0–52.0)
HEMOGLOBIN: 7.4 g/dL — AB (ref 13.0–17.0)
MCH: 28.9 pg (ref 26.0–34.0)
MCHC: 29.8 g/dL — ABNORMAL LOW (ref 30.0–36.0)
MCV: 96.9 fL (ref 78.0–100.0)
Platelets: 183 10*3/uL (ref 150–400)
RBC: 2.56 MIL/uL — ABNORMAL LOW (ref 4.22–5.81)
RDW: 19.4 % — AB (ref 11.5–15.5)
WBC: 55.1 10*3/uL (ref 4.0–10.5)

## 2017-06-25 LAB — COOXEMETRY PANEL
Carboxyhemoglobin: 0.7 % (ref 0.5–1.5)
Methemoglobin: 2 % — ABNORMAL HIGH (ref 0.0–1.5)
O2 Saturation: 45.4 %
Total hemoglobin: 6.5 g/dL — CL (ref 12.0–16.0)

## 2017-06-25 LAB — GLUCOSE, CAPILLARY
GLUCOSE-CAPILLARY: 178 mg/dL — AB (ref 65–99)
Glucose-Capillary: 155 mg/dL — ABNORMAL HIGH (ref 65–99)

## 2017-06-25 LAB — MAGNESIUM: MAGNESIUM: 3 mg/dL — AB (ref 1.7–2.4)

## 2017-06-25 MED ORDER — PRISMASOL BGK 0/2.5 32-2.5 MEQ/L IV SOLN
INTRAVENOUS | Status: DC
  Administered 2017-06-25: 04:00:00 via INTRAVENOUS_CENTRAL
  Filled 2017-06-25 (×3): qty 5000

## 2017-06-25 MED ORDER — SODIUM BICARBONATE 8.4 % IV SOLN
INTRAVENOUS | Status: AC
  Filled 2017-06-25: qty 50

## 2017-06-25 MED ORDER — ALBUMIN HUMAN 5 % IV SOLN
12.5000 g | Freq: Once | INTRAVENOUS | Status: AC
  Administered 2017-06-25: 12.5 g via INTRAVENOUS

## 2017-06-25 MED ORDER — ALBUMIN HUMAN 5 % IV SOLN
INTRAVENOUS | Status: DC
Start: 2017-06-25 — End: 2017-06-25
  Filled 2017-06-25: qty 500

## 2017-06-25 MED ORDER — SODIUM POLYSTYRENE SULFONATE 15 GM/60ML PO SUSP
30.0000 g | Freq: Once | ORAL | Status: AC
  Administered 2017-06-25: 30 g
  Filled 2017-06-25: qty 120

## 2017-06-25 MED ORDER — SODIUM BICARBONATE 8.4 % IV SOLN
100.0000 meq | Freq: Once | INTRAVENOUS | Status: AC
  Administered 2017-06-25: 100 meq via INTRAVENOUS
  Filled 2017-06-25: qty 100

## 2017-06-25 MED ORDER — SODIUM BICARBONATE 8.4 % IV SOLN
50.0000 meq | Freq: Once | INTRAVENOUS | Status: AC
  Administered 2017-06-25: 50 meq via INTRAVENOUS

## 2017-06-25 MED ORDER — LEVALBUTEROL HCL 0.63 MG/3ML IN NEBU
0.6300 mg | INHALATION_SOLUTION | Freq: Four times a day (QID) | RESPIRATORY_TRACT | Status: DC
  Filled 2017-06-25: qty 3

## 2017-06-25 MED ORDER — PRISMASOL BGK 0/2.5 32-2.5 MEQ/L IV SOLN
INTRAVENOUS | Status: DC
  Administered 2017-06-25: 04:00:00 via INTRAVENOUS_CENTRAL
  Filled 2017-06-25 (×6): qty 5000

## 2017-06-25 MED ORDER — ALBUMIN HUMAN 5 % IV SOLN
INTRAVENOUS | Status: AC
  Administered 2017-06-25: 12.5 g via INTRAVENOUS
  Filled 2017-06-25: qty 500

## 2017-06-25 MED ORDER — ALBUMIN HUMAN 5 % IV SOLN
37.5000 g | Freq: Once | INTRAVENOUS | Status: AC
  Administered 2017-06-25: 37.5 g via INTRAVENOUS

## 2017-06-25 MED ORDER — AMIODARONE HCL IN DEXTROSE 360-4.14 MG/200ML-% IV SOLN
INTRAVENOUS | Status: AC
  Filled 2017-06-25: qty 200

## 2017-06-25 MED ORDER — ALBUMIN HUMAN 5 % IV SOLN
INTRAVENOUS | Status: AC
  Administered 2017-06-25: 37.5 g via INTRAVENOUS
  Filled 2017-06-25: qty 250

## 2017-06-25 MED ORDER — SODIUM CHLORIDE 0.9 % IV SOLN
1.0000 g | Freq: Once | INTRAVENOUS | Status: AC
  Administered 2017-06-25: 1 g via INTRAVENOUS
  Filled 2017-06-25: qty 10

## 2017-06-25 MED ORDER — DEXTROSE 50 % IV SOLN
INTRAVENOUS | Status: AC
  Administered 2017-06-25: 50 mL
  Filled 2017-06-25: qty 50

## 2017-06-29 LAB — CULTURE, BLOOD (ROUTINE X 2)
Culture: NO GROWTH
Culture: NO GROWTH
Special Requests: ADEQUATE
Special Requests: ADEQUATE

## 2017-07-03 ENCOUNTER — Encounter (HOSPITAL_COMMUNITY): Payer: Self-pay

## 2017-07-13 NOTE — Plan of Care (Signed)
To morgue

## 2017-07-13 NOTE — Progress Notes (Signed)
eLink Physician-Brief Progress Note Patient Name: [name] DOB: 03/27/1941 MRN: [id number]   Date of Service  06/17/2017  HPI/Events of Note  Septic + card shock - co-ox 37%  PH improved, bicarb bath being added to CVVH Ion Ca 0.81  eICU Interventions  Bicarb x 1 Calcium 1 g Then titrate epi to MAP Milrinone already at 0.375      Intervention Category Major Interventions: Acid-Base disturbance - evaluation and management;Hypotension - evaluation and management  Lorcan Shelp V. 06/13/2017, 12:02 AM

## 2017-07-13 NOTE — Progress Notes (Signed)
I've paged Dr. Donata ClayVan Trigt and Dr. Barbaraann Caoamseur.

## 2017-07-13 NOTE — Op Note (Signed)
NAME:  Philip Love, [name]                   ACCOUNT NO.:  MEDICAL RECORD NO.:  [id number]  LOCATION:                                 FACILITY:  PHYSICIAN:  Kerin PernaPeter Van Trigt, M.D.  DATE OF BIRTH:  11-Jul-1941  DATE OF PROCEDURE:  06/24/2017 DATE OF DISCHARGE:                              OPERATIVE REPORT   OPERATION:  Placement of hemodialysis catheter - Trialysis catheter in left subclavian vein.  SURGEON:  Kerin PernaPeter Van Trigt, M.D.  ANESTHESIA:  Local with 1% lidocaine.  PREOPERATIVE DIAGNOSIS:  Acute on chronic renal insufficiency, status post urgent coronary artery bypass graft.  POSTOPERATIVE DIAGNOSIS:  Acute on chronic renal insufficiency, status post urgent coronary artery bypass graft.  INDICATIONS FOR PROCEDURE:  The patient is in the ICU after urgent multivessel CABG for critical left main stenosis, unstable angina, and heart failure.  His preoperative creatinine was elevated. Postoperatively, he became anuric and required hemodialysis - CRT.  The dialysis catheter had been placed in the left IJ by the critical care team.  Over the past several days, the patient's white count has increased significantly and in an effort to reduce the risk of sepsis, his central lines have been changed.  I discussed changing the dialysis catheter with the patient's family, and informed consent was obtained. The old dialysis catheter was removed before the new clean catheter was placed.  DESCRIPTION OF PROCEDURE:  The patient's left anterior chest was prepped and draped as a sterile field.  A proper time-out was performed. Lidocaine 1% was infiltrated beneath the left clavicle.  Using the Seldinger technique, the left subclavian vein was cannulated and a guidewire was passed centrally.  Over the guidewire, 2 dilators and then the Trialysis catheter were passed without difficulty.  The catheter was flushed with saline and secured to the skin with silk suture.  A sterile dressing was applied  around the entry site including a Biopatch.  A followup chest x-ray showed the catheter to be in good position and CVVH dialysis was re-initiated.     Kerin PernaPeter Van Trigt, M.D.     PV/MEDQ  D:  06/20/2017  T:  06/28/2017  Job:  [id number]

## 2017-07-13 NOTE — Progress Notes (Signed)
   10/27/2017 0300  Clinical Encounter Type  Visited With Patient and family together;Health care provider  Visit Type Spiritual support  Referral From Nurse  Consult/Referral To Chaplain  Spiritual Encounters  Spiritual Needs Prayer;Emotional  Stress Factors  Patient Stress Factors None identified  Family Stress Factors Exhausted;Loss of control;Major life changes    Chaplain responded to page on unit for support to family that had made patient DNR. Patient seems to be resting comfortably. Son and wife at bedside, wife is teary. Son informed chaplain that they have been married 50 years. Son brought up that this is God's time not our time and that they are doing the best they can. Provided emotional support and ministry of presence to family. Discussed the importance of self-care because son was concerned that mom wasn't eating or drinking water. Posey Jasmin L. Salomon FickBanks, MDiv

## 2017-07-13 NOTE — Progress Notes (Signed)
Pt pronounced expired at 0734 by me and Baldomero LamyKeely White RN

## 2017-07-13 NOTE — Progress Notes (Signed)
Dr .Vassie LollAlva informed about patient's blood gas results

## 2017-07-13 NOTE — Discharge Summary (Signed)
NAMMancel Bale:  Deviney, Irven              ACCOUNT NO.:  [id number]  MEDICAL RECORD NO.:  [id number]  LOCATION:  2H06C                        FACILITY:  [facility]  PHYSICIAN:  Kerin PernaPeter Van Trigt, M.D.  DATE OF BIRTH:  1941/09/20  DATE OF ADMISSION:  09-Oct-2017 DATE OF DISCHARGE:  06/22/2017                              DISCHARGE SUMMARY   DEATH SUMMARY  ADMISSION DIAGNOSES: 1. Unstable angina with acute systolic heart failure secondary to non-     ST elevation myocardial infarction. 2. Morbid obesity. 3. Obstructive sleep apnea. 4. Hypertension. 5. Chronic renal failure. 6. Diabetes mellitus.  OPERATIONS AND PROCEDURES: 1. Right and left heart cardiac catheterization on June 07, 2017, by     Dr. Peter SwazilandJordan, demonstrating 90% left main stenosis, 80% to 90%     proximal RCA stenosis, ejection fraction 40%, pulmonary     hypertension with PA pressures 55/25. 2. Coronary artery bypass grafting x3 on June 09, 2017, by Dr. Donata ClayVan     Trigt (left IMA to LAD, saphenous vein graft to obtuse marginal,     saphenous vein graft to distal right coronary artery). 3. Re-intubation by Critical Care Medicine on June 11, 2017. 4. Initiation of CRT-CVVH dialysis after dialysis catheter placement     on June 13, 2017. 5. Self-extubation on June 12, 2017, with re-intubation on June 14, 2017. 6. Neurology consultation by Dr. Amada JupiterKirkpatrick on June 20, 2017, with     head CT and EEG.  HOSPITAL COURSE:  The patient is a 76 year old morbidly obese, diabetic with nephropathy, retinopathy, and severe CAD with unstable angina and symptoms of heart failure, who was admitted following a non-ST elevation MI.  His chest x-ray showed evidence of CHF.  His creatinine was 2.0. His echo showed EF of 45%.  [facility] he underwent cardiac catheterization with the above findings.  His cardiologist recommended coronary artery bypass grafting.  I evaluated the patient for urgent CABG based on  his critical left main stenosis and agreed with the recommendation for CABG.  I discussed the procedure in detail with the patient and his family including the expected benefits and the potential risks, especially pulmonary risks due to his morbid obesity and history of sleep apnea.  They also understood that he was at risk for acute on chronic renal failure, although we delayed surgery 48 hours after exposure to the IV contrast.  Informed consent was obtained.  The patient was taken to the operating room on June 09, 2017, underwent CABG x3.  He returned to the ICU stable on minimal inotropic support. He did not meet criteria for extubation due to hypercarbia until the next day.  He was extubated on postop day #1.  On postop day #2, he developed atrial fibrillation.  He had altered mental status after refusing to wear his CPAP mask the previous night.  After a dose of IV amiodarone, he became minimally responsive and required urgent re- intubation on postop day #2.  With reintubation his mental status normalized.  Chest x-ray appeared to be fairly clear.  He had decreasing urine output; however, immediately postop despite aggressive Lasix dosing and normal cardiac output.  On June 12, 2017, he self-extubated while his nurse was taking report.  He was then supported on BiPAP and followed by Critical Care Medicine for the next several days.  He required re-intubation because of inadequate pulmonary status with tachypnea and acute respiratory failure on June 14, 2017, by Critical Care Medicine.  Critical Care Medicine placed a dialysis catheter on June 13, 2017, and he was started on CVVH for acute on chronic renal failure.  The patient remained on high-dose sedation for the next 3 days after his second re-intubation.  Blood pressure remained stable.  He never received CPR or coded during his two re-intubation episodes.  It was noted he had altered mental status with decreased level  of responsiveness and sedation was held.  After 48 hours; however, his level of conscious was still reduced and head CT scan was performed, which showed no stroke.  The EEG was performed, which showed metabolic encephalopathy.  Neurology consultation was obtained by Dr. Amada Jupiter, who recommended an MRI of the brain.  However, this was not possible due to the patient's recent CABG and presence of wires in the chest.  The patient was placed on antibiotics for tracheal aspirate showing Gram- positive cocci.  His temperature curve remained normal.  Surgical incisions remain clean and dry.  However, his white count continued to increase.  He was treated with empiric antibiotics.  His renal status remained poor and he was anuric, but CVVH was successful in infiltration and treating his uremia.  A postop echocardiogram showed normal LV function without pericardial effusion.  For the next few days, the patient developed increasing white count, increased pressor or support, and 36 hours prior to death developed metabolic acidosis.  Abdominal ultrasound showed the presence of gallstones, but no evidence of gallbladder infection.  Blood cultures and sputum cultures remain negative.  The patient 12 hours prior to death had developed more hemodynamic instability and vasopressin and pressors were added.  LFTs were checked and were extremely elevated consistent with acute hepatic failure.  Because of the patient's progressive ongoing multiorgan system failure, the Critical Care Service discussed the patient's condition with family and a DNR-palliative care order was placed.  The patient subsequently expired peacefully on the morning of June 25, 2017.  FINAL DIAGNOSES: 1. Non-ST elevation myocardial infarction, moderate LV dysfunction,     severe three-vessel coronary artery disease with 90% left main     stenosis. 2. Diabetes mellitus. 3. Obstructive sleep apnea. 4. Morbid obesity. 5.  Acute on chronic renal failure. 6. Postoperative atrial fibrillation. 7. Postoperative encephalopathy with possible embolic cerebrovascular     accident by CT scan versus anoxic brain injury. 8. Postoperative sepsis with rising white count, metabolic acidosis,     and pressor requirement. 9. Multiorgan failure with subsequent DNR and expiration on June 25, 2017.    Kerin Perna, M.D.    PV/MEDQ  D:  07/10/2017  T:  06/14/2017  Job:  [id number]

## 2017-07-13 NOTE — Progress Notes (Signed)
eLink Physician-Brief Progress Note Patient Name: Philip DutchRobert Pinela DOB: 11-24-1940 MRN: [id number]   Date of Service  06/19/2017  HPI/Events of Note  Runs of NSVT  eICU Interventions  Resume amio gtt - stopped earlier due to brady     Intervention Category Major Interventions: Arrhythmia - evaluation and management  ALVA,RAKESH V. 07/06/2017, 2:40 AM

## 2017-07-13 NOTE — Progress Notes (Signed)
RT note-patient removed from ventilator, deceased.

## 2017-07-13 NOTE — Progress Notes (Signed)
Hemoglobin of 7.4 called in to Dr Vassie LollAlva of [facility]

## 2017-07-13 NOTE — Progress Notes (Addendum)
eLink Physician-Brief Progress Note Patient Name: Philip DutchRobert Love DOB: [birth year] MRN: [id number]   Date of Service  2016/11/16  HPI/Events of Note  Severe acidosis K 6.2 , ion Ca 0.80  eICU Interventions  Bicarb x 2 amp Calcium x 1 Kayexalate x 1 Changes made to CVVH     Intervention Category Major Interventions: Acid-Base disturbance - evaluation and management;Electrolyte abnormality - evaluation and management  Philip Love V. 2016/11/16, 4:15 AM

## 2017-07-13 NOTE — Progress Notes (Signed)
Amp of d50 given for blood glucose of 35

## 2017-07-13 NOTE — Progress Notes (Signed)
Seen and examined. Labs reviewed. Family bedside. Patient has been having runs of Vtach and is maxed on 3 pressors. Given multiple NaHCO3 boluses with severe hypoxemia and acidemia. Hypothermic  And mottled. Discussed with family bedside and family would not want him to be resuscitated.  Patient is DNR. Will give a dose of lidocaine.

## 2017-07-13 DEATH — deceased

## 2018-11-24 IMAGING — DX DG CHEST 1V PORT
1 series · 1 of 1 positions shown · non-contrast
Comparison: 06/05/2017

CLINICAL DATA: Angina at rest

EXAM:
PORTABLE CHEST 1 VIEW

[chest ap]
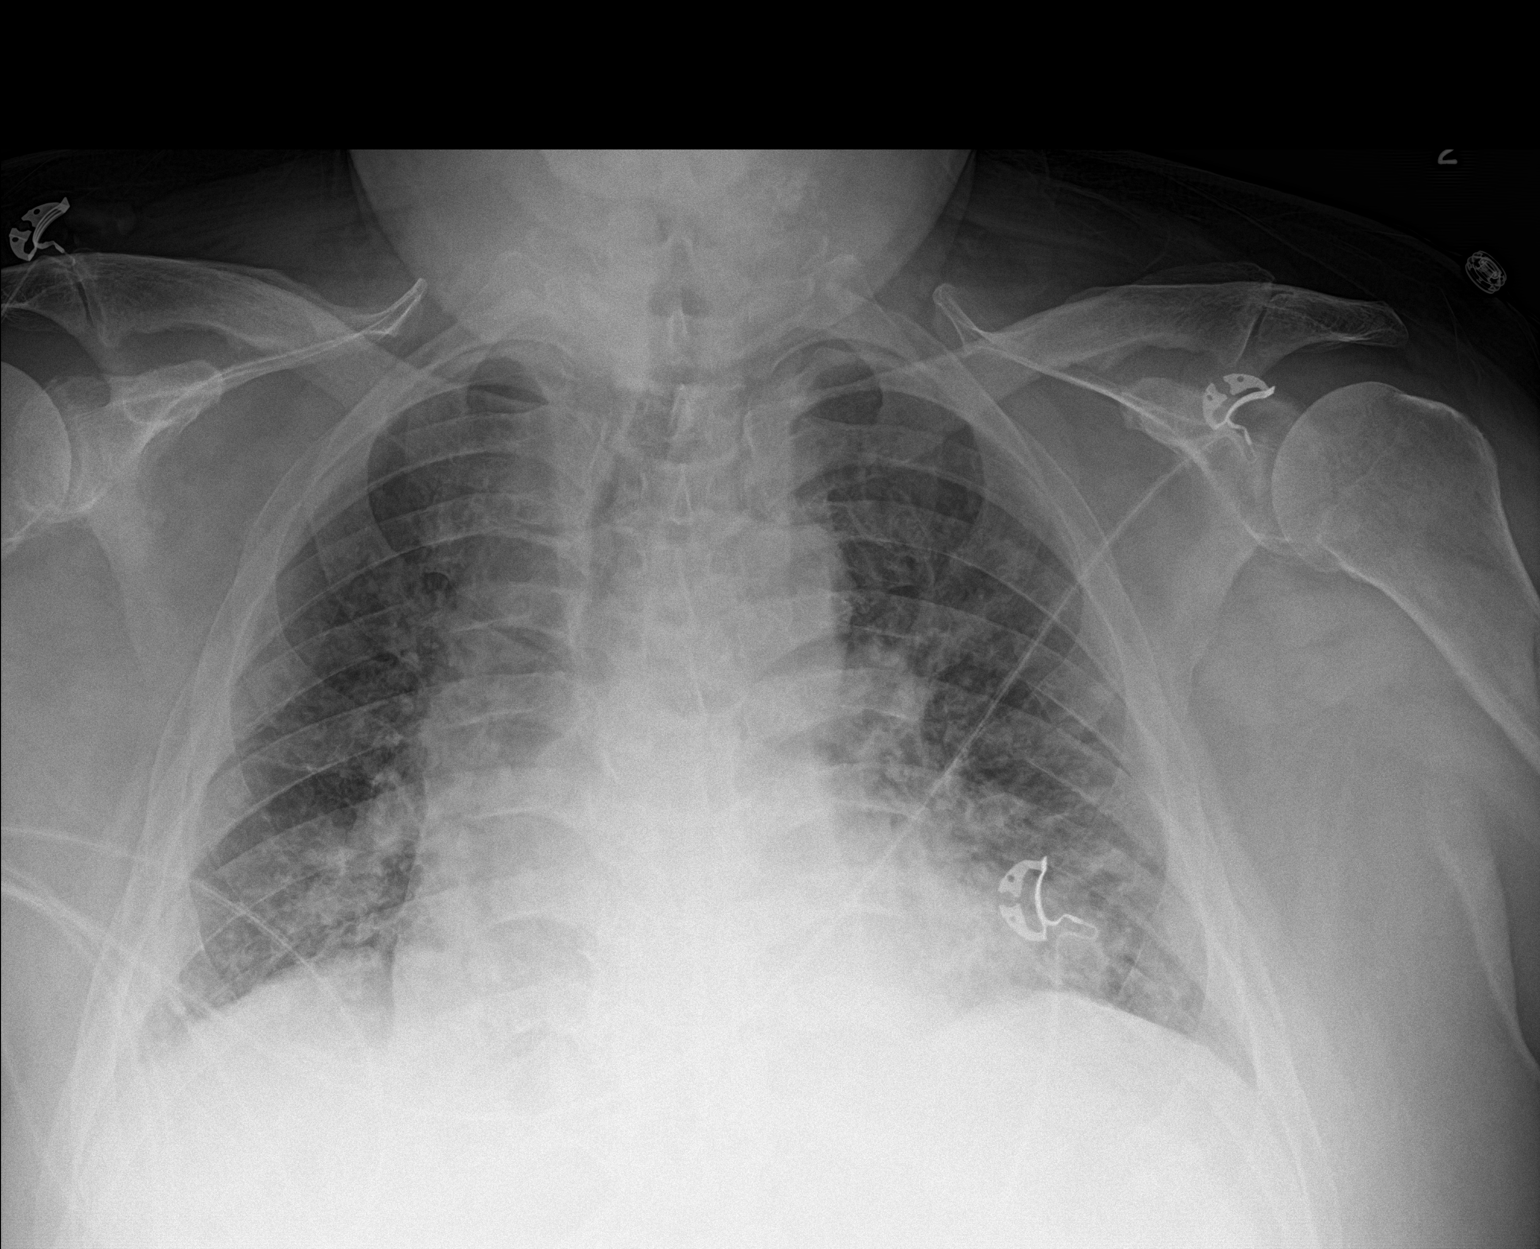

[1 of 1 positions shown; findings below may reference images not displayed]

FINDINGS: Cardiomegaly with vascular congestion and bilateral interstitial/
alveolar opacities compatible with edema/ CHF. No visible effusions.
No acute bony abnormality.
IMPRESSION: Stable edema/CHF

## 2018-11-25 IMAGING — DX DG CHEST 1V PORT
1 series · 1 of 1 positions shown · non-contrast
Comparison: 06/08/2017

CLINICAL DATA: Evaluate for pneumothorax.  Postop

EXAM:
PORTABLE CHEST 1 VIEW

[chest ap]
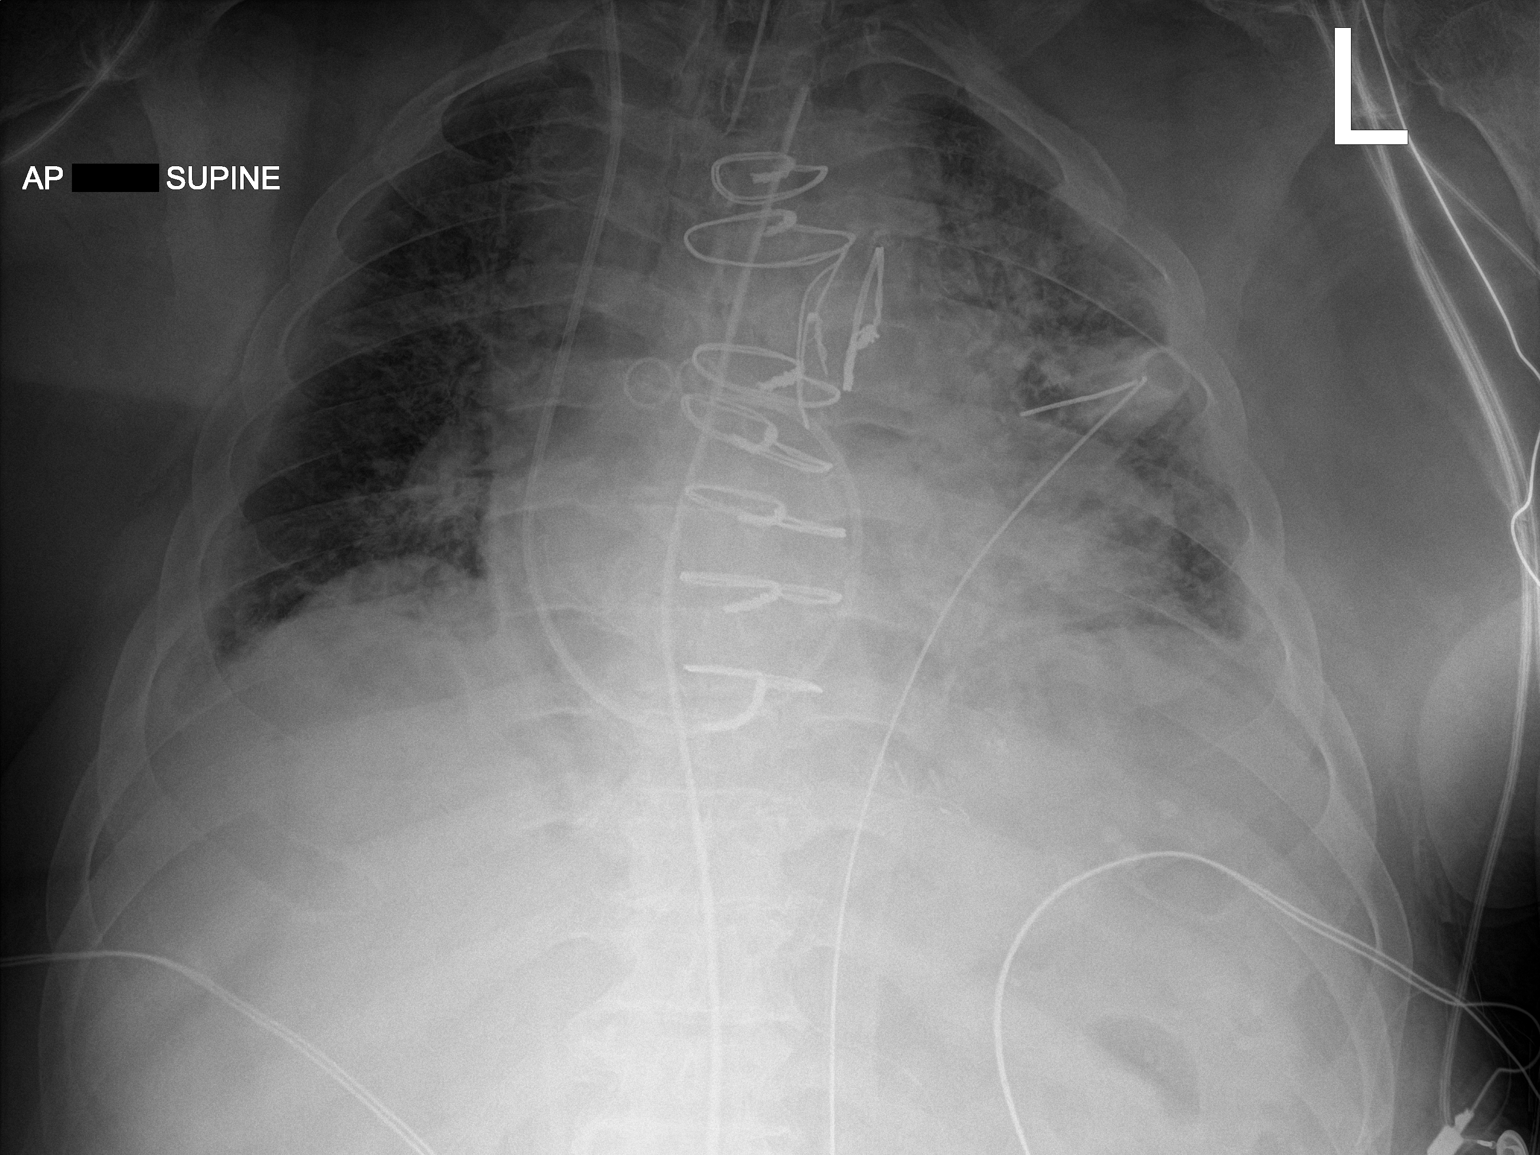

[1 of 1 positions shown; findings below may reference images not displayed]

FINDINGS: Changes of CABG. Swan-Ganz catheter tip in the central right
pulmonary artery. Left chest tube in place without pneumothorax.
Endotracheal tube tip is 4.6 cm above the carina. Cardiomegaly.
Diffuse bilateral airspace disease, left greater than right. Suspect
small effusions. No acute bony abnormality.
IMPRESSION: Postoperative changes.  No pneumothorax.

Bilateral airspace opacities, left greater than right, likely
asymmetric edema.

Small effusions.

## 2018-11-26 IMAGING — DX DG CHEST 1V PORT
1 series · 1 of 1 positions shown · non-contrast
Comparison: 06/09/2017

CLINICAL DATA: Followup CABG

EXAM:
PORTABLE CHEST 1 VIEW

[chest]
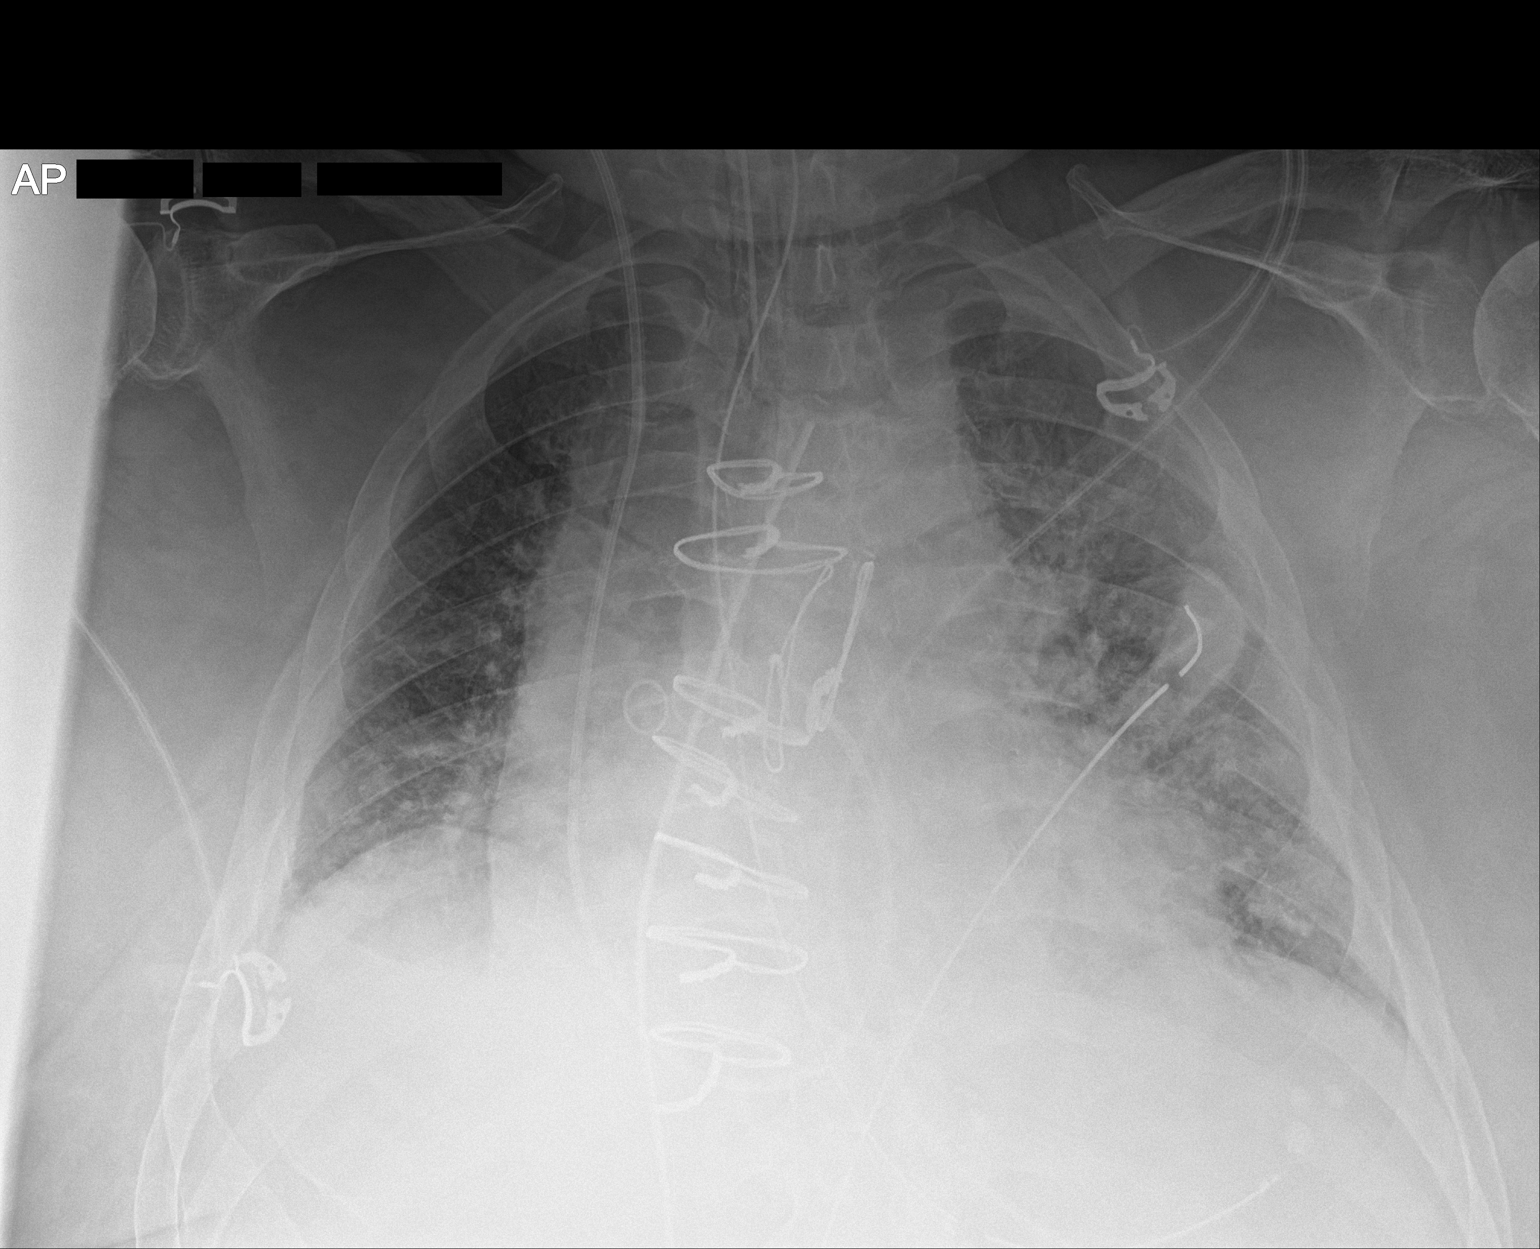

[1 of 1 positions shown; findings below may reference images not displayed]

FINDINGS: Endotracheal tube 6 cm above the carina. Nasogastric tube tip in the
gastric fundus. Swan-Ganz catheter tip in the main pulmonary artery.
Left chest tube remains in place. No pneumothorax. No lobar
collapse. Persistent mild edema pattern. Mediastinal drain in place.
IMPRESSION: Lines and tubes well positioned. No pneumothorax. No lobar collapse.
Persistent mild edema.

## 2018-11-27 IMAGING — DX DG CHEST 1V PORT
1 series · 1 of 1 positions shown · non-contrast
Comparison: 06/11/2017

CLINICAL DATA: ET tube placement

EXAM:
PORTABLE CHEST 1 VIEW

[chest]
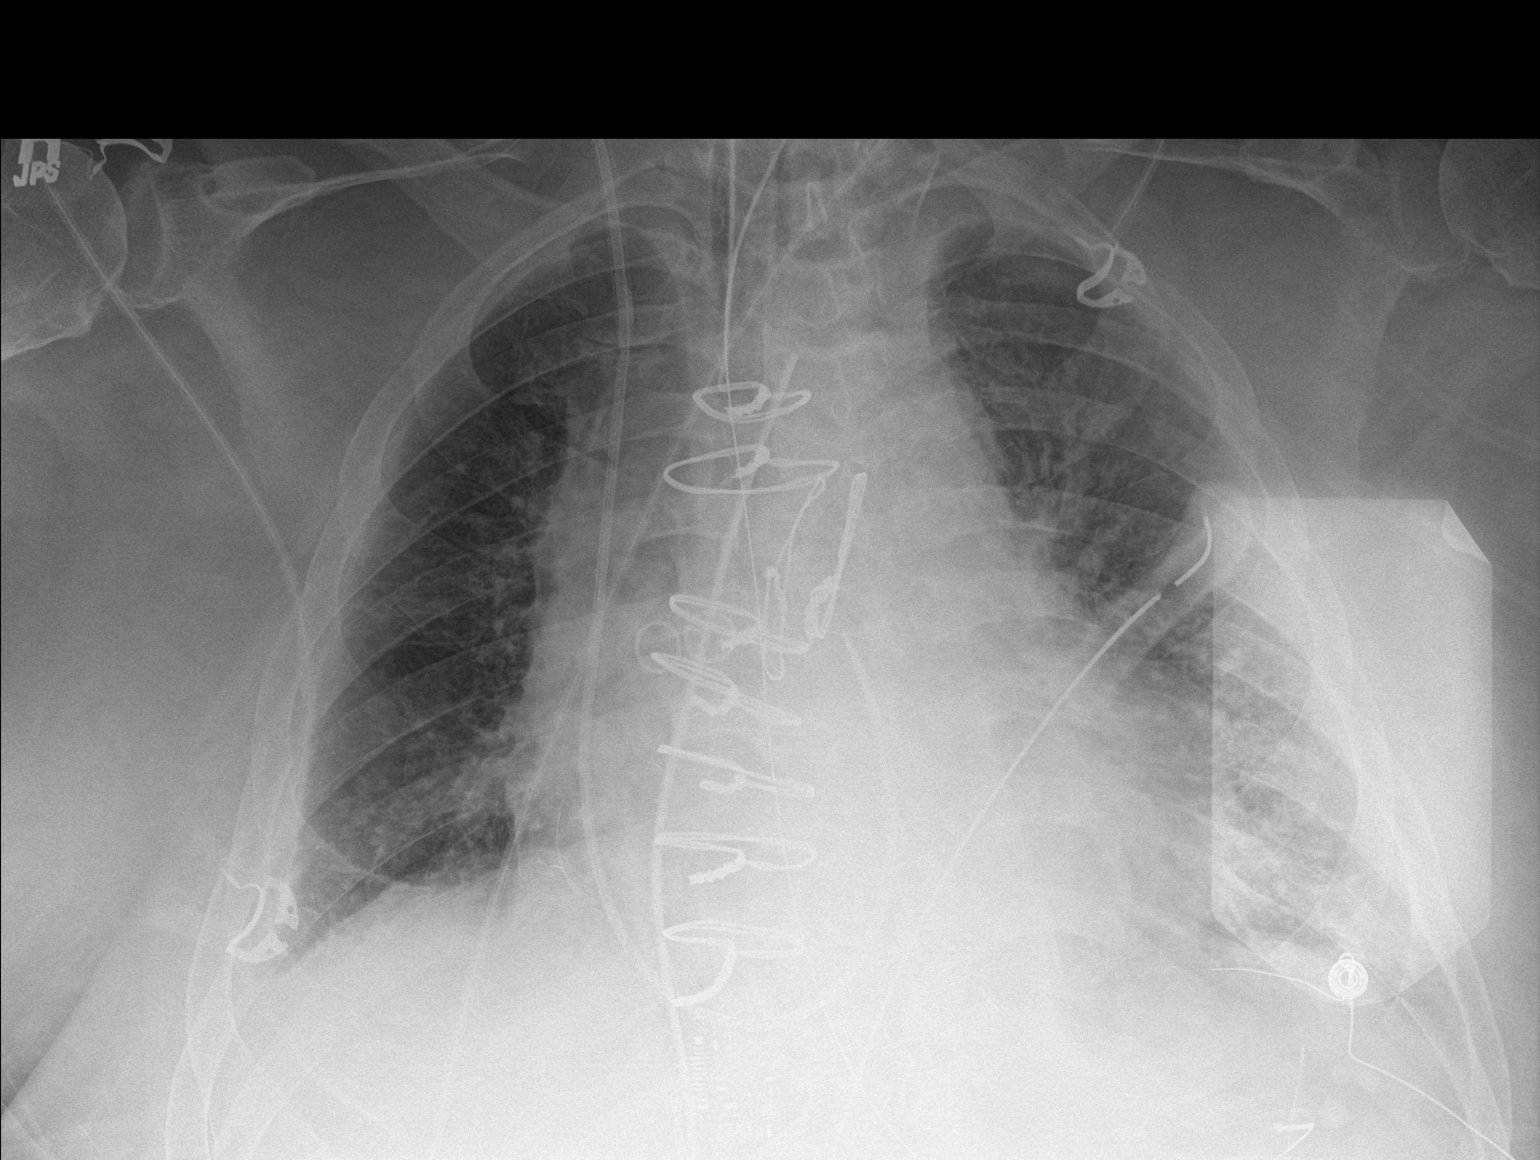

[1 of 1 positions shown; findings below may reference images not displayed]

FINDINGS: Endotracheal tube is 3.5 cm above the carina. Changes of CABG.
Interval placement of NG tube in the stomach. Swan-Ganz catheter tip
is in the main pulmonary artery. Left chest tube is in place without
pneumothorax. Cardiomegaly with vascular congestion and mild
interstitial edema, improved since prior study.
IMPRESSION: Improving interstitial edema pattern.

Endotracheal tube is 3.5 cm above the carina.

No pneumothorax.

## 2018-11-27 IMAGING — DX DG CHEST 1V PORT
1 series · 1 of 1 positions shown · non-contrast
Comparison: 06/10/2017 and 06/05/2017.

CLINICAL DATA: 75-year-old male with shortness breath. Post CABG.
Subsequent encounter.

EXAM:
PORTABLE CHEST 1 VIEW

[chest]
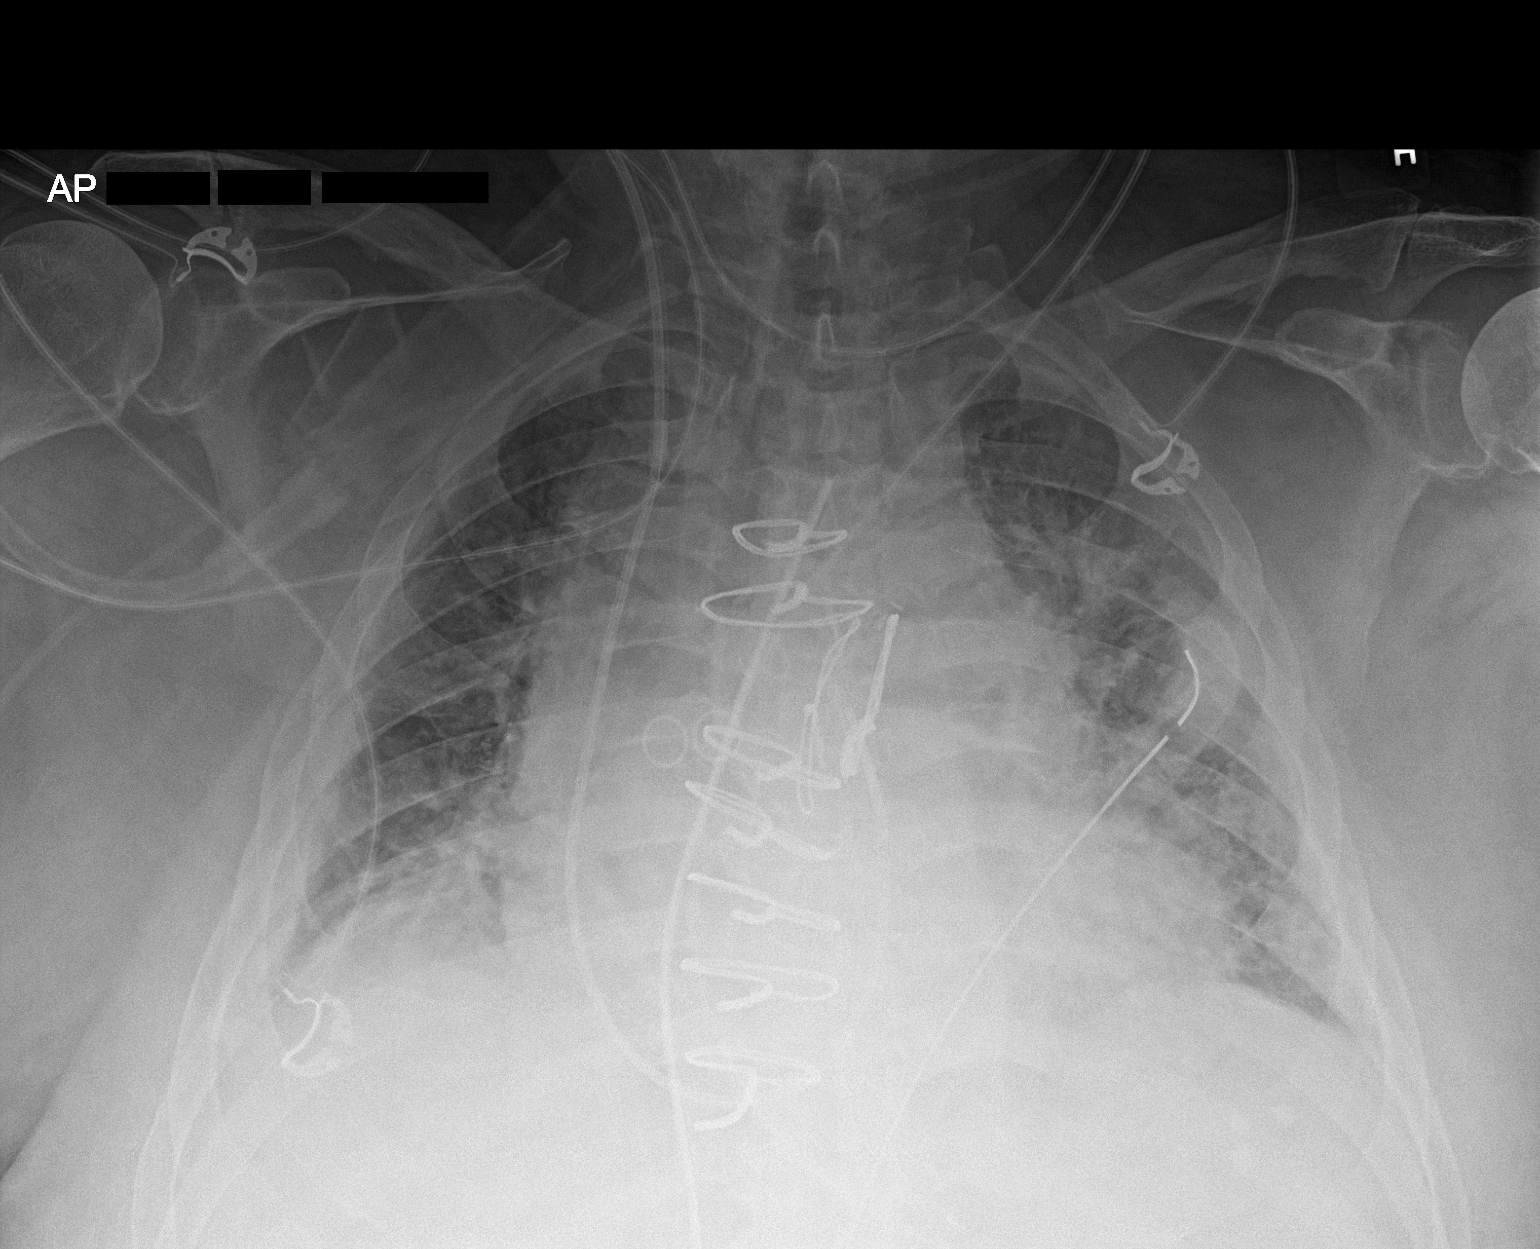

[1 of 1 positions shown; findings below may reference images not displayed]

FINDINGS: Post CABG.  Vertically directed wires next to sternum unchanged.

Cardiomegaly.

Pulmonary vascular congestion superimposed upon chronic lung
changes.

Left chest tube in place without gross pneumothorax.

Swan-Ganz catheter enters from the right with tip at the level of
the main pulmonary artery/pulmonary outflow tract.

Nasogastric tube and endotracheal tube removed.
IMPRESSION: Nasogastric tube and endotracheal tube removed otherwise no
significant change as detailed above.
